# Patient Record
Sex: Female | Born: 1945 | Race: Black or African American | Hispanic: No | Smoking: Never smoker
Health system: Southern US, Community
[De-identification: ages and names within clinical notes are randomized; demographics above are authoritative.]

## PROBLEM LIST (undated history)

## (undated) DIAGNOSIS — Z7729 Contact with and (suspected ) exposure to other hazardous substances: Secondary | ICD-10-CM

## (undated) DIAGNOSIS — I10 Essential (primary) hypertension: Secondary | ICD-10-CM

## (undated) DIAGNOSIS — I4510 Unspecified right bundle-branch block: Secondary | ICD-10-CM

## (undated) DIAGNOSIS — F32A Depression, unspecified: Secondary | ICD-10-CM

## (undated) DIAGNOSIS — F329 Major depressive disorder, single episode, unspecified: Secondary | ICD-10-CM

## (undated) DIAGNOSIS — C539 Malignant neoplasm of cervix uteri, unspecified: Secondary | ICD-10-CM

## (undated) DIAGNOSIS — F419 Anxiety disorder, unspecified: Secondary | ICD-10-CM

## (undated) DIAGNOSIS — R42 Dizziness and giddiness: Secondary | ICD-10-CM

## (undated) HISTORY — DX: Essential (primary) hypertension: I10

## (undated) HISTORY — DX: Depression, unspecified: F32.A

## (undated) HISTORY — PX: WISDOM TOOTH EXTRACTION: SHX21

## (undated) HISTORY — DX: Contact with and (suspected) exposure to other hazardous substances: Z77.29

## (undated) HISTORY — DX: Major depressive disorder, single episode, unspecified: F32.9

## (undated) HISTORY — DX: Malignant neoplasm of cervix uteri, unspecified: C53.9

## (undated) HISTORY — DX: Unspecified right bundle-branch block: I45.10

## (undated) HISTORY — PX: ABDOMINAL HYSTERECTOMY: SHX81

## (undated) HISTORY — DX: Dizziness and giddiness: R42

---

## 2000-05-03 ENCOUNTER — Encounter: Payer: Self-pay | Admitting: Emergency Medicine

## 2000-05-03 ENCOUNTER — Emergency Department (HOSPITAL_COMMUNITY): Admission: EM | Admit: 2000-05-03 | Discharge: 2000-05-03 | Payer: Self-pay | Admitting: Emergency Medicine

## 2000-05-05 ENCOUNTER — Encounter: Admission: RE | Admit: 2000-05-05 | Discharge: 2000-05-05 | Payer: Self-pay | Admitting: Hematology and Oncology

## 2000-05-08 ENCOUNTER — Emergency Department (HOSPITAL_COMMUNITY): Admission: EM | Admit: 2000-05-08 | Discharge: 2000-05-08 | Payer: Self-pay | Admitting: *Deleted

## 2000-05-08 ENCOUNTER — Encounter: Payer: Self-pay | Admitting: Emergency Medicine

## 2000-06-01 ENCOUNTER — Encounter: Admission: RE | Admit: 2000-06-01 | Discharge: 2000-06-01 | Payer: Self-pay | Admitting: Internal Medicine

## 2000-06-03 ENCOUNTER — Ambulatory Visit (HOSPITAL_COMMUNITY): Admission: RE | Admit: 2000-06-03 | Discharge: 2000-06-03 | Payer: Self-pay | Admitting: *Deleted

## 2000-06-27 ENCOUNTER — Encounter: Admission: RE | Admit: 2000-06-27 | Discharge: 2000-06-27 | Payer: Self-pay | Admitting: Internal Medicine

## 2000-07-22 ENCOUNTER — Emergency Department (HOSPITAL_COMMUNITY): Admission: EM | Admit: 2000-07-22 | Discharge: 2000-07-22 | Payer: Self-pay | Admitting: Emergency Medicine

## 2000-08-26 ENCOUNTER — Encounter: Admission: RE | Admit: 2000-08-26 | Discharge: 2000-08-26 | Payer: Self-pay | Admitting: Internal Medicine

## 2000-09-14 ENCOUNTER — Encounter: Admission: RE | Admit: 2000-09-14 | Discharge: 2000-09-14 | Payer: Self-pay | Admitting: Internal Medicine

## 2000-09-14 ENCOUNTER — Ambulatory Visit (HOSPITAL_COMMUNITY): Admission: RE | Admit: 2000-09-14 | Discharge: 2000-09-14 | Payer: Self-pay | Admitting: Internal Medicine

## 2000-09-19 ENCOUNTER — Emergency Department (HOSPITAL_COMMUNITY): Admission: EM | Admit: 2000-09-19 | Discharge: 2000-09-19 | Payer: Self-pay | Admitting: Emergency Medicine

## 2000-09-19 ENCOUNTER — Encounter: Payer: Self-pay | Admitting: *Deleted

## 2000-10-18 ENCOUNTER — Encounter: Admission: RE | Admit: 2000-10-18 | Discharge: 2000-10-18 | Payer: Self-pay | Admitting: Obstetrics & Gynecology

## 2000-10-20 ENCOUNTER — Ambulatory Visit (HOSPITAL_COMMUNITY): Admission: RE | Admit: 2000-10-20 | Discharge: 2000-10-20 | Payer: Self-pay | Admitting: Internal Medicine

## 2000-11-23 ENCOUNTER — Encounter: Admission: RE | Admit: 2000-11-23 | Discharge: 2000-11-23 | Payer: Self-pay

## 2000-12-19 ENCOUNTER — Encounter: Admission: RE | Admit: 2000-12-19 | Discharge: 2000-12-19 | Payer: Self-pay | Admitting: Internal Medicine

## 2001-04-19 ENCOUNTER — Encounter: Admission: RE | Admit: 2001-04-19 | Discharge: 2001-04-19 | Payer: Self-pay | Admitting: Internal Medicine

## 2001-05-11 ENCOUNTER — Encounter: Admission: RE | Admit: 2001-05-11 | Discharge: 2001-05-11 | Payer: Self-pay | Admitting: Internal Medicine

## 2001-06-02 ENCOUNTER — Encounter: Admission: RE | Admit: 2001-06-02 | Discharge: 2001-06-02 | Payer: Self-pay | Admitting: Internal Medicine

## 2001-06-09 ENCOUNTER — Ambulatory Visit (HOSPITAL_COMMUNITY): Admission: RE | Admit: 2001-06-09 | Discharge: 2001-06-09 | Payer: Self-pay | Admitting: Internal Medicine

## 2001-09-28 ENCOUNTER — Encounter: Admission: RE | Admit: 2001-09-28 | Discharge: 2001-09-28 | Payer: Self-pay | Admitting: Internal Medicine

## 2001-10-19 ENCOUNTER — Encounter: Admission: RE | Admit: 2001-10-19 | Discharge: 2001-10-19 | Payer: Self-pay | Admitting: Internal Medicine

## 2002-01-17 ENCOUNTER — Encounter: Admission: RE | Admit: 2002-01-17 | Discharge: 2002-01-17 | Payer: Self-pay | Admitting: Internal Medicine

## 2002-05-24 ENCOUNTER — Encounter: Admission: RE | Admit: 2002-05-24 | Discharge: 2002-05-24 | Payer: Self-pay | Admitting: Internal Medicine

## 2002-05-24 ENCOUNTER — Encounter: Payer: Self-pay | Admitting: Cardiology

## 2002-05-24 ENCOUNTER — Observation Stay (HOSPITAL_COMMUNITY): Admission: AD | Admit: 2002-05-24 | Discharge: 2002-05-25 | Payer: Self-pay | Admitting: Internal Medicine

## 2002-05-25 ENCOUNTER — Encounter: Payer: Self-pay | Admitting: Cardiology

## 2002-10-19 ENCOUNTER — Encounter: Admission: RE | Admit: 2002-10-19 | Discharge: 2002-10-19 | Payer: Self-pay | Admitting: Internal Medicine

## 2002-10-25 ENCOUNTER — Emergency Department (HOSPITAL_COMMUNITY): Admission: EM | Admit: 2002-10-25 | Discharge: 2002-10-25 | Payer: Self-pay | Admitting: Emergency Medicine

## 2002-10-31 ENCOUNTER — Encounter: Payer: Self-pay | Admitting: Internal Medicine

## 2002-10-31 ENCOUNTER — Encounter: Admission: RE | Admit: 2002-10-31 | Discharge: 2002-10-31 | Payer: Self-pay | Admitting: Internal Medicine

## 2002-10-31 ENCOUNTER — Ambulatory Visit (HOSPITAL_COMMUNITY): Admission: RE | Admit: 2002-10-31 | Discharge: 2002-10-31 | Payer: Self-pay | Admitting: Internal Medicine

## 2003-01-30 ENCOUNTER — Encounter: Admission: RE | Admit: 2003-01-30 | Discharge: 2003-01-30 | Payer: Self-pay | Admitting: Internal Medicine

## 2003-04-11 ENCOUNTER — Emergency Department (HOSPITAL_COMMUNITY): Admission: EM | Admit: 2003-04-11 | Discharge: 2003-04-11 | Payer: Self-pay | Admitting: Emergency Medicine

## 2003-05-09 ENCOUNTER — Encounter: Admission: RE | Admit: 2003-05-09 | Discharge: 2003-05-09 | Payer: Self-pay | Admitting: Internal Medicine

## 2003-07-03 ENCOUNTER — Encounter: Admission: RE | Admit: 2003-07-03 | Discharge: 2003-07-03 | Payer: Self-pay | Admitting: Internal Medicine

## 2003-07-18 ENCOUNTER — Encounter: Admission: RE | Admit: 2003-07-18 | Discharge: 2003-07-18 | Payer: Self-pay | Admitting: Internal Medicine

## 2003-07-25 ENCOUNTER — Ambulatory Visit (HOSPITAL_COMMUNITY): Admission: RE | Admit: 2003-07-25 | Discharge: 2003-07-25 | Payer: Self-pay | Admitting: Internal Medicine

## 2003-08-20 ENCOUNTER — Encounter: Admission: RE | Admit: 2003-08-20 | Discharge: 2003-08-20 | Payer: Self-pay | Admitting: Internal Medicine

## 2003-09-09 ENCOUNTER — Encounter: Admission: RE | Admit: 2003-09-09 | Discharge: 2003-09-09 | Payer: Self-pay | Admitting: Internal Medicine

## 2003-12-30 ENCOUNTER — Encounter: Admission: RE | Admit: 2003-12-30 | Discharge: 2003-12-30 | Payer: Self-pay | Admitting: Internal Medicine

## 2004-06-08 ENCOUNTER — Encounter: Admission: RE | Admit: 2004-06-08 | Discharge: 2004-06-08 | Payer: Self-pay | Admitting: Internal Medicine

## 2004-06-10 ENCOUNTER — Encounter: Admission: RE | Admit: 2004-06-10 | Discharge: 2004-06-10 | Payer: Self-pay | Admitting: Internal Medicine

## 2004-06-24 ENCOUNTER — Ambulatory Visit: Payer: Self-pay | Admitting: Internal Medicine

## 2004-08-13 ENCOUNTER — Emergency Department (HOSPITAL_COMMUNITY): Admission: EM | Admit: 2004-08-13 | Discharge: 2004-08-13 | Payer: Self-pay | Admitting: Emergency Medicine

## 2004-08-19 ENCOUNTER — Ambulatory Visit: Payer: Self-pay | Admitting: Family Medicine

## 2004-08-20 ENCOUNTER — Ambulatory Visit: Payer: Self-pay | Admitting: Family Medicine

## 2004-09-10 ENCOUNTER — Ambulatory Visit: Payer: Self-pay | Admitting: Family Medicine

## 2004-09-22 ENCOUNTER — Ambulatory Visit (HOSPITAL_COMMUNITY): Admission: RE | Admit: 2004-09-22 | Discharge: 2004-09-22 | Payer: Self-pay | Admitting: Internal Medicine

## 2004-09-22 ENCOUNTER — Ambulatory Visit: Payer: Self-pay | Admitting: *Deleted

## 2004-10-01 ENCOUNTER — Ambulatory Visit: Payer: Self-pay | Admitting: Family Medicine

## 2004-10-22 ENCOUNTER — Ambulatory Visit: Payer: Self-pay | Admitting: Family Medicine

## 2004-11-03 ENCOUNTER — Ambulatory Visit: Payer: Self-pay | Admitting: Internal Medicine

## 2004-12-08 ENCOUNTER — Ambulatory Visit: Payer: Self-pay | Admitting: Internal Medicine

## 2005-04-20 ENCOUNTER — Ambulatory Visit: Payer: Self-pay | Admitting: Internal Medicine

## 2005-05-21 ENCOUNTER — Ambulatory Visit (HOSPITAL_COMMUNITY): Admission: RE | Admit: 2005-05-21 | Discharge: 2005-05-21 | Payer: Self-pay | Admitting: Family Medicine

## 2005-05-21 ENCOUNTER — Ambulatory Visit: Payer: Self-pay | Admitting: Family Medicine

## 2005-06-10 ENCOUNTER — Ambulatory Visit: Payer: Self-pay | Admitting: Family Medicine

## 2005-06-18 ENCOUNTER — Ambulatory Visit: Payer: Self-pay | Admitting: Internal Medicine

## 2005-07-30 ENCOUNTER — Ambulatory Visit: Payer: Self-pay | Admitting: Internal Medicine

## 2005-08-09 ENCOUNTER — Ambulatory Visit: Payer: Self-pay | Admitting: Internal Medicine

## 2005-09-28 ENCOUNTER — Ambulatory Visit (HOSPITAL_COMMUNITY): Admission: RE | Admit: 2005-09-28 | Discharge: 2005-09-28 | Payer: Self-pay | Admitting: Family Medicine

## 2005-10-27 ENCOUNTER — Ambulatory Visit: Payer: Self-pay | Admitting: Family Medicine

## 2005-11-08 ENCOUNTER — Ambulatory Visit: Payer: Self-pay | Admitting: Family Medicine

## 2005-11-08 ENCOUNTER — Emergency Department (HOSPITAL_COMMUNITY): Admission: EM | Admit: 2005-11-08 | Discharge: 2005-11-08 | Payer: Self-pay | Admitting: Emergency Medicine

## 2005-11-18 ENCOUNTER — Emergency Department (HOSPITAL_COMMUNITY): Admission: EM | Admit: 2005-11-18 | Discharge: 2005-11-18 | Payer: Self-pay | Admitting: Emergency Medicine

## 2005-11-22 ENCOUNTER — Emergency Department (HOSPITAL_COMMUNITY): Admission: EM | Admit: 2005-11-22 | Discharge: 2005-11-22 | Payer: Self-pay | Admitting: Family Medicine

## 2005-12-06 ENCOUNTER — Emergency Department (HOSPITAL_COMMUNITY): Admission: EM | Admit: 2005-12-06 | Discharge: 2005-12-06 | Payer: Self-pay | Admitting: Family Medicine

## 2005-12-07 ENCOUNTER — Ambulatory Visit: Payer: Self-pay | Admitting: Family Medicine

## 2006-10-13 ENCOUNTER — Ambulatory Visit (HOSPITAL_COMMUNITY): Admission: RE | Admit: 2006-10-13 | Discharge: 2006-10-13 | Payer: Self-pay | Admitting: Family Medicine

## 2007-06-26 DIAGNOSIS — E785 Hyperlipidemia, unspecified: Secondary | ICD-10-CM | POA: Insufficient documentation

## 2007-06-26 DIAGNOSIS — Z8679 Personal history of other diseases of the circulatory system: Secondary | ICD-10-CM | POA: Insufficient documentation

## 2007-06-26 DIAGNOSIS — Z9079 Acquired absence of other genital organ(s): Secondary | ICD-10-CM | POA: Insufficient documentation

## 2007-06-26 HISTORY — DX: Hyperlipidemia, unspecified: E78.5

## 2007-07-16 DIAGNOSIS — I10 Essential (primary) hypertension: Secondary | ICD-10-CM

## 2007-07-16 DIAGNOSIS — J309 Allergic rhinitis, unspecified: Secondary | ICD-10-CM | POA: Insufficient documentation

## 2007-07-16 DIAGNOSIS — F419 Anxiety disorder, unspecified: Secondary | ICD-10-CM

## 2007-07-16 DIAGNOSIS — F329 Major depressive disorder, single episode, unspecified: Secondary | ICD-10-CM

## 2007-07-16 DIAGNOSIS — F32A Depression, unspecified: Secondary | ICD-10-CM | POA: Insufficient documentation

## 2007-07-16 HISTORY — DX: Essential (primary) hypertension: I10

## 2007-07-16 HISTORY — DX: Allergic rhinitis, unspecified: J30.9

## 2007-07-16 HISTORY — DX: Major depressive disorder, single episode, unspecified: F32.9

## 2007-09-18 ENCOUNTER — Emergency Department (HOSPITAL_COMMUNITY): Admission: EM | Admit: 2007-09-18 | Discharge: 2007-09-18 | Payer: Self-pay | Admitting: Family Medicine

## 2007-12-29 ENCOUNTER — Ambulatory Visit (HOSPITAL_COMMUNITY): Admission: RE | Admit: 2007-12-29 | Discharge: 2007-12-29 | Payer: Self-pay | Admitting: Family Medicine

## 2009-02-11 ENCOUNTER — Emergency Department (HOSPITAL_COMMUNITY): Admission: EM | Admit: 2009-02-11 | Discharge: 2009-02-11 | Payer: Self-pay | Admitting: Family Medicine

## 2009-10-15 ENCOUNTER — Emergency Department (HOSPITAL_COMMUNITY): Admission: EM | Admit: 2009-10-15 | Discharge: 2009-10-15 | Payer: Self-pay | Admitting: Family Medicine

## 2009-12-03 ENCOUNTER — Emergency Department (HOSPITAL_BASED_OUTPATIENT_CLINIC_OR_DEPARTMENT_OTHER): Admission: EM | Admit: 2009-12-03 | Discharge: 2009-12-03 | Payer: Self-pay | Admitting: Emergency Medicine

## 2009-12-11 ENCOUNTER — Encounter (INDEPENDENT_AMBULATORY_CARE_PROVIDER_SITE_OTHER): Payer: Self-pay | Admitting: *Deleted

## 2009-12-15 ENCOUNTER — Encounter: Admission: RE | Admit: 2009-12-15 | Discharge: 2009-12-15 | Payer: Self-pay | Admitting: Internal Medicine

## 2010-01-07 ENCOUNTER — Emergency Department (HOSPITAL_COMMUNITY): Admission: EM | Admit: 2010-01-07 | Discharge: 2010-01-07 | Payer: Self-pay | Admitting: Family Medicine

## 2010-08-27 ENCOUNTER — Ambulatory Visit (HOSPITAL_COMMUNITY): Admission: RE | Admit: 2010-08-27 | Discharge: 2010-08-27 | Payer: Self-pay | Admitting: Internal Medicine

## 2010-11-10 NOTE — Letter (Signed)
Summary: New Patient letter  [facility]  [address] [place], [place] [address]   Phone: [phone]  Fax: [phone]       12/11/2009 MRN: [id number]  [facility] [address] [place], [place]  [address]  Dear Ms. Till,  Welcome to the Gastroenterology Division at [facility].    You are scheduled to see Dr. Jarold Motto on 01/09/2010 at 8:30AM on the 3rd floor at [facility], [address]  We ask that you try to arrive at our office 15 minutes prior to your appointment time to allow for check-in.  We would like you to complete the enclosed self-administered evaluation form prior to your visit and bring it with you on the day of your appointment.  We will review it with you.  Also, please bring a complete list of all your medications or, if you prefer, bring the medication bottles and we will list them.  Please bring your insurance card so that we may make a copy of it.  If your insurance requires a referral to see a specialist, please bring your referral form from your primary care physician.  Co-payments are due at the time of your visit and may be paid by cash, check or credit card.     Your office visit will consist of a consult with your physician (includes a physical exam), any laboratory testing he/she may order, scheduling of any necessary diagnostic testing (e.g. x-ray, ultrasound, CT-scan), and scheduling of a procedure (e.g. Endoscopy, Colonoscopy) if required.  Please allow enough time on your schedule to allow for any/all of these possibilities.    If you cannot keep your appointment, please call [phone] to cancel or reschedule prior to your appointment date.  This allows [name] the opportunity to schedule an appointment for another patient in need of care.  If you do not cancel or reschedule by 5 p.m. the business day prior to your appointment date, you will be charged a $50.00 late cancellation/no-show fee.    Thank you for choosing  [facility] for your medical needs.  We appreciate the opportunity to care for you.  Please visit [name] at our website  to learn more about our practice.                     Sincerely,                                                             The Gastroenterology Division

## 2010-12-30 ENCOUNTER — Other Ambulatory Visit: Payer: Self-pay | Admitting: Occupational Medicine

## 2010-12-30 ENCOUNTER — Ambulatory Visit: Payer: Self-pay

## 2010-12-30 DIAGNOSIS — R52 Pain, unspecified: Secondary | ICD-10-CM

## 2011-01-19 LAB — POCT I-STAT, CHEM 8
BUN: 13 mg/dL (ref 6–23)
Calcium, Ion: 1.23 mmol/L (ref 1.12–1.32)
Chloride: 105 mEq/L (ref 96–112)
Creatinine, Ser: 1.2 mg/dL (ref 0.4–1.2)
Glucose, Bld: 91 mg/dL (ref 70–99)
HCT: 39 % (ref 36.0–46.0)
Hemoglobin: 13.3 g/dL (ref 12.0–15.0)
Potassium: 3.8 mEq/L (ref 3.5–5.1)
Sodium: 140 mEq/L (ref 135–145)
TCO2: 26 mmol/L (ref 0–100)

## 2011-01-19 LAB — TSH: TSH: 1.081 u[IU]/mL (ref 0.350–4.500)

## 2011-02-26 NOTE — Discharge Summary (Signed)
NAMEARVELLA, Margaret Torres                         ACCOUNT NO.:  [id number]   MEDICAL RECORD NO.:  [id number]                   PATIENT TYPE:  INP   LOCATION:  2017                                 FACILITY:  [facility]   PHYSICIAN:  Madaline Guthrie, M.D.                 DATE OF BIRTH:  September 20, 1946   DATE OF ADMISSION:  05/24/2002  DATE OF DISCHARGE:  05/25/2002                                 DISCHARGE SUMMARY   HOME ADDRESS:  [address] [place], [place] [address].  Phone  [phone].   DISCHARGE DIAGNOSIS:  Gastroesophageal reflux disease.   ADMISSION DIAGNOSES:  1. Chest pain.  2. Hypertension.  3. Gastroesophageal reflux disease.   DISCHARGE MEDICATIONS:  1. Protonix one p.o. q.d.  2. Amitriptyline one p.o. q.h.s.  3. Xanax one p.o. q.12h. p.r.n.  4. Nitroglycerin tablets one p.o. q.50m. x3 p.r.n.   FOLLOWUP APPOINTMENTS:  The patient scheduled for followup for Cardiolite on  Monday, June 04, 2002 at 9:30 a.m. at [facility].  The patient  also scheduled for followup with Dr. Bettye Boeck July 06, 2002 at 2 p.m.   CONDITION ON DISCHARGE:  The patient's condition good at discharge.   PROCEDURE:  The patient had a cardiac echo on May 25, 2002.  This echo  showed normal left ventricular systolic function with left ejection fraction  estimated at 55-65%.  It also showed mitral regurgitation.   CHIEF COMPLAINT:  Chest pain.   HISTORY OF PRESENT ILLNESS:  The patient is a 65 year old African-American  female with a 17-year of hypertension that came into the [facility] today  complaining of fatigue for over 1 week and sharp chest pain after exertion  relieved by rest that started yesterday.  The pain radiated to her left arm  and was associated with diaphoresis.  No nausea, no vomiting.  She says also  that for the past week she felt abdominal pain that was worse with food  intake, associated with bloating, not relieved by proton pump inhibitors.  Denies any  diarrhea or constipation.   ALLERGIES:  No known drug allergies.   MEDICATIONS:  1. Hydrochlorothiazide 25 mg p.o. q.d.  2. Aciphex 20 mg p.o. p.r.n.  3. Paxil 20 mg p.r.n.  4. Ativan 5 mg p.r.n.  5. Liquid Kay Ciel.   PAST MEDICAL HISTORY:  1. Hypertension.  2. Anxiety.  3. Total hysterectomy/bilateral oophorectomy in 1987 due to cervical cancer     in situ.  4. Depression.  5. GERD.  6. History of benzodiazepine abuse.   SOCIAL HISTORY:  The patient denies tobacco, alcohol, cocaine, other drugs.  She is widowed and divorced.  She works part time in [employer] and she  lives alone currently.  Her dog ran away 2 weeks ago.   FAMILY HISTORY:  Mother died of cervical cancer at 74.  Father died from  crime.  Siblings six brothers, all have hypertension.  She  has four  children, all of whom are healthy.   REVIEW OF SYSTEMS:  Positive for recent fatigue, weight gain, chest pain,  palpitations, hematochezia, epigastric pain, bloating, dizziness, depression  and anxiety.  Denies fevers, sore throat, vision changes, dyspnea, edema,  syncope, sputum, wheezing, nausea, vomiting, constipation, diarrhea,  dysphagia, dysuria, hematuria, polyuria, polydipsia, joint pain, weakness,  numbness, headache, blurry vision, suicidal ideation.   PHYSICAL EXAMINATION:  VITAL SIGNS: Temperature 98.1, BP 132/76, pulse 56,  respirations 16, O2 saturation 100%.  GENERAL: Pleasant, lying in bed, very anxious and oriented.  EYES: Pupils equal, round, reactive to light.  Extraocular muscles intact.  ENT: No nasal discharge.  No oropharyngeal erythema.  NECK: No JVD.  No carotid bruits.  RESPIRATORY: Clear to auscultation bilaterally.  CARDIOVASCULAR: Regular rate and rhythm, S1, S2 normal; no murmurs.  GI: Obese abdomen, bowel sounds positive, nontender.  EXTREMITIES: No edema, pulses 2+.  GU: No vaginal discharge.  SKIN: No rash.  LYMPHATICS: No lymphadenopathy.  MUSCULOSKELETAL: Intact.   NEUROLOGIC: Cranial nerves II-XII okay.  Normal strength.  PSYCHIATRIC: No hallucinations, no delusions, but clearly anxious and  depressed.   LABORATORIES:  Cardiac enzymes negative.  EKG showed sinus bradycardia,  negative T waves in V1-V4, stable from earlier EKG in 2001.  Urine drug  screen was negative.  Urinalysis positive for blood, 7-10 white blood cells,  rare bacteria.  Chem-7: 139, 3.1, 105, 28, 14, 0.8, 131.  CBC showed 6.3  white cells, 301 platelets, 11.9 hemoglobin, 35.8 hematocrit, MCV is 89.4.  Anion gap was 6, calcium 9.4, protein 6.6, albumin 3.7, bilirubin 0.9, alk  phos 54, SGOT 20, SGPT 22.   ASSESSMENT AND PLAN:  1. Chest discomfort.  Possible angina versus gastritis, gastroesophageal     reflux disease versus anxiety versus parietal pain.  No acute EKG     changes.  Negative set of enzymes.  Probability of having significant     coronary artery disease is 15% given her age, sex, normal lipids and the     fact that she is not a smoker.  Symptoms improved with gastrointestinal     cocktail.  We will continue to monitor and watch for a recurrence.  She     will benefit from chronic SSIR use.  2. Hypertension.  Continue hydrochlorothiazide and add in metoprolol.  We     will monitor blood pressure levels.  3. Abdominal pain with bloating.  Might be gastritis, biliary causes.  She     is at high risk for having gallstones.  We will consider ultrasound     abdomen.  We will check stool for occult blood, if positive consider     outpatient EGD.  4. Weakness.  Possible causes decreased potassium, we will check TSH,     depression, chronic fatigue, PMR.  5. Decreased potassium secondary to hydrochlorothiazide.  We will replete.  6. We will recheck her lipid panel.  7. Mild normocytic anemia.  We will check stool for occult blood.  She will     need outpatient colonoscopy.  8. Obesity.  Encouraged to loose weight, diet counseling, group support.   HOSPITAL COURSE: 1.  Chest pain.  Pain mostly resolved without any radiation of the pain from     the substernal region by the next day.  The patient described the pain as     a burning sensation running the length of her esophagus most likely due     to GERD and will be treated outpatient  PPI.  2. Hypertension.  Blood pressure is stable as inpatient.  3. Abdominal pain and bloating secondary to problem #1.  We will treat with     PPI as outpatient.  4. Depression/anxiety.  The patient unable to take SSRI as outpatient due to     cost.  We have sent her home with amitriptyline.   DISCHARGE VITALS:  Blood pressure 132/76, temperature 98.1, respirations 18,  pulse of 64.   PENDING LABORATORIES:  No labs were pending at time of discharge.     Ilda Basset, MS-IV                          Madaline Guthrie, M.D.    JZ/MEDQ  D:  05/26/2002  T:  05/30/2002  Job:  [id number]   cc:   [facility]

## 2011-03-16 ENCOUNTER — Ambulatory Visit: Payer: Worker's Compensation | Attending: Occupational Medicine | Admitting: Occupational Therapy

## 2011-03-16 DIAGNOSIS — R279 Unspecified lack of coordination: Secondary | ICD-10-CM | POA: Insufficient documentation

## 2011-03-16 DIAGNOSIS — M6281 Muscle weakness (generalized): Secondary | ICD-10-CM | POA: Insufficient documentation

## 2011-03-16 DIAGNOSIS — IMO0001 Reserved for inherently not codable concepts without codable children: Secondary | ICD-10-CM | POA: Insufficient documentation

## 2011-03-18 ENCOUNTER — Ambulatory Visit: Payer: Worker's Compensation | Admitting: Occupational Therapy

## 2011-03-24 ENCOUNTER — Ambulatory Visit: Payer: Worker's Compensation | Admitting: Occupational Therapy

## 2011-03-29 ENCOUNTER — Ambulatory Visit: Payer: Worker's Compensation | Attending: Family Medicine | Admitting: Occupational Therapy

## 2011-03-29 DIAGNOSIS — IMO0001 Reserved for inherently not codable concepts without codable children: Secondary | ICD-10-CM | POA: Insufficient documentation

## 2011-03-29 DIAGNOSIS — M6281 Muscle weakness (generalized): Secondary | ICD-10-CM | POA: Insufficient documentation

## 2011-03-29 DIAGNOSIS — R279 Unspecified lack of coordination: Secondary | ICD-10-CM | POA: Insufficient documentation

## 2011-03-30 ENCOUNTER — Ambulatory Visit: Payer: Worker's Compensation | Admitting: Occupational Therapy

## 2011-04-06 ENCOUNTER — Ambulatory Visit: Payer: Worker's Compensation | Admitting: Occupational Therapy

## 2011-04-07 ENCOUNTER — Encounter: Payer: Self-pay | Admitting: Occupational Therapy

## 2011-04-13 ENCOUNTER — Ambulatory Visit: Payer: Worker's Compensation | Attending: Family Medicine | Admitting: Occupational Therapy

## 2011-04-13 DIAGNOSIS — M6281 Muscle weakness (generalized): Secondary | ICD-10-CM | POA: Insufficient documentation

## 2011-04-13 DIAGNOSIS — R279 Unspecified lack of coordination: Secondary | ICD-10-CM | POA: Insufficient documentation

## 2011-04-13 DIAGNOSIS — IMO0001 Reserved for inherently not codable concepts without codable children: Secondary | ICD-10-CM | POA: Insufficient documentation

## 2011-04-15 ENCOUNTER — Encounter: Payer: Self-pay | Admitting: Occupational Therapy

## 2012-03-27 LAB — HM COLONOSCOPY: HM Colonoscopy: NORMAL

## 2013-05-29 ENCOUNTER — Other Ambulatory Visit: Payer: Self-pay | Admitting: Cardiology

## 2013-05-29 DIAGNOSIS — K909 Intestinal malabsorption, unspecified: Secondary | ICD-10-CM

## 2013-05-29 DIAGNOSIS — K5900 Constipation, unspecified: Secondary | ICD-10-CM

## 2013-06-01 ENCOUNTER — Ambulatory Visit
Admission: RE | Admit: 2013-06-01 | Discharge: 2013-06-01 | Disposition: A | Discharge status: Home / Self Care | Payer: Medicare HMO | Source: Ambulatory Visit | Attending: Cardiology | Admitting: Cardiology

## 2013-06-01 DIAGNOSIS — K5900 Constipation, unspecified: Secondary | ICD-10-CM

## 2013-06-01 DIAGNOSIS — K909 Intestinal malabsorption, unspecified: Secondary | ICD-10-CM

## 2013-06-30 ENCOUNTER — Emergency Department (INDEPENDENT_AMBULATORY_CARE_PROVIDER_SITE_OTHER)
Admission: EM | Admit: 2013-06-30 | Discharge: 2013-06-30 | Disposition: A | Discharge status: Home / Self Care | Payer: Medicare HMO | Source: Home / Self Care | Attending: Family Medicine | Admitting: Family Medicine

## 2013-06-30 ENCOUNTER — Encounter (HOSPITAL_COMMUNITY): Payer: Self-pay | Admitting: *Deleted

## 2013-06-30 DIAGNOSIS — N390 Urinary tract infection, site not specified: Secondary | ICD-10-CM

## 2013-06-30 HISTORY — DX: Essential (primary) hypertension: I10

## 2013-06-30 LAB — POCT URINALYSIS DIP (DEVICE)
Bilirubin Urine: NEGATIVE
Glucose, UA: NEGATIVE mg/dL
Ketones, ur: NEGATIVE mg/dL
Nitrite: NEGATIVE
Protein, ur: NEGATIVE mg/dL
Specific Gravity, Urine: 1.015 (ref 1.005–1.030)
Urobilinogen, UA: 0.2 mg/dL (ref 0.0–1.0)
pH: 7 (ref 5.0–8.0)

## 2013-06-30 MED ORDER — CEPHALEXIN 500 MG PO CAPS: 500.0000 mg | ORAL_CAPSULE | Freq: Four times a day (QID) | ORAL | Status: DC

## 2013-06-30 NOTE — ED Provider Notes (Signed)
CSN: [id number]     Arrival date & time 06/30/13  0901 History   None    Chief Complaint  Patient presents with  . Urinary Frequency   (Consider location/radiation/quality/duration/timing/severity/associated sxs/prior Treatment) Patient is a 67 y.o. female presenting with frequency. The history is provided by the patient.  Urinary Frequency This is a recurrent problem. The current episode started more than 2 days ago (seen by dr spruill last mo for same and treated but sx recurred,). The problem has been gradually worsening. Associated symptoms include abdominal pain.    Past Medical History  Diagnosis Date  . Hypertension    History reviewed. No pertinent past surgical history. History reviewed. No pertinent family history. History  Substance Use Topics  . Smoking status: Never Smoker   . Smokeless tobacco: Not on file  . Alcohol Use: No   OB History   Grav Para Term Preterm Abortions TAB SAB Ect Mult Living                 Review of Systems  Constitutional: Negative.   Gastrointestinal: Positive for nausea and abdominal pain. Negative for vomiting.  Genitourinary: Positive for dysuria, urgency, frequency and difficulty urinating. Negative for flank pain, vaginal bleeding and vaginal discharge.    Allergies  Review of patient's allergies indicates not on file.  Home Medications   Current Outpatient Rx  Name  Route  Sig  Dispense  Refill  . amLODipine (NORVASC) 10 MG tablet   Oral   Take 10 mg by mouth daily.         . clonazePAM (KLONOPIN) 0.5 MG tablet   Oral   Take 0.5 mg by mouth 2 (two) times daily as needed for anxiety.         . cephALEXin (KEFLEX) 500 MG capsule   Oral   Take 1 capsule (500 mg total) by mouth 4 (four) times daily. Take all of medicine and drink lots of fluids   20 capsule   0    BP 155/90  Pulse 67  Temp(Src) 98.4 F (36.9 C) (Oral)  Resp 18  SpO2 99%  LMP 06/30/2013 Physical Exam  Nursing note and vitals  reviewed. Constitutional: She is oriented to person, place, and time. She appears well-developed and well-nourished.  Abdominal: Soft. Bowel sounds are normal. She exhibits no distension and no mass. There is no tenderness. There is no rebound and no guarding.  Neurological: She is alert and oriented to person, place, and time.  Skin: Skin is warm and dry.    ED Course  Procedures (including critical care time) Labs Review Labs Reviewed  POCT URINALYSIS DIP (DEVICE) - Abnormal; Notable for the following:    Hgb urine dipstick SMALL (*)    Leukocytes, UA TRACE (*)    All other components within normal limits   Imaging Review No results found.  MDM  U/a abnl.   Linna Hoff, MD 06/30/13 [phone]

## 2013-06-30 NOTE — ED Notes (Signed)
Pt  Reports  Symptoms  Of urinary  Frequency / discomfort             Off  And  On  X  3  Weeks       -  She  Reports  Was  Treated by  pcp         With  Anti  Biotics  Recently       - however continues  To have  Symptoms

## 2013-07-07 ENCOUNTER — Emergency Department (HOSPITAL_COMMUNITY)
Admission: EM | Admit: 2013-07-07 | Discharge: 2013-07-07 | Disposition: A | Discharge status: Home / Self Care | Payer: Medicare HMO | Attending: Emergency Medicine | Admitting: Emergency Medicine

## 2013-07-07 ENCOUNTER — Emergency Department (HOSPITAL_COMMUNITY)
Admission: EM | Admit: 2013-07-07 | Discharge: 2013-07-07 | Disposition: A | Discharge status: Home / Self Care | Payer: Medicare HMO | Source: Home / Self Care

## 2013-07-07 ENCOUNTER — Encounter (HOSPITAL_COMMUNITY): Payer: Self-pay | Admitting: Emergency Medicine

## 2013-07-07 DIAGNOSIS — Z79899 Other long term (current) drug therapy: Secondary | ICD-10-CM | POA: Insufficient documentation

## 2013-07-07 DIAGNOSIS — R350 Frequency of micturition: Secondary | ICD-10-CM | POA: Insufficient documentation

## 2013-07-07 DIAGNOSIS — M545 Low back pain, unspecified: Secondary | ICD-10-CM | POA: Insufficient documentation

## 2013-07-07 DIAGNOSIS — Z87440 Personal history of urinary (tract) infections: Secondary | ICD-10-CM | POA: Insufficient documentation

## 2013-07-07 DIAGNOSIS — R109 Unspecified abdominal pain: Secondary | ICD-10-CM | POA: Insufficient documentation

## 2013-07-07 DIAGNOSIS — I10 Essential (primary) hypertension: Secondary | ICD-10-CM | POA: Insufficient documentation

## 2013-07-07 LAB — CBC WITH DIFFERENTIAL/PLATELET
Basophils Absolute: 0 10*3/uL (ref 0.0–0.1)
Basophils Relative: 0 % (ref 0–1)
Eosinophils Absolute: 0.3 10*3/uL (ref 0.0–0.7)
Eosinophils Relative: 3 % (ref 0–5)
HCT: 31.5 % — ABNORMAL LOW (ref 36.0–46.0)
Hemoglobin: 11.2 g/dL — ABNORMAL LOW (ref 12.0–15.0)
Lymphocytes Relative: 38 % (ref 12–46)
Lymphs Abs: 3.2 10*3/uL (ref 0.7–4.0)
MCH: 30.7 pg (ref 26.0–34.0)
MCHC: 35.6 g/dL (ref 30.0–36.0)
MCV: 86.3 fL (ref 78.0–100.0)
Monocytes Absolute: 0.6 10*3/uL (ref 0.1–1.0)
Monocytes Relative: 7 % (ref 3–12)
Neutro Abs: 4.4 10*3/uL (ref 1.7–7.7)
Neutrophils Relative %: 51 % (ref 43–77)
Platelets: 293 10*3/uL (ref 150–400)
RBC: 3.65 MIL/uL — ABNORMAL LOW (ref 3.87–5.11)
RDW: 13.2 % (ref 11.5–15.5)
WBC: 8.5 10*3/uL (ref 4.0–10.5)

## 2013-07-07 LAB — URINALYSIS, ROUTINE W REFLEX MICROSCOPIC
Bilirubin Urine: NEGATIVE
Glucose, UA: NEGATIVE mg/dL
Ketones, ur: NEGATIVE mg/dL
Nitrite: NEGATIVE
Protein, ur: NEGATIVE mg/dL
Specific Gravity, Urine: 1.013 (ref 1.005–1.030)
Urobilinogen, UA: 0.2 mg/dL (ref 0.0–1.0)
pH: 6 (ref 5.0–8.0)

## 2013-07-07 LAB — URINE MICROSCOPIC-ADD ON

## 2013-07-07 LAB — BASIC METABOLIC PANEL
BUN: 20 mg/dL (ref 6–23)
CO2: 26 mEq/L (ref 19–32)
Calcium: 9 mg/dL (ref 8.4–10.5)
Chloride: 103 mEq/L (ref 96–112)
Creatinine, Ser: 0.98 mg/dL (ref 0.50–1.10)
GFR calc Af Amer: 67 mL/min — ABNORMAL LOW (ref >90)
GFR calc non Af Amer: 58 mL/min — ABNORMAL LOW (ref >90)
Glucose, Bld: 100 mg/dL — ABNORMAL HIGH (ref 70–99)
Potassium: 4 mEq/L (ref 3.5–5.1)
Sodium: 138 mEq/L (ref 135–145)

## 2013-07-07 MED ORDER — TRAMADOL HCL 50 MG PO TABS: 50.0000 mg | ORAL_TABLET | Freq: Four times a day (QID) | ORAL | Status: DC | PRN

## 2013-07-07 NOTE — ED Provider Notes (Signed)
CSN: [id number]     Arrival date & time 07/07/13  2022 History  This chart was scribed for non-physician practitioner Wynetta Emery, PA-C working with Gwyneth Sprout, MD by Danella Maiers, ED Scribe. This patient was seen in room TR08C/TR08C and the patient's care was started at 8:42 PM.   No chief complaint on file.  The history is provided by the patient. No language interpreter was used.   HPI Comments: Margaret Torres is a 67 y.o. female who presents to the Emergency Department complaining of frequent urination with associated lower back pain and lower abdominal pain described as "pressure" onset two weeks ago. She states she has to get up to use the bathroom at night every 30 minutes, much more than in the daytime. She was seen at [facility] one week ago, diagnosed with a UTI, and prescribed with an oral antibiotic (Keflex). She saw her PCP four days ago and he told her to finish the course of antibiotics. Pt states the antibiotics have not helped. She reports more frequent bowel movements due to the pressure pain- had 3 BMs last night, but the BMs are normal. She reports frequent thirst. She was tested for diabetes 6 months ago by her PCP and it was negative. She denies fever, emesis, diarrhea, change in appetite, vaginal bleeding, vaginal discharge.    PCP- Dr. Shana Chute  Past Medical History  Diagnosis Date  . Hypertension    History reviewed. No pertinent past surgical history. No family history on file. History  Substance Use Topics  . Smoking status: Never Smoker   . Smokeless tobacco: Not on file  . Alcohol Use: No   OB History   Grav Para Term Preterm Abortions TAB SAB Ect Mult Living                 Review of Systems A complete 10 system review of systems was obtained and all systems are negative except as noted in the HPI and PMH.   Allergies  Review of patient's allergies indicates no known allergies.  Home Medications   Current Outpatient Rx  Name  Route   Sig  Dispense  Refill  . amLODipine (NORVASC) 10 MG tablet   Oral   Take 10 mg by mouth daily.         . cephALEXin (KEFLEX) 500 MG capsule   Oral   Take 1 capsule (500 mg total) by mouth 4 (four) times daily. Take all of medicine and drink lots of fluids   20 capsule   0   . clonazePAM (KLONOPIN) 0.5 MG tablet   Oral   Take 0.5 mg by mouth 2 (two) times daily as needed for anxiety.          BP 141/66  Pulse 70  Temp(Src) 98.1 F (36.7 C) (Oral)  Resp 14  SpO2 97%  LMP 06/30/2013 Physical Exam  Nursing note and vitals reviewed. Constitutional: She is oriented to person, place, and time. She appears well-developed and well-nourished. No distress.  HENT:  Head: Normocephalic.  Mouth/Throat: Oropharynx is clear and moist.  Eyes: Conjunctivae and EOM are normal. Pupils are equal, round, and reactive to light.  Neck: Normal range of motion.  Cardiovascular: Normal rate, regular rhythm and intact distal pulses.   Pulmonary/Chest: Effort normal and breath sounds normal. No stridor. No respiratory distress. She has no wheezes. She has no rales. She exhibits no tenderness.  Abdominal: Soft. Bowel sounds are normal. She exhibits no distension and no mass. There is  no tenderness. There is no rebound and no guarding.  Genitourinary:  No CVA tenderness bilaterally  Musculoskeletal: Normal range of motion.  Neurological: She is alert and oriented to person, place, and time.  Psychiatric: She has a normal mood and affect.    ED Course  Procedures (including critical care time) Medications - No data to display  DIAGNOSTIC STUDIES: Oxygen Saturation is 97% on RA, normal by my interpretation.    COORDINATION OF CARE: 9:41 PM- Discussed treatment plan with pt which includes referral to gynecologist and pain meds. Pt agrees to plan.   Labs Review Labs Reviewed  URINALYSIS, ROUTINE W REFLEX MICROSCOPIC - Abnormal; Notable for the following:    Hgb urine dipstick SMALL (*)     Leukocytes, UA TRACE (*)    All other components within normal limits  CBC WITH DIFFERENTIAL - Abnormal; Notable for the following:    RBC 3.65 (*)    Hemoglobin 11.2 (*)    HCT 31.5 (*)    All other components within normal limits  BASIC METABOLIC PANEL - Abnormal; Notable for the following:    Glucose, Bld 100 (*)    GFR calc non Af Amer 58 (*)    GFR calc Af Amer 67 (*)    All other components within normal limits  URINE CULTURE  URINE MICROSCOPIC-ADD ON   Imaging Review No results found.  MDM  No diagnosis found. Filed Vitals:   07/07/13 2035 07/07/13 2215  BP: 141/66 149/83  Pulse: 70 77  Temp: 98.1 F (36.7 C) 98.6 F (37 C)  TempSrc: Oral Oral  Resp: 14   SpO2: 97% 97%     Margaret Torres is a 67 y.o. female with urinary frequency, recently finished a prescription for UTI. States it has not helped her. Abdominal exam is nonsurgical, she has no CVA tenderness to palpation. She states that she was tested by her primary care Dr. for diabetes 6 months ago and it was negative. Urinalysis is not consistent with infection, I will cultured. I think this is likely secondary to pelvic floor weakness, I asked her to follow with the urologist for urodynamic testing. I also advised her that she may want to visit with a OB/GYN. She has verbalized her understanding and we have discussed return precautions.  Pt is hemodynamically stable, appropriate for, and amenable to discharge at this time. Pt verbalized understanding and agrees with care plan. All questions answered. Outpatient follow-up and specific return precautions discussed.    I personally performed the services described in this documentation, which was scribed in my presence. The recorded information has been reviewed and is accurate.  Note: Portions of this report may have been transcribed using voice recognition software. Every effort was made to ensure accuracy; however, inadvertent computerized transcription errors may be  present     Wynetta Emery, PA-C 07/09/13 [phone]

## 2013-07-07 NOTE — ED Notes (Signed)
Pt. reports persistent urinary frequency with low back pain seen at [facility] last week diagnosed with UTI prescribed with oral antibiotic.

## 2013-07-10 LAB — URINE CULTURE: Colony Count: 50000

## 2013-07-11 ENCOUNTER — Other Ambulatory Visit (HOSPITAL_COMMUNITY): Payer: Self-pay | Admitting: Cardiology

## 2013-07-11 DIAGNOSIS — Z1231 Encounter for screening mammogram for malignant neoplasm of breast: Secondary | ICD-10-CM

## 2013-07-11 NOTE — ED Provider Notes (Signed)
Medical screening examination/treatment/procedure(s) were performed by non-physician practitioner and as supervising physician I was immediately available for consultation/collaboration.   Gwyneth Sprout, MD 07/11/13 2042

## 2013-07-12 ENCOUNTER — Ambulatory Visit (HOSPITAL_COMMUNITY)
Admission: RE | Admit: 2013-07-12 | Discharge: 2013-07-12 | Disposition: A | Discharge status: Home / Self Care | Payer: Medicare HMO | Source: Ambulatory Visit | Attending: Cardiology | Admitting: Cardiology

## 2013-07-12 DIAGNOSIS — Z1231 Encounter for screening mammogram for malignant neoplasm of breast: Secondary | ICD-10-CM | POA: Insufficient documentation

## 2013-09-18 ENCOUNTER — Emergency Department (INDEPENDENT_AMBULATORY_CARE_PROVIDER_SITE_OTHER): Payer: Medicare HMO

## 2013-09-18 ENCOUNTER — Emergency Department (HOSPITAL_COMMUNITY)
Admission: EM | Admit: 2013-09-18 | Discharge: 2013-09-18 | Disposition: A | Discharge status: Home / Self Care | Payer: Medicare HMO | Source: Home / Self Care

## 2013-09-18 ENCOUNTER — Encounter (HOSPITAL_COMMUNITY): Payer: Self-pay | Admitting: Emergency Medicine

## 2013-09-18 DIAGNOSIS — F13939 Sedative, hypnotic or anxiolytic use, unspecified with withdrawal, unspecified: Secondary | ICD-10-CM

## 2013-09-18 DIAGNOSIS — R141 Gas pain: Secondary | ICD-10-CM

## 2013-09-18 DIAGNOSIS — F13239 Sedative, hypnotic or anxiolytic dependence with withdrawal, unspecified: Secondary | ICD-10-CM

## 2013-09-18 DIAGNOSIS — F19239 Other psychoactive substance dependence with withdrawal, unspecified: Secondary | ICD-10-CM

## 2013-09-18 DIAGNOSIS — F1520 Other stimulant dependence, uncomplicated: Secondary | ICD-10-CM

## 2013-09-18 DIAGNOSIS — R140 Abdominal distension (gaseous): Secondary | ICD-10-CM

## 2013-09-18 LAB — POCT URINALYSIS DIP (DEVICE)
Bilirubin Urine: NEGATIVE
Glucose, UA: NEGATIVE mg/dL
Ketones, ur: NEGATIVE mg/dL
Nitrite: NEGATIVE
Protein, ur: NEGATIVE mg/dL
Specific Gravity, Urine: 1.015 (ref 1.005–1.030)
Urobilinogen, UA: 0.2 mg/dL (ref 0.0–1.0)
pH: 6 (ref 5.0–8.0)

## 2013-09-18 MED ORDER — CLONAZEPAM 0.5 MG PO TABS: 0.5000 mg | ORAL_TABLET | Freq: Two times a day (BID) | ORAL | Status: DC | PRN

## 2013-09-18 NOTE — ED Provider Notes (Signed)
CSN: [id number]     Arrival date & time 09/18/13  1622 History   None    Chief Complaint  Patient presents with  . Abdominal Pain   (Consider location/radiation/quality/duration/timing/severity/associated sxs/prior Treatment) HPI  67 year old F presenting with abdominal fullness and urinary frequency. These symptoms have been present for several weeks. They're accompanied by pain in her back, bad breath, and nausea. She denies any vomiting. She has 4-5 bowel movements a day denies frank blood in her stool and also denies melena. She denies any weight loss. She has a history of total abdominal hysterectomy in 1987 for cervical cancer in situ with no other intra-abdominal procedures. The patient also notes jitteriness and claminess that she attributes to being out of her clonazepam. She has been taking it since 1987 and tried to wean herself off. Her last refill was on 07/31/13. She took 0.25 mg this AM and has an appt to see her PCP, Dr. Shana Chute, on 09/23/13.   Past Medical History  Diagnosis Date  . Hypertension    History reviewed. No pertinent past surgical history. History reviewed. No pertinent family history. History  Substance Use Topics  . Smoking status: Never Smoker   . Smokeless tobacco: Not on file  . Alcohol Use: No   OB History   Grav Para Term Preterm Abortions TAB SAB Ect Mult Living                 Review of Systems See HPI Allergies  Review of patient's allergies indicates no known allergies.  Home Medications   Current Outpatient Rx  Name  Route  Sig  Dispense  Refill  . amLODipine (NORVASC) 10 MG tablet   Oral   Take 10 mg by mouth daily.         . cephALEXin (KEFLEX) 500 MG capsule   Oral   Take 1 capsule (500 mg total) by mouth 4 (four) times daily. Take all of medicine and drink lots of fluids   20 capsule   0   . ciprofloxacin (CIPRO) 500 MG tablet               . clonazePAM (KLONOPIN) 0.5 MG tablet   Oral   Take 1 tablet (0.5 mg total)  by mouth 2 (two) times daily as needed for anxiety.   5 tablet   0   . HYDROcodone-acetaminophen (NORCO/VICODIN) 5-325 MG per tablet               . traMADol (ULTRAM) 50 MG tablet   Oral   Take 1 tablet (50 mg total) by mouth every 6 (six) hours as needed for pain.   15 tablet   0    BP 136/86  Pulse 97  Temp(Src) 98.8 F (37.1 C) (Oral)  Resp 18  SpO2 96%  LMP 06/30/2013 Physical Exam  Constitutional: She appears well-developed and well-nourished. No distress.  Neck: Normal range of motion. Neck supple.  Pulmonary/Chest: Effort normal and breath sounds normal.  Abdominal: Soft. Bowel sounds are normal. She exhibits no distension and no mass. There is no tenderness. There is no guarding.  obese  Skin: She is not diaphoretic.  Psychiatric: She is hyperactive.    ED Course  Procedures (including critical care time) Labs Review Labs Reviewed  POCT URINALYSIS DIP (DEVICE) - Abnormal; Notable for the following:    Hgb urine dipstick SMALL (*)    Leukocytes, UA SMALL (*)    All other components within normal limits  URINE  CULTURE   Imaging Review Dg Abd 1 View  09/18/2013   CLINICAL DATA:  67 year old female with bloating and abdominal pain. Low back pain. Frequent urination. Initial encounter.  EXAM: ABDOMEN - 1 VIEW  COMPARISON:  CT Abdomen and Pelvis 12/15/2009.  FINDINGS: Supine view at 1855 hrs. Non obstructed bowel gas pattern. Negative visible abdominal and pelvic visceral contours. Grossly stable pelvic phleboliths. No definite urologic calculus identified. No acute osseous abnormality identified.  IMPRESSION: Negative KUB appearance of the abdomen.   Electronically Signed   By: Augusto Gamble M.D.   On: 09/18/2013 19:12      MDM   1. Abdominal bloating   2. Benzodiazepine withdrawal    1. Abdominal Bloating - Large stool burden, needs clean out; Also will send urine for culture; no evidence of acute abdomen 2. Benzo withdawal - On benzo for > 30 years, needs  long taper, will given 5 tablets today    Garnetta Buddy, MD 09/18/13 [phone]

## 2013-09-18 NOTE — ED Notes (Signed)
C/o abd pain States she is having swelling in her stomach for a week now States her breathe has an odor due to stomach problems She does have pain when urinating

## 2013-09-19 LAB — URINE CULTURE
Colony Count: NO GROWTH
Culture: NO GROWTH
Special Requests: NORMAL

## 2013-09-21 NOTE — ED Provider Notes (Signed)
Medical screening examination/treatment/procedure(s) were performed by resident physician or non-physician practitioner and as supervising physician I was immediately available for consultation/collaboration.   Velora Horstman DOUGLAS MD.   Tylar Merendino D Bisma Klett, MD 09/21/13 2034 

## 2013-10-10 ENCOUNTER — Encounter: Payer: Self-pay | Admitting: Obstetrics

## 2013-10-11 LAB — HM MAMMOGRAPHY: HM Mammogram: NORMAL

## 2013-10-14 ENCOUNTER — Emergency Department (HOSPITAL_COMMUNITY)
Admission: EM | Admit: 2013-10-14 | Discharge: 2013-10-14 | Disposition: A | Discharge status: Home / Self Care | Payer: Medicare HMO | Attending: Emergency Medicine | Admitting: Emergency Medicine

## 2013-10-14 ENCOUNTER — Encounter (HOSPITAL_COMMUNITY): Payer: Self-pay | Admitting: Emergency Medicine

## 2013-10-14 DIAGNOSIS — G4700 Insomnia, unspecified: Secondary | ICD-10-CM | POA: Insufficient documentation

## 2013-10-14 DIAGNOSIS — N390 Urinary tract infection, site not specified: Secondary | ICD-10-CM | POA: Insufficient documentation

## 2013-10-14 DIAGNOSIS — F411 Generalized anxiety disorder: Secondary | ICD-10-CM | POA: Insufficient documentation

## 2013-10-14 DIAGNOSIS — R630 Anorexia: Secondary | ICD-10-CM | POA: Insufficient documentation

## 2013-10-14 DIAGNOSIS — I10 Essential (primary) hypertension: Secondary | ICD-10-CM | POA: Insufficient documentation

## 2013-10-14 DIAGNOSIS — Z79899 Other long term (current) drug therapy: Secondary | ICD-10-CM | POA: Insufficient documentation

## 2013-10-14 DIAGNOSIS — R110 Nausea: Secondary | ICD-10-CM | POA: Insufficient documentation

## 2013-10-14 DIAGNOSIS — R209 Unspecified disturbances of skin sensation: Secondary | ICD-10-CM | POA: Insufficient documentation

## 2013-10-14 DIAGNOSIS — F419 Anxiety disorder, unspecified: Secondary | ICD-10-CM

## 2013-10-14 HISTORY — DX: Anxiety disorder, unspecified: F41.9

## 2013-10-14 LAB — POCT I-STAT, CHEM 8
BUN: 17 mg/dL (ref 6–23)
Calcium, Ion: 1.22 mmol/L (ref 1.13–1.30)
Chloride: 105 mEq/L (ref 96–112)
Creatinine, Ser: 1.1 mg/dL (ref 0.50–1.10)
Glucose, Bld: 104 mg/dL — ABNORMAL HIGH (ref 70–99)
HCT: 38 % (ref 36.0–46.0)
Hemoglobin: 12.9 g/dL (ref 12.0–15.0)
Potassium: 3.6 mEq/L — ABNORMAL LOW (ref 3.7–5.3)
Sodium: 142 mEq/L (ref 137–147)
TCO2: 24 mmol/L (ref 0–100)

## 2013-10-14 LAB — URINE MICROSCOPIC-ADD ON

## 2013-10-14 LAB — URINALYSIS, ROUTINE W REFLEX MICROSCOPIC
Bilirubin Urine: NEGATIVE
Glucose, UA: NEGATIVE mg/dL
Ketones, ur: NEGATIVE mg/dL
Nitrite: NEGATIVE
Protein, ur: NEGATIVE mg/dL
Specific Gravity, Urine: 1.014 (ref 1.005–1.030)
Urobilinogen, UA: 0.2 mg/dL (ref 0.0–1.0)
pH: 5.5 (ref 5.0–8.0)

## 2013-10-14 LAB — GLUCOSE, CAPILLARY: Glucose-Capillary: 112 mg/dL — ABNORMAL HIGH (ref 70–99)

## 2013-10-14 MED ORDER — SULFAMETHOXAZOLE-TRIMETHOPRIM 800-160 MG PO TABS: 1.0000 | ORAL_TABLET | Freq: Two times a day (BID) | ORAL | Status: AC

## 2013-10-14 MED ORDER — CLONAZEPAM 0.5 MG PO TABS: 0.5000 mg | ORAL_TABLET | Freq: Two times a day (BID) | ORAL | Status: DC | PRN

## 2013-10-14 NOTE — ED Notes (Signed)
Pt has not been taking anxiety meds as prescribed because she ran out of them.

## 2013-10-14 NOTE — ED Notes (Signed)
Pt alert and oriented, with steady gait at time of discharge. Pt given discharge papers and papers explained. All questions answered and pt walked to discharge.  

## 2013-10-14 NOTE — Discharge Instructions (Signed)
Anxiety and Panic Attacks Your caregiver has informed you that you are having an anxiety or panic attack. There may be many forms of this. Most of the time these attacks come suddenly and without warning. They come at any time of day, including periods of sleep, and at any time of life. They may be strong and unexplained. Although panic attacks are very scary, they are physically harmless. Sometimes the cause of your anxiety is not known. Anxiety is a protective mechanism of the body in its fight or flight mechanism. Most of these perceived danger situations are actually nonphysical situations (such as anxiety over losing a job). CAUSES  The causes of an anxiety or panic attack are many. Panic attacks may occur in otherwise healthy people given a certain set of circumstances. There may be a genetic cause for panic attacks. Some medications may also have anxiety as a side effect. SYMPTOMS  Some of the most common feelings are:  Intense terror.  Dizziness, feeling faint.  Hot and cold flashes.  Fear of going crazy.  Feelings that nothing is real.  Sweating.  Shaking.  Chest pain or a fast heartbeat (palpitations).  Smothering, choking sensations.  Feelings of impending doom and that death is near.  Tingling of extremities, this may be from over-breathing.  Altered reality (derealization).  Being detached from yourself (depersonalization). Several symptoms can be present to make up anxiety or panic attacks. DIAGNOSIS  The evaluation by your caregiver will depend on the type of symptoms you are experiencing. The diagnosis of anxiety or panic attack is made when no physical illness can be determined to be a cause of the symptoms. TREATMENT  Treatment to prevent anxiety and panic attacks may include:  Avoidance of circumstances that cause anxiety.  Reassurance and relaxation.  Regular exercise.  Relaxation therapies, such as yoga.  Psychotherapy with a psychiatrist or  therapist.  Avoidance of caffeine, alcohol and illegal drugs.  Prescribed medication. SEEK IMMEDIATE MEDICAL CARE IF:   You experience panic attack symptoms that are different than your usual symptoms.  You have any worsening or concerning symptoms. Document Released: 09/27/2005 Document Revised: 12/20/2011 Document Reviewed: 05/11/2013 [facility] Patient Information 2014 [place].  Urinary Tract Infection Urinary tract infections (UTIs) can develop anywhere along your urinary tract. Your urinary tract is your body's drainage system for removing wastes and extra water. Your urinary tract includes two kidneys, two ureters, a bladder, and a urethra. Your kidneys are a pair of bean-shaped organs. Each kidney is about the size of your fist. They are located below your ribs, one on each side of your spine. CAUSES Infections are caused by microbes, which are microscopic organisms, including fungi, viruses, and bacteria. These organisms are so small that they can only be seen through a microscope. Bacteria are the microbes that most commonly cause UTIs. SYMPTOMS  Symptoms of UTIs may vary by age and gender of the patient and by the location of the infection. Symptoms in young women typically include a frequent and intense urge to urinate and a painful, burning feeling in the bladder or urethra during urination. Older women and men are more likely to be tired, shaky, and weak and have muscle aches and abdominal pain. A fever may mean the infection is in your kidneys. Other symptoms of a kidney infection include pain in your back or sides below the ribs, nausea, and vomiting. DIAGNOSIS To diagnose a UTI, your caregiver will ask you about your symptoms. Your caregiver also will ask to provide  a urine sample. The urine sample will be tested for bacteria and white blood cells. White blood cells are made by your body to help fight infection. TREATMENT  Typically, UTIs can be treated with medication.  Because most UTIs are caused by a bacterial infection, they usually can be treated with the use of antibiotics. The choice of antibiotic and length of treatment depend on your symptoms and the type of bacteria causing your infection. HOME CARE INSTRUCTIONS  If you were prescribed antibiotics, take them exactly as your caregiver instructs you. Finish the medication even if you feel better after you have only taken some of the medication.  Drink enough water and fluids to keep your urine clear or pale yellow.  Avoid caffeine, tea, and carbonated beverages. They tend to irritate your bladder.  Empty your bladder often. Avoid holding urine for long periods of time.  Empty your bladder before and after sexual intercourse.  After a bowel movement, women should cleanse from front to back. Use each tissue only once. SEEK MEDICAL CARE IF:   You have back pain.  You develop a fever.  Your symptoms do not begin to resolve within 3 days. SEEK IMMEDIATE MEDICAL CARE IF:   You have severe back pain or lower abdominal pain.  You develop chills.  You have nausea or vomiting.  You have continued burning or discomfort with urination. MAKE SURE YOU:   Understand these instructions.  Will watch your condition.  Will get help right away if you are not doing well or get worse. Document Released: 07/07/2005 Document Revised: 03/28/2012 Document Reviewed: 11/05/2011 [facility] Patient Information 2014 [place].

## 2013-10-14 NOTE — ED Provider Notes (Signed)
TIME SEEN: 6:23 PM  CHIEF COMPLAINT: Tingling in her face and fingertips, nausea, insomnia, anorexia, anxiety  HPI: Patient is a 68 year old female with a history of hypertension and anxiety who presents the emergency department with worsening anxiety over the past several days. She states she has been on Klonopin 0.5 mg twice daily as needed for anxiety for several months. She states that she has recently run out of this medication and try to wean herself completely gone because she is worried it is addictive. She states her the past several days she has had tingling in her face bilaterally and in her bilateral fingertips. She is also had nausea, anorexia, insomnia. She reports at work today she felt so anxious that she did not feel she could continue to work. She denies that she is having headache or neck pain, focal weakness, chest pain or shortness of breath, vomiting or diarrhea, dysuria or hematuria. She denies any suicidal or homicidal ideation. She has followup with her doctor on January 13 but states "I do not feel I can make it told them to get this medication refilled."  ROS: See HPI Constitutional: no fever  Eyes: no drainage  ENT: no runny nose   Cardiovascular:  no chest pain  Resp: no SOB  GI: no vomiting GU: no dysuria Integumentary: no rash  Allergy: no hives  Musculoskeletal: no leg swelling  Neurological: no slurred speech ROS otherwise negative  PAST MEDICAL HISTORY/PAST SURGICAL HISTORY:  Past Medical History  Diagnosis Date  . Hypertension   . Anxiety     MEDICATIONS:  Prior to Admission medications   Medication Sig Start Date End Date Taking? Authorizing Provider  amLODipine (NORVASC) 10 MG tablet Take 10 mg by mouth daily.    Historical Provider, MD  cephALEXin (KEFLEX) 500 MG capsule Take 1 capsule (500 mg total) by mouth 4 (four) times daily. Take all of medicine and drink lots of fluids 06/30/13   Billy Fischer, MD  ciprofloxacin (CIPRO) 500 MG tablet   05/01/13   Historical Provider, MD  clonazePAM (KLONOPIN) 0.5 MG tablet Take 1 tablet (0.5 mg total) by mouth 2 (two) times daily as needed for anxiety. 09/18/13   Angelica Ran, MD  HYDROcodone-acetaminophen (NORCO/VICODIN) 5-325 MG per tablet  04/27/13   Historical Provider, MD  traMADol (ULTRAM) 50 MG tablet Take 1 tablet (50 mg total) by mouth every 6 (six) hours as needed for pain. 07/07/13   Elmyra Ricks Pisciotta, PA-C    ALLERGIES:  No Known Allergies  SOCIAL HISTORY:  History  Substance Use Topics  . Smoking status: Never Smoker   . Smokeless tobacco: Not on file  . Alcohol Use: No    FAMILY HISTORY: No family history on file.  EXAM: BP 158/79  Pulse 77  Temp(Src) 98.9 F (37.2 C) (Oral)  Resp 22  Ht 5\' 5"  (1.651 m)  Wt 160 lb (72.576 kg)  BMI 26.63 kg/m2  SpO2 98%  LMP 06/30/2013 CONSTITUTIONAL: Alert and oriented and responds appropriately to questions. Well-appearing; well-nourished, appears anxious HEAD: Normocephalic EYES: Conjunctivae clear, PERRL ENT: normal nose; no rhinorrhea; moist mucous membranes; pharynx without lesions noted NECK: Supple, no meningismus, no LAD  CARD: RRR; S1 and S2 appreciated; no murmurs, no clicks, no rubs, no gallops RESP: Normal chest excursion without splinting or tachypnea; breath sounds clear and equal bilaterally; no wheezes, no rhonchi, no rales,  ABD/GI: Normal bowel sounds; non-distended; soft, non-tender, no rebound, no guarding BACK:  The back appears normal and is non-tender  to palpation, there is no CVA tenderness EXT: Normal ROM in all joints; non-tender to palpation; no edema; normal capillary refill; no cyanosis    SKIN: Normal color for age and race; warm NEURO: Moves all extremities equally, sensation to light touch intact diffusely, cranial nerves II through XII intact PSYCH: The patient's mood and manner are appropriate. Grooming and personal hygiene are appropriate.  MEDICAL DECISION MAKING: Patient here with  anxiety and mild withdrawal symptoms from stopping Klonopin. She is hemodynamically stable. Her glucose is normal cheek. Will check basic labs to rule out anemia, electrolyte abnormality. We'll also check urinalysis. No concern for any life-threatening illness at this time. Patient reports she drove herself to the emergency department and would like to be able to drive home. We'll discharge with prescription for Klonopin. She has followup with her doctor in 9 days. She has no current safety concerns.  ED PROGRESS: Patient's electrolytes are within normal limits. Her hemoglobin is also normal. She does have a mild urinary tract infection. Urine culture is pending. We'll discharge home on antibiotics and refill her prescription for Klonopin. Given return precautions. Patient verbalizes understanding and is comfortable with plan.     [place], DO 10/14/13 1941

## 2013-10-14 NOTE — ED Notes (Addendum)
Pt reports ran out of her klonopin yesterday. Pt reports that she took .25mg  last night. Pt has been trying to stretch her klonopin for about 1 week. Pt reports tingling in fingers that radiates up her arm onset last night. Pt denies SI/HI at present. Pt reports nausea and decrease appetite.

## 2013-10-14 NOTE — ED Notes (Signed)
CBG is 112. Notified Nurse Burna Mortimer.

## 2013-10-15 LAB — URINE CULTURE: Colony Count: 25000

## 2013-10-23 ENCOUNTER — Other Ambulatory Visit: Payer: Self-pay | Admitting: Obstetrics

## 2013-10-23 ENCOUNTER — Ambulatory Visit (INDEPENDENT_AMBULATORY_CARE_PROVIDER_SITE_OTHER): Payer: Medicare HMO | Admitting: Obstetrics

## 2013-10-23 ENCOUNTER — Encounter: Payer: Self-pay | Admitting: Obstetrics

## 2013-10-23 VITALS — BP 126/73 | HR 73 | Temp 98.3°F | Ht 65.0 in | Wt 167.0 lb

## 2013-10-23 DIAGNOSIS — Z0000 Encounter for general adult medical examination without abnormal findings: Secondary | ICD-10-CM

## 2013-10-23 DIAGNOSIS — F419 Anxiety disorder, unspecified: Secondary | ICD-10-CM

## 2013-10-23 DIAGNOSIS — Z124 Encounter for screening for malignant neoplasm of cervix: Secondary | ICD-10-CM | POA: Insufficient documentation

## 2013-10-23 DIAGNOSIS — D069 Carcinoma in situ of cervix, unspecified: Secondary | ICD-10-CM | POA: Insufficient documentation

## 2013-10-23 DIAGNOSIS — Z01419 Encounter for gynecological examination (general) (routine) without abnormal findings: Secondary | ICD-10-CM

## 2013-10-23 DIAGNOSIS — R350 Frequency of micturition: Secondary | ICD-10-CM

## 2013-10-23 DIAGNOSIS — F411 Generalized anxiety disorder: Secondary | ICD-10-CM

## 2013-10-23 LAB — POCT URINALYSIS DIPSTICK
Bilirubin, UA: NEGATIVE
Glucose, UA: NEGATIVE
Ketones, UA: NEGATIVE
Nitrite, UA: NEGATIVE
Protein, UA: NEGATIVE
Spec Grav, UA: 1.015
Urobilinogen, UA: NEGATIVE
pH, UA: 5

## 2013-10-23 NOTE — Progress Notes (Signed)
Subjective:     Margaret Torres is a 68 y.o. female here for a problem exam.  Current complaints: urinary frequency, burning and pain with urination, denies back pain or fever.  Personal health questionnaire reviewed: yes.   Gynecologic History Patient's last menstrual period was 06/30/2013. Contraception: post menopausal status Last Pap: total hysterectomy / BSO Last mammogram: 07/13/2013  Results were normal  Obstetric History OB History  No data available     The following portions of the patient's history were reviewed and updated as appropriate: allergies, current medications, past family history, past medical history, past social history, past surgical history and problem list.  Review of Systems Pertinent items are noted in HPI.    Objective:    General appearance: alert and no distress Breasts:  Negative Abdomen: normal findings: soft, non-tender   Pelvic:  NEFG.  Vagina atrophic.  Cervix absent.              Uterus and adnexa not felt on bimanual exam.  No tenderness.  Assessment:    Healthy female exam.   H/O CIS of Cervix.  S/P TAH / BSO.  Anxiety   Plan:    Education reviewed: self breast exams. Follow up in: 1 year. Referred to Urology Referred to IM Klonopin Rx

## 2013-10-24 LAB — PAP IG (IMAGE GUIDED)

## 2013-10-24 LAB — URINE CULTURE
Colony Count: NO GROWTH
Organism ID, Bacteria: NO GROWTH

## 2013-10-30 ENCOUNTER — Encounter: Payer: Self-pay | Admitting: Obstetrics

## 2013-11-13 ENCOUNTER — Encounter: Payer: Self-pay | Admitting: Obstetrics

## 2014-03-27 ENCOUNTER — Encounter: Payer: Self-pay | Admitting: Family Medicine

## 2014-03-27 ENCOUNTER — Other Ambulatory Visit: Payer: Self-pay | Admitting: General Practice

## 2014-03-27 ENCOUNTER — Encounter: Payer: Self-pay | Admitting: General Practice

## 2014-03-27 ENCOUNTER — Ambulatory Visit (INDEPENDENT_AMBULATORY_CARE_PROVIDER_SITE_OTHER): Payer: Medicare HMO | Admitting: Family Medicine

## 2014-03-27 VITALS — BP 120/80 | HR 76 | Temp 98.2°F | Resp 16 | Ht 63.5 in | Wt 164.4 lb

## 2014-03-27 DIAGNOSIS — R42 Dizziness and giddiness: Secondary | ICD-10-CM | POA: Insufficient documentation

## 2014-03-27 DIAGNOSIS — R350 Frequency of micturition: Secondary | ICD-10-CM

## 2014-03-27 DIAGNOSIS — K644 Residual hemorrhoidal skin tags: Secondary | ICD-10-CM

## 2014-03-27 DIAGNOSIS — N644 Mastodynia: Secondary | ICD-10-CM

## 2014-03-27 LAB — CBC WITH DIFFERENTIAL/PLATELET
Basophils Absolute: 0 10*3/uL (ref 0.0–0.1)
Basophils Relative: 0.4 % (ref 0.0–3.0)
Eosinophils Absolute: 0.1 10*3/uL (ref 0.0–0.7)
Eosinophils Relative: 1 % (ref 0.0–5.0)
HCT: 36.7 % (ref 36.0–46.0)
Hemoglobin: 11.9 g/dL — ABNORMAL LOW (ref 12.0–15.0)
Lymphocytes Relative: 25.3 % (ref 12.0–46.0)
Lymphs Abs: 1.7 10*3/uL (ref 0.7–4.0)
MCHC: 32.4 g/dL (ref 30.0–36.0)
MCV: 91.8 fl (ref 78.0–100.0)
Monocytes Absolute: 0.4 10*3/uL (ref 0.1–1.0)
Monocytes Relative: 6.2 % (ref 3.0–12.0)
Neutro Abs: 4.4 10*3/uL (ref 1.4–7.7)
Neutrophils Relative %: 67.1 % (ref 43.0–77.0)
Platelets: 310 10*3/uL (ref 150.0–400.0)
RBC: 4 Mil/uL (ref 3.87–5.11)
RDW: 13.6 % (ref 11.5–15.5)
WBC: 6.6 10*3/uL (ref 4.0–10.5)

## 2014-03-27 LAB — POCT URINALYSIS DIPSTICK
Bilirubin, UA: NEGATIVE
Blood, UA: NEGATIVE
Glucose, UA: NEGATIVE
Ketones, UA: NEGATIVE
Leukocytes, UA: NEGATIVE
Nitrite, UA: NEGATIVE
Protein, UA: NEGATIVE
Spec Grav, UA: <=1.005
Urobilinogen, UA: 0.2
pH, UA: 7

## 2014-03-27 LAB — BASIC METABOLIC PANEL
BUN: 13 mg/dL (ref 6–23)
CO2: 28 mEq/L (ref 19–32)
Calcium: 9.2 mg/dL (ref 8.4–10.5)
Chloride: 105 mEq/L (ref 96–112)
Creatinine, Ser: 0.9 mg/dL (ref 0.4–1.2)
GFR: 79.75 mL/min (ref >60.00)
Glucose, Bld: 96 mg/dL (ref 70–99)
Potassium: 3.7 mEq/L (ref 3.5–5.1)
Sodium: 140 mEq/L (ref 135–145)

## 2014-03-27 LAB — HEPATIC FUNCTION PANEL
ALT: 17 U/L (ref 0–35)
AST: 16 U/L (ref 0–37)
Albumin: 4.4 g/dL (ref 3.5–5.2)
Alkaline Phosphatase: 66 U/L (ref 39–117)
Bilirubin, Direct: 0 mg/dL (ref 0.0–0.3)
Total Bilirubin: 0.4 mg/dL (ref 0.2–1.2)
Total Protein: 7.3 g/dL (ref 6.0–8.3)

## 2014-03-27 LAB — TSH: TSH: 0.34 u[IU]/mL — ABNORMAL LOW (ref 0.35–4.50)

## 2014-03-27 MED ORDER — HYDROCORTISONE ACE-PRAMOXINE 2.5-1 % RE CREA: 1.0000 "application " | TOPICAL_CREAM | Freq: Three times a day (TID) | RECTAL | Status: DC

## 2014-03-27 MED ORDER — DIBUCAINE 1 % EX OINT: TOPICAL_OINTMENT | Freq: Three times a day (TID) | CUTANEOUS | Status: DC | PRN

## 2014-03-27 NOTE — Assessment & Plan Note (Signed)
New.  sxs are fleeting and pt is asymptomatic today.  Check labs to r/o metabolic cause.  Encouraged increased fluid intake, changing positions slowly.  Pt to keep a symptom log.  Will follow.

## 2014-03-27 NOTE — Progress Notes (Signed)
Pre visit review using our clinic review tool, if applicable. No additional management support is needed unless otherwise documented below in the visit note. 

## 2014-03-27 NOTE — Patient Instructions (Addendum)
Schedule your complete physical in 6 months We'll notify you of your lab results and make any changes if needed Use the Nupercainal as needed for pain, and the Analpram to shrink the hemorrhoids (if no improvement in this, will need to see GI) Drink plenty of fluids, change positions slowly to improve dizziness Try and wear a good, supportive bra to prevent breast pain and let me know if this doesn't improve Call with any questions or concerns Welcome!  We're glad to have you!!!

## 2014-03-27 NOTE — Assessment & Plan Note (Signed)
New.  Unclear if pt's sxs are a current UTI or OAB.  Check UA and cx prn.  Will determine next steps based on results.

## 2014-03-27 NOTE — Assessment & Plan Note (Signed)
New.  Pt declined exam today.  Offered GI referral due to chronicity of sxs- pt declined due to cost.  Start Analpram and Nupercainal.  If no improvement, will refer to GI.  Pt in agreement.

## 2014-03-27 NOTE — Progress Notes (Signed)
   Subjective:    [name], female    DOB: 07/27/1946, 68 y.o.   MRN: [id number]  HPI New to establish.  Previous MD- Tobias  Hemorrhoids- chronic problem for pt, for last 2 weeks has been having increased burning, itching, pain.  + bleeding- BRB.  Some fecal leakage.  Has seen Dr Benson Norway previously.  Urinary frequency- reports she is having difficulty controlling urine.  Up 'all night', 'every 45 minutes'.  + dysuria for 'the past couple weeks'.  + suprapubic pressure.  Intermittent dizziness- described as an unsteady feeling.  Has hx of elevated sugar (was told on 5/8 that sugar was 125).  sxs are instantaneous.  Able to walk w/o difficulty, no falls.  L breast pain- upper and lateral breast.  Occuring intermittently.  No mass.  Normal mammo in Oct.  Admits to wearing very stretched out bra and 'now that you mention it, that's when it hurts'.   Review of Systems For ROS see HPI     Objective:   Physical Exam  Vitals reviewed. Constitutional: She is oriented to person, place, and time. She appears well-developed and well-nourished. No distress.  HENT:  Head: Normocephalic and atraumatic.  Eyes: Conjunctivae and EOM are normal. Pupils are equal, round, and reactive to light.  Neck: Normal range of motion. Neck supple. No thyromegaly present.  Cardiovascular: Normal rate, regular rhythm, normal heart sounds and intact distal pulses.   No murmur heard. Pulmonary/Chest: Effort normal and breath sounds normal. No respiratory distress. She exhibits tenderness (mild TTP over upper, lateral L breast.  no mass on PE).  Abdominal: Soft. She exhibits no distension. There is no tenderness. There is no rebound.  Genitourinary:  Pt declined rectal exam  Musculoskeletal: She exhibits no edema.  Lymphadenopathy:    She has no cervical adenopathy.  Neurological: She is alert and oriented to person, place, and time. She has normal reflexes. No cranial nerve  deficit. Coordination normal.  Skin: Skin is warm and dry.  Psychiatric: She has a normal mood and affect. Her behavior is normal.          Assessment & Plan:

## 2014-03-27 NOTE — Assessment & Plan Note (Signed)
New.  Pt reports that pain is intermittent and she thinks it occurs when she wears one particular bra that is very stretched out and not supportive.  No mass on exam today.  Normal mammo <1 yr ago.  Encouraged better support and if sxs continue, she is to let me know so we can repeat her imaging.  Pt expressed understanding and is in agreement w/ plan.

## 2014-03-29 ENCOUNTER — Telehealth: Payer: Self-pay | Admitting: Family Medicine

## 2014-03-29 MED ORDER — HYDROCORTISONE 2.5 % RE CREA: TOPICAL_CREAM | Freq: Two times a day (BID) | RECTAL | Status: DC

## 2014-03-29 NOTE — Telephone Encounter (Signed)
Please advise      KP 

## 2014-03-29 NOTE — Telephone Encounter (Signed)
hydroccortisone rectal 2.5% apply bid for max 7 days

## 2014-03-29 NOTE — Telephone Encounter (Signed)
Rx faxed.    KP 

## 2014-03-29 NOTE — Telephone Encounter (Signed)
Caller name: Dominga  Call back [phone]:  Pharmacy: [facility]  Reason for call:  Pt got some medication for hemorrhoids but it cost too much.  Pt wants to know if there is anything cheaper she can get.

## 2014-04-22 ENCOUNTER — Telehealth: Payer: Self-pay | Admitting: General Practice

## 2014-04-22 ENCOUNTER — Telehealth: Payer: Self-pay | Admitting: Family Medicine

## 2014-04-22 NOTE — Telephone Encounter (Signed)
What prescription?  I don't see it listed.Marland KitchenMarland Kitchen

## 2014-04-22 NOTE — Telephone Encounter (Signed)
No documentation of this medication, ok to fill as daily rx for pt?

## 2014-04-22 NOTE — Telephone Encounter (Signed)
[facility] for refill- please remind to schedule for CPE in Dec

## 2014-04-22 NOTE — Telephone Encounter (Signed)
See other phone note

## 2014-04-22 NOTE — Telephone Encounter (Signed)
Caller name: Mariesha Relation to OE:[id number] Call back [place], [place] [address]   Reason for call: to request a refill for amLODipine (NORVASC) 10 MG tablet

## 2014-04-23 MED ORDER — AMLODIPINE BESYLATE 10 MG PO TABS: 10.0000 mg | ORAL_TABLET | Freq: Every day | ORAL | Status: DC

## 2014-04-23 NOTE — Telephone Encounter (Signed)
Med filled and letter mailed to pt to schedule physical.

## 2014-06-12 ENCOUNTER — Telehealth: Payer: Self-pay | Admitting: Family Medicine

## 2014-06-12 MED ORDER — CLONAZEPAM 0.5 MG PO TABS: 0.5000 mg | ORAL_TABLET | Freq: Two times a day (BID) | ORAL | Status: DC | PRN

## 2014-06-12 NOTE — Telephone Encounter (Signed)
Last O V6-17-15 Med filled 10-14-13 #20 with 0

## 2014-06-12 NOTE — Telephone Encounter (Signed)
Med filled.  

## 2014-06-12 NOTE — Telephone Encounter (Signed)
Caller name: Kasheena  Relation to pt: self  Call back number: [phone] [facility]acy: [facility] [phone]   Reason for call:   pt requesting refill clonazePAM (KLONOPIN) 0.5 MG tablet. Pharmacy states you must call in

## 2014-06-12 NOTE — Telephone Encounter (Signed)
[facility] for #20, no refills

## 2014-06-20 ENCOUNTER — Encounter: Payer: Self-pay | Admitting: Family Medicine

## 2014-06-20 ENCOUNTER — Ambulatory Visit (INDEPENDENT_AMBULATORY_CARE_PROVIDER_SITE_OTHER): Payer: Medicare HMO | Admitting: Family Medicine

## 2014-06-20 VITALS — BP 126/78 | HR 72 | Temp 98.2°F | Resp 16 | Wt 159.5 lb

## 2014-06-20 DIAGNOSIS — F419 Anxiety disorder, unspecified: Secondary | ICD-10-CM

## 2014-06-20 DIAGNOSIS — K625 Hemorrhage of anus and rectum: Secondary | ICD-10-CM

## 2014-06-20 DIAGNOSIS — R195 Other fecal abnormalities: Secondary | ICD-10-CM | POA: Insufficient documentation

## 2014-06-20 DIAGNOSIS — F411 Generalized anxiety disorder: Secondary | ICD-10-CM

## 2014-06-20 DIAGNOSIS — G4700 Insomnia, unspecified: Secondary | ICD-10-CM

## 2014-06-20 DIAGNOSIS — J309 Allergic rhinitis, unspecified: Secondary | ICD-10-CM

## 2014-06-20 HISTORY — DX: Insomnia, unspecified: G47.00

## 2014-06-20 MED ORDER — TRAZODONE HCL 50 MG PO TABS: 25.0000 mg | ORAL_TABLET | Freq: Every evening | ORAL | Status: DC | PRN

## 2014-06-20 MED ORDER — CETIRIZINE HCL 10 MG PO TABS: 10.0000 mg | ORAL_TABLET | Freq: Every day | ORAL | Status: DC

## 2014-06-20 MED ORDER — CLONAZEPAM 0.5 MG PO TABS: 0.5000 mg | ORAL_TABLET | Freq: Two times a day (BID) | ORAL | Status: DC | PRN

## 2014-06-20 NOTE — Assessment & Plan Note (Signed)
Recurrent problem for pt.  Hx of external hemorrhoids.  Pt has been asymptomatic x1 week.  No abnormalities noted on DRE or visual inspection.  Due to bleeding and report of fecal soiling, will refer to GI.  Pt expressed understanding and is in agreement w/ plan.

## 2014-06-20 NOTE — Progress Notes (Signed)
   Subjective:    [name], female    DOB: 11-Feb-1946, 68 y.o.   MRN: [id number]  HPI Hemorrhoids- pt has hx of external hemorrhoids.  Pt reports she wears a pad b/c she will have blood or feces in her panties.  Pt had colonoscopy in 2013 w/ Dr Benson Norway.  Pt denies current sxs but last week had rectal pain and bleeding.  Pt reports 'strange bowels- just ooky'.  Insomnia- pt reports difficulty staying asleep.  Pt states melatonin caused increased BP.  Has tried OTC meds w/o relief.  Allergic rhinitis- chronic problem.  No relief w/ nasal steroid.  + nasal congestion, PND.  Anxiety- pt reports that she usually gets 30 Clonazepam monthly and is upset w/ the script for 20.   Review of Systems For ROS see HPI     Objective:   Physical Exam  Vitals reviewed. Constitutional: She appears well-developed and well-nourished. No distress.  HENT:  Head: Normocephalic and atraumatic.  Right Ear: Tympanic membrane normal.  Left Ear: Tympanic membrane normal.  Nose: Mucosal edema and rhinorrhea present. Right sinus exhibits no maxillary sinus tenderness and no frontal sinus tenderness. Left sinus exhibits no maxillary sinus tenderness and no frontal sinus tenderness.  Mouth/Throat: Mucous membranes are normal. Posterior oropharyngeal erythema (w/ PND) present.  Eyes: Conjunctivae and EOM are normal. Pupils are equal, round, and reactive to light.  Neck: Normal range of motion. Neck supple.  Cardiovascular: Normal rate, regular rhythm and normal heart sounds.   Pulmonary/Chest: Effort normal and breath sounds normal. No respiratory distress. She has no wheezes. She has no rales.  Genitourinary: Guaiac negative stool.  No external hemorrhoids noted, skin tag at 6 o'clock Normal sphincter tone No palpable abnormality on DRE  Lymphadenopathy:    She has no cervical adenopathy.          Assessment & Plan:

## 2014-06-20 NOTE — Assessment & Plan Note (Signed)
New for provider, ongoing for pt.  Try low dose trazodone and monitor for improvement.

## 2014-06-20 NOTE — Patient Instructions (Signed)
Follow up as scheduled We'll notify you of your GI appt for the bleeding Start the Trazodone- 1/2 tab nightly as needed for sleep Start the Zyrtec daily for nasal/seasonal allergies Drink plenty of fluids Call with any questions or concerns Hang in there!

## 2014-06-20 NOTE — Progress Notes (Signed)
Pre visit review using our clinic review tool, if applicable. No additional management support is needed unless otherwise documented below in the visit note. 

## 2014-06-20 NOTE — Assessment & Plan Note (Signed)
Deteriorated.  Continue nasal steroid.  Add Zyrtec.  Reviewed supportive care and red flags that should prompt return.  Pt expressed understanding and is in agreement w/ plan.

## 2014-06-20 NOTE — Assessment & Plan Note (Signed)
Ongoing issue for pt.  Increased # of klonopin to 30 w/ 1 refill.  Will follow.

## 2014-07-09 ENCOUNTER — Other Ambulatory Visit: Payer: Self-pay | Admitting: Family Medicine

## 2014-07-09 DIAGNOSIS — Z1231 Encounter for screening mammogram for malignant neoplasm of breast: Secondary | ICD-10-CM

## 2014-07-16 ENCOUNTER — Ambulatory Visit (HOSPITAL_COMMUNITY)
Admission: RE | Admit: 2014-07-16 | Discharge: 2014-07-16 | Disposition: A | Discharge status: Home / Self Care | Payer: Medicare HMO | Source: Ambulatory Visit | Attending: Family Medicine | Admitting: Family Medicine

## 2014-07-16 DIAGNOSIS — Z1231 Encounter for screening mammogram for malignant neoplasm of breast: Secondary | ICD-10-CM | POA: Diagnosis not present

## 2014-07-18 ENCOUNTER — Other Ambulatory Visit: Payer: Self-pay | Admitting: Family Medicine

## 2014-07-18 DIAGNOSIS — R928 Other abnormal and inconclusive findings on diagnostic imaging of breast: Secondary | ICD-10-CM

## 2014-07-22 ENCOUNTER — Other Ambulatory Visit: Payer: Self-pay | Admitting: Family Medicine

## 2014-07-22 ENCOUNTER — Other Ambulatory Visit: Payer: Self-pay

## 2014-07-22 DIAGNOSIS — R928 Other abnormal and inconclusive findings on diagnostic imaging of breast: Secondary | ICD-10-CM

## 2014-07-26 ENCOUNTER — Ambulatory Visit
Admission: RE | Admit: 2014-07-26 | Discharge: 2014-07-26 | Disposition: A | Discharge status: Home / Self Care | Payer: Medicare HMO | Source: Ambulatory Visit | Attending: Family Medicine | Admitting: Family Medicine

## 2014-07-26 DIAGNOSIS — R928 Other abnormal and inconclusive findings on diagnostic imaging of breast: Secondary | ICD-10-CM

## 2014-09-30 ENCOUNTER — Ambulatory Visit (INDEPENDENT_AMBULATORY_CARE_PROVIDER_SITE_OTHER): Payer: Medicare HMO | Admitting: Family Medicine

## 2014-09-30 ENCOUNTER — Encounter: Payer: Self-pay | Admitting: Family Medicine

## 2014-09-30 VITALS — BP 130/80 | HR 83 | Temp 98.8°F | Resp 16 | Ht 64.5 in | Wt 156.4 lb

## 2014-09-30 DIAGNOSIS — M25562 Pain in left knee: Secondary | ICD-10-CM

## 2014-09-30 DIAGNOSIS — J209 Acute bronchitis, unspecified: Secondary | ICD-10-CM

## 2014-09-30 HISTORY — DX: Pain in left knee: M25.562

## 2014-09-30 MED ORDER — FLUTICASONE PROPIONATE 50 MCG/ACT NA SUSP: 2.0000 | Freq: Every day | NASAL | Status: DC

## 2014-09-30 MED ORDER — AMOXICILLIN 875 MG PO TABS: 875.0000 mg | ORAL_TABLET | Freq: Two times a day (BID) | ORAL | Status: DC

## 2014-09-30 MED ORDER — GUAIFENESIN-CODEINE 100-10 MG/5ML PO SYRP: 10.0000 mL | ORAL_SOLUTION | Freq: Three times a day (TID) | ORAL | Status: DC | PRN

## 2014-09-30 MED ORDER — BENZONATATE 200 MG PO CAPS: 200.0000 mg | ORAL_CAPSULE | Freq: Three times a day (TID) | ORAL | Status: DC | PRN

## 2014-09-30 MED ORDER — DICLOFENAC SODIUM 1 % TD GEL: 4.0000 g | Freq: Four times a day (QID) | TRANSDERMAL | Status: DC

## 2014-09-30 NOTE — Patient Instructions (Signed)
Schedule your complete physical at your convenience We'll call you with your Ortho appt Use the Voltaren gel on your knee up to 4x/day for knee pain ICE! Start the Amoxicillin twice daily- take w/ food Drink plenty of fluids Use the cough syrup as needed- may cause drowsiness Use the Tessalon cough pills as needed for daytime cough (can combine the syrup and the pills) Please call with any questions or concerns- particularly if not improving or worsening Hang in there!!! Happy Holidays!!!

## 2014-09-30 NOTE — Progress Notes (Signed)
   Subjective:    [name], female    DOB: 1946/03/03, 68 y.o.   MRN: [id number]  HPI URI- pt started feeling badly 12/17.  sxs started w/ body aches.  Took Theraflu w/ relief of body aches but continues to have HAs, productive cough.  Denies facial pain/pressure.  Taking Ibuprofen w/o relief.  + sore throat w/ laryngitis.  Having some chest discomfort bilaterally due to cough.  L knee pain- started 3 weeks ago, will radiate up into hip.  'i can feel crunching' w/ flexion/extension of knee.     Review of Systems For ROS see HPI     Objective:   Physical Exam  Constitutional: She appears well-developed and well-nourished. No distress.  HENT:  Head: Normocephalic and atraumatic.  TMs normal bilaterally Mild nasal congestion Throat w/out erythema, edema, or exudate  Eyes: Conjunctivae and EOM are normal. Pupils are equal, round, and reactive to light.  Neck: Normal range of motion. Neck supple.  Cardiovascular: Normal rate, regular rhythm, normal heart sounds and intact distal pulses.   No murmur heard. Pulmonary/Chest: Effort normal and breath sounds normal. No respiratory distress. She has no wheezes.  + hacking cough  Musculoskeletal: She exhibits tenderness (over L anterior knee w/ audible and palpable crepitus on flexion/extension). She exhibits no edema.  Lymphadenopathy:    She has no cervical adenopathy.  Vitals reviewed.         Assessment & Plan:

## 2014-09-30 NOTE — Progress Notes (Signed)
Pre visit review using our clinic review tool, if applicable. No additional management support is needed unless otherwise documented below in the visit note. 

## 2014-10-01 NOTE — Assessment & Plan Note (Signed)
New.  Start abx for URI.  Cough meds prn.  Reviewed supportive care and red flags that should prompt return.  Pt expressed understanding and is in agreement w/ plan.

## 2014-10-01 NOTE — Assessment & Plan Note (Signed)
New.  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  Start Voltaren gel.  Refer to ortho for complete evaluation and tx.  Reviewed supportive care and red flags that should prompt return.  Pt expressed understanding and is in agreement w/ plan.

## 2014-10-17 ENCOUNTER — Other Ambulatory Visit: Payer: Self-pay | Admitting: Family Medicine

## 2014-10-17 NOTE — Telephone Encounter (Signed)
Last OV 09/30/14 Clonazepam last filled 06/20/14 #30 with 1

## 2014-10-17 NOTE — Telephone Encounter (Signed)
Med filled and faxed.  

## 2014-10-19 ENCOUNTER — Other Ambulatory Visit: Payer: Self-pay | Admitting: Family Medicine

## 2014-10-21 NOTE — Telephone Encounter (Signed)
Pt was given 30 w/ 1 refill (60 pills in total) 5 days ago.  She should not need refill

## 2014-10-21 NOTE — Telephone Encounter (Signed)
Last OV 09-30-14 Clonazepam last filled 10-17-14 #30 with 1 (this was same as September Rx)  Sig says BID PRN, please advise.

## 2014-12-09 ENCOUNTER — Other Ambulatory Visit: Payer: Self-pay | Admitting: General Practice

## 2014-12-09 MED ORDER — CLONAZEPAM 0.5 MG PO TABS: 0.5000 mg | ORAL_TABLET | Freq: Two times a day (BID) | ORAL | Status: DC | PRN

## 2014-12-09 NOTE — Telephone Encounter (Signed)
Last OV 09/30/14 (bronchitis) Clonazepam last filled 10/17/14 #30 with 0

## 2015-01-02 ENCOUNTER — Other Ambulatory Visit: Payer: Self-pay | Admitting: Family Medicine

## 2015-01-02 DIAGNOSIS — N6001 Solitary cyst of right breast: Secondary | ICD-10-CM

## 2015-01-07 ENCOUNTER — Telehealth: Payer: Self-pay | Admitting: Family Medicine

## 2015-01-07 NOTE — Telephone Encounter (Signed)
Pre visit letter sent  °

## 2015-01-21 ENCOUNTER — Telehealth: Payer: Self-pay | Admitting: Family Medicine

## 2015-01-21 ENCOUNTER — Other Ambulatory Visit: Payer: Self-pay | Admitting: Family Medicine

## 2015-01-21 DIAGNOSIS — N6001 Solitary cyst of right breast: Secondary | ICD-10-CM

## 2015-01-21 DIAGNOSIS — Z803 Family history of malignant neoplasm of breast: Secondary | ICD-10-CM

## 2015-01-21 MED ORDER — AMLODIPINE BESYLATE 10 MG PO TABS: 10.0000 mg | ORAL_TABLET | Freq: Every day | ORAL | Status: DC

## 2015-01-21 NOTE — Telephone Encounter (Signed)
Medication filled, message routed to RN to have pt triaged for her stress/anxiety.

## 2015-01-21 NOTE — Telephone Encounter (Signed)
Called patient at [phone] [facility]) *Preferred* and left message to return call.

## 2015-01-21 NOTE — Telephone Encounter (Signed)
Relation to pt: self  Call back number:[phone] Pharmacy: [facility] [address] [place] [address] [phone] (Phone) [phone] (Fax)         Reason for call:  Pt is requesting a refill amLODipine (NORVASC) 10 MG tablet. Pt states she is completely out. Advised pt in the near future call a few days in advance to ensure she does not run out. Pt also mentioned she lost a love one and she is extremely stressed.

## 2015-01-24 ENCOUNTER — Other Ambulatory Visit: Payer: Medicare HMO

## 2015-01-24 ENCOUNTER — Telehealth: Payer: Self-pay | Admitting: *Deleted

## 2015-01-24 NOTE — Telephone Encounter (Signed)
Unable to reach patient at time of Pre-Visit Call.  Left message for patient to return call when available.    

## 2015-01-27 ENCOUNTER — Encounter: Payer: Self-pay | Admitting: Family Medicine

## 2015-01-27 ENCOUNTER — Ambulatory Visit (INDEPENDENT_AMBULATORY_CARE_PROVIDER_SITE_OTHER): Payer: Medicare HMO | Admitting: Family Medicine

## 2015-01-27 VITALS — BP 110/80 | HR 67 | Temp 97.9°F | Resp 16 | Ht 64.0 in | Wt 156.4 lb

## 2015-01-27 DIAGNOSIS — E785 Hyperlipidemia, unspecified: Secondary | ICD-10-CM

## 2015-01-27 DIAGNOSIS — Z0000 Encounter for general adult medical examination without abnormal findings: Secondary | ICD-10-CM | POA: Insufficient documentation

## 2015-01-27 DIAGNOSIS — N281 Cyst of kidney, acquired: Secondary | ICD-10-CM

## 2015-01-27 DIAGNOSIS — Q6102 Congenital multiple renal cysts: Secondary | ICD-10-CM | POA: Diagnosis not present

## 2015-01-27 DIAGNOSIS — I10 Essential (primary) hypertension: Secondary | ICD-10-CM

## 2015-01-27 HISTORY — DX: Cyst of kidney, acquired: N28.1

## 2015-01-27 LAB — LIPID PANEL
Cholesterol: 162 mg/dL (ref 0–200)
HDL: 48.9 mg/dL (ref >39.00)
LDL Cholesterol: 101 mg/dL — ABNORMAL HIGH (ref 0–99)
NonHDL: 113.1
Total CHOL/HDL Ratio: 3
Triglycerides: 61 mg/dL (ref 0.0–149.0)
VLDL: 12.2 mg/dL (ref 0.0–40.0)

## 2015-01-27 LAB — BASIC METABOLIC PANEL
BUN: 14 mg/dL (ref 6–23)
CO2: 29 mEq/L (ref 19–32)
Calcium: 9.4 mg/dL (ref 8.4–10.5)
Chloride: 103 mEq/L (ref 96–112)
Creatinine, Ser: 0.87 mg/dL (ref 0.40–1.20)
GFR: 82.73 mL/min (ref >60.00)
Glucose, Bld: 85 mg/dL (ref 70–99)
Potassium: 3.2 mEq/L — ABNORMAL LOW (ref 3.5–5.1)
Sodium: 137 mEq/L (ref 135–145)

## 2015-01-27 LAB — HEPATIC FUNCTION PANEL
ALT: 22 U/L (ref 0–35)
AST: 19 U/L (ref 0–37)
Albumin: 4.2 g/dL (ref 3.5–5.2)
Alkaline Phosphatase: 76 U/L (ref 39–117)
Bilirubin, Direct: 0.1 mg/dL (ref 0.0–0.3)
Total Bilirubin: 0.5 mg/dL (ref 0.2–1.2)
Total Protein: 7.2 g/dL (ref 6.0–8.3)

## 2015-01-27 LAB — TSH: TSH: 0.56 u[IU]/mL (ref 0.35–4.50)

## 2015-01-27 MED ORDER — FLUTICASONE PROPIONATE 50 MCG/ACT NA SUSP: 2.0000 | Freq: Every day | NASAL | Status: DC

## 2015-01-27 MED ORDER — CLONAZEPAM 0.5 MG PO TABS: 0.5000 mg | ORAL_TABLET | Freq: Two times a day (BID) | ORAL | Status: DC | PRN

## 2015-01-27 MED ORDER — AMLODIPINE BESYLATE 10 MG PO TABS: 10.0000 mg | ORAL_TABLET | Freq: Every day | ORAL | Status: DC

## 2015-01-27 NOTE — Assessment & Plan Note (Signed)
Chronic problem.  Well controlled.  Asymptomatic.  Check labs.  No med changes.

## 2015-01-27 NOTE — Progress Notes (Signed)
Pre visit review using our clinic review tool, if applicable. No additional management support is needed unless otherwise documented below in the visit note. 

## 2015-01-27 NOTE — Progress Notes (Signed)
   Subjective:    [name], female    DOB: Jun 05, 1946, 69 y.o.   MRN: [id number]  HPI Here today for CPE.  Risk Factors: HTN- chronic problem, on Amlodipine.  Well controlled today.  No CP, SOB, HAs, visual changes, edema. Hyperlipidemia- chronic problem, not currently on meds.  Due for labs.  Denies abd pain, N/V, myalgias. Physical Activity: pt goes fishing regularly Depression: denies current sxs but sister just died last week (breast cancer).  Other sister was just dx'd w/ breast cancer Hearing: normal to conversational tones and whispered voice at 6 ft ADL's: independent Cognitive: normal linear thought process, memory and attention intact Home Safety: safe at home Height, Weight, BMI, Visual Acuity: see vitals, vision corrected to 20/20 w/ reading glasses Counseling: UTD on mammo, colonoscopy.  Pt due for DEXA- wants to hold.  Due for Prevnar- declines Labs Ordered: See A&P Care Plan: See A&P    Review of Systems Patient reports no vision/ hearing changes, adenopathy,fever, weight change,  persistant/recurrent hoarseness , swallowing issues, chest pain, palpitations, edema, persistant/recurrent cough, hemoptysis, dyspnea (rest/exertional/paroxysmal nocturnal), gastrointestinal bleeding (melena, rectal bleeding), abdominal pain, significant heartburn, bowel changes, GU symptoms (dysuria, hematuria, incontinence), Gyn symptoms (abnormal  bleeding, pain),  syncope, focal weakness, memory loss, numbness & tingling, skin/hair/nail changes, abnormal bruising or bleeding, anxiety, or depression.     Objective:   Physical Exam General Appearance:    Alert, cooperative, no distress, appears stated age  Head:    Normocephalic, without obvious abnormality, atraumatic  Eyes:    PERRL, conjunctiva/corneas clear, EOM's intact, fundi    benign, both eyes  Ears:    Normal TM's and external ear canals, both ears  Nose:   Nares normal, septum midline, mucosa normal, no drainage      or sinus tenderness  Throat:   Lips, mucosa, and tongue normal; teeth and gums normal  Neck:   Supple, symmetrical, trachea midline, no adenopathy;    Thyroid: no enlargement/tenderness/nodules  Back:     Symmetric, no curvature, ROM normal, no CVA tenderness  Lungs:     Clear to auscultation bilaterally, respirations unlabored  Chest Wall:    No tenderness or deformity   Heart:    Regular rate and rhythm, S1 and S2 normal, no murmur, rub   or gallop  Breast Exam:    Deferred to mammo  Abdomen:     Soft, non-tender, bowel sounds active all four quadrants,    no masses, no organomegaly  Genitalia:    Deferred at pt's request  Rectal:    Extremities:   Extremities normal, atraumatic, no cyanosis or edema  Pulses:   2+ and symmetric all extremities  Skin:   Skin color, texture, turgor normal, no rashes or lesions  Lymph nodes:   Cervical, supraclavicular, and axillary nodes normal  Neurologic:   CNII-XII intact, normal strength, sensation and reflexes    throughout          Assessment & Plan:

## 2015-01-27 NOTE — Assessment & Plan Note (Signed)
New to provider.  Noted on pt's previous imaging and had recommended f/u.  Will order f/u today.

## 2015-01-27 NOTE — Assessment & Plan Note (Signed)
Pt's PE WNL.  UTD on mammo and colonoscopy.  Pt declines DEXA and Prevnar.  Written screening schedule updated and given to pt.  Check labs.  Discussed possibility of BRCA testing due to extensive family hx.  Will follow. 

## 2015-01-27 NOTE — Assessment & Plan Note (Signed)
Chronic problem.  Pt not currently on meds.  Check labs.  Start meds prn.

## 2015-01-27 NOTE — Patient Instructions (Signed)
Follow up in 6 months to recheck BP and cholesterol- sooner if needed We'll notify you of your lab results and make any changes if needed We'll call you with your kidney ultrasound appt Please call Hospice for grief counseling Consider having your family tested for the BRCA genes for breast cancer Call with any questions or concerns Hang in there!  You are in my thoughts and prayers!

## 2015-01-29 ENCOUNTER — Other Ambulatory Visit: Payer: Self-pay | Admitting: Family Medicine

## 2015-01-29 DIAGNOSIS — E876 Hypokalemia: Secondary | ICD-10-CM

## 2015-02-04 ENCOUNTER — Other Ambulatory Visit: Payer: Medicare HMO

## 2015-02-04 ENCOUNTER — Telehealth: Payer: Self-pay | Admitting: *Deleted

## 2015-02-04 NOTE — Telephone Encounter (Signed)
Spoke with pt regarding lab redraw. Pt states her son passed away . Pt requesting to be called back later currently at funeral.

## 2015-02-13 ENCOUNTER — Other Ambulatory Visit (INDEPENDENT_AMBULATORY_CARE_PROVIDER_SITE_OTHER): Payer: Medicare HMO

## 2015-02-13 DIAGNOSIS — E876 Hypokalemia: Secondary | ICD-10-CM

## 2015-02-13 LAB — BASIC METABOLIC PANEL
BUN: 15 mg/dL (ref 6–23)
CO2: 30 mEq/L (ref 19–32)
Calcium: 9.7 mg/dL (ref 8.4–10.5)
Chloride: 106 mEq/L (ref 96–112)
Creatinine, Ser: 0.85 mg/dL (ref 0.40–1.20)
GFR: 84.97 mL/min (ref >60.00)
Glucose, Bld: 83 mg/dL (ref 70–99)
Potassium: 4.1 mEq/L (ref 3.5–5.1)
Sodium: 140 mEq/L (ref 135–145)

## 2015-02-14 ENCOUNTER — Encounter: Payer: Self-pay | Admitting: General Practice

## 2015-02-19 ENCOUNTER — Ambulatory Visit
Admission: RE | Admit: 2015-02-19 | Discharge: 2015-02-19 | Disposition: A | Discharge status: Home / Self Care | Payer: Medicare HMO | Source: Ambulatory Visit | Attending: Family Medicine | Admitting: Family Medicine

## 2015-02-19 DIAGNOSIS — N6001 Solitary cyst of right breast: Secondary | ICD-10-CM

## 2015-02-19 DIAGNOSIS — Z803 Family history of malignant neoplasm of breast: Secondary | ICD-10-CM

## 2015-02-25 ENCOUNTER — Ambulatory Visit (HOSPITAL_BASED_OUTPATIENT_CLINIC_OR_DEPARTMENT_OTHER)
Admission: RE | Admit: 2015-02-25 | Discharge: 2015-02-25 | Disposition: A | Discharge status: Home / Self Care | Payer: Medicare HMO | Source: Ambulatory Visit | Attending: Family Medicine | Admitting: Family Medicine

## 2015-02-25 DIAGNOSIS — Q6102 Congenital multiple renal cysts: Secondary | ICD-10-CM | POA: Insufficient documentation

## 2015-02-25 DIAGNOSIS — N281 Cyst of kidney, acquired: Secondary | ICD-10-CM

## 2015-02-26 ENCOUNTER — Telehealth: Payer: Self-pay | Admitting: Family Medicine

## 2015-02-26 NOTE — Telephone Encounter (Signed)
Called pt and left a detailed voicemail to inform of the [name] results.

## 2015-02-26 NOTE — Telephone Encounter (Signed)
Relation to pt: self  Call back number: [phone]   Reason for call:  Pt inquiring about ultrasound results

## 2015-02-27 ENCOUNTER — Ambulatory Visit (HOSPITAL_BASED_OUTPATIENT_CLINIC_OR_DEPARTMENT_OTHER): Payer: Medicare HMO

## 2015-02-28 ENCOUNTER — Telehealth: Payer: Self-pay | Admitting: Family Medicine

## 2015-02-28 NOTE — Telephone Encounter (Signed)
Recd call from pt wanting to sched appt regarding sadness due to loss of her son and her sister recently. Scheduled appt for 03/03/15 at 10:30am. Then she stated she has crying spells and has been experiencing chest pain/pressure. Transferred call to [facility] with [employer].

## 2015-02-28 NOTE — Telephone Encounter (Signed)
[facility] - Client [place]    --------------------------------------------------------------------------------   Patient Name: Margaret Torres  DOB: [birth year]    Initial Comment Caller states she is depressed. She's also having chest pains.        Nurse Assessment  Nurse: Venetia Maxon, RN, Manuela Schwartz Date/Time (Eastern Time): 02/28/2015 4:24:32 PM  Confirm and document reason for call. If symptomatic, describe symptoms. ---Caller states she is depressed. She's also having chest pains. Her son died Feb 20, 2015 heart attack while she was on phone with him . He was very overweight . CHF. Her sister died as well 10 days prior top that . She has chest pain every day at 4pm heavy feeling . This was the same time she was talking with her son when he died. She was given Clonazepam by her PCP she takes this and sleeps well at night . She takes nighttime robitussin. She wakes up feeling terrible. and wants to cry all the time. she does not have a history of depression.    Has the patient traveled out of the country within the last 30 days? ---No    Does the patient require triage? ---Yes    Related visit to physician within the last 2 weeks? ---Yes    Does the PT have any chronic conditions? (i.e. diabetes, asthma, etc.) ---Yes    List chronic conditions. ---had U/S recently for bladder and kidneys HTN she has a church family but has not called her pastor           Guidelines      Guideline Title Affirmed Question Affirmed Notes  Chest Pain SEVERE chest pain 6/10    Final Disposition User    Go to ED Now Venetia Maxon, RN, Manuela Schwartz

## 2015-03-01 ENCOUNTER — Other Ambulatory Visit (HOSPITAL_COMMUNITY): Payer: Self-pay

## 2015-03-01 ENCOUNTER — Encounter (HOSPITAL_COMMUNITY): Payer: Self-pay | Admitting: Emergency Medicine

## 2015-03-01 ENCOUNTER — Emergency Department (HOSPITAL_COMMUNITY)
Admission: EM | Admit: 2015-03-01 | Discharge: 2015-03-01 | Disposition: A | Discharge status: Home / Self Care | Payer: Medicare HMO | Attending: Emergency Medicine | Admitting: Emergency Medicine

## 2015-03-01 ENCOUNTER — Emergency Department (HOSPITAL_COMMUNITY): Payer: Medicare HMO

## 2015-03-01 DIAGNOSIS — I10 Essential (primary) hypertension: Secondary | ICD-10-CM | POA: Diagnosis not present

## 2015-03-01 DIAGNOSIS — R079 Chest pain, unspecified: Secondary | ICD-10-CM | POA: Insufficient documentation

## 2015-03-01 DIAGNOSIS — Z7951 Long term (current) use of inhaled steroids: Secondary | ICD-10-CM | POA: Diagnosis not present

## 2015-03-01 DIAGNOSIS — Z8541 Personal history of malignant neoplasm of cervix uteri: Secondary | ICD-10-CM | POA: Insufficient documentation

## 2015-03-01 DIAGNOSIS — Z7982 Long term (current) use of aspirin: Secondary | ICD-10-CM | POA: Diagnosis not present

## 2015-03-01 DIAGNOSIS — F419 Anxiety disorder, unspecified: Secondary | ICD-10-CM | POA: Diagnosis not present

## 2015-03-01 DIAGNOSIS — Z79899 Other long term (current) drug therapy: Secondary | ICD-10-CM | POA: Diagnosis not present

## 2015-03-01 LAB — BASIC METABOLIC PANEL
Anion gap: 9 (ref 5–15)
BUN: 14 mg/dL (ref 6–20)
CO2: 24 mmol/L (ref 22–32)
Calcium: 9.1 mg/dL (ref 8.9–10.3)
Chloride: 108 mmol/L (ref 101–111)
Creatinine, Ser: 0.84 mg/dL (ref 0.44–1.00)
GFR calc Af Amer: >60 mL/min (ref >60)
GFR calc non Af Amer: >60 mL/min (ref >60)
Glucose, Bld: 104 mg/dL — ABNORMAL HIGH (ref 65–99)
Potassium: 3.5 mmol/L (ref 3.5–5.1)
Sodium: 141 mmol/L (ref 135–145)

## 2015-03-01 LAB — CBC
HCT: 33.8 % — ABNORMAL LOW (ref 36.0–46.0)
Hemoglobin: 11.4 g/dL — ABNORMAL LOW (ref 12.0–15.0)
MCH: 29.3 pg (ref 26.0–34.0)
MCHC: 33.7 g/dL (ref 30.0–36.0)
MCV: 86.9 fL (ref 78.0–100.0)
Platelets: 309 10*3/uL (ref 150–400)
RBC: 3.89 MIL/uL (ref 3.87–5.11)
RDW: 13.5 % (ref 11.5–15.5)
WBC: 5.7 10*3/uL (ref 4.0–10.5)

## 2015-03-01 LAB — I-STAT TROPONIN, ED: Troponin i, poc: 0 ng/mL (ref 0.00–0.08)

## 2015-03-01 MED ORDER — CLONAZEPAM 0.5 MG PO TABS: 0.5000 mg | ORAL_TABLET | Freq: Two times a day (BID) | ORAL | Status: DC | PRN

## 2015-03-01 MED ORDER — NITROGLYCERIN 0.4 MG SL SUBL: 0.4000 mg | SUBLINGUAL_TABLET | SUBLINGUAL | Status: DC | PRN

## 2015-03-01 NOTE — Discharge Instructions (Signed)

## 2015-03-01 NOTE — ED Notes (Addendum)
Patient coming from home with c/o of central chest pain with radiation to the left arm.  Patient states "the pain has been off and on x 1 week.  The pain starts around the same time every afternoon around 4pm and last until I take my Klonapin and Norvasc then the pain eases up."  Patient states she has been feeling dizzy and lightheaded with the chest pain.    Patient states "my son died 3 weeks ago around 4pm in the afternoon and is attributing the stress and chest pain to the death of her son".  "The pain feels like it is getting worse everyday".

## 2015-03-01 NOTE — ED Provider Notes (Signed)
CSN: [id number]     Arrival date & time 03/01/15  0610 History   First MD Initiated Contact with Patient 03/01/15 [phone]     Chief Complaint  Patient presents with  . Chest Pain      HPI Patient coming from home with c/o of central chest pain with radiation to the left arm. Patient states "the pain has been off and on x 1 week. The pain starts around the same time every afternoon around 4pm and last until I take my Klonapin and Norvasc then the pain eases up." Patient states she has been feeling dizzy and lightheaded with the chest pain.  Past Medical History  Diagnosis Date  . Hypertension   . Anxiety   . Cervical cancer    Past Surgical History  Procedure Laterality Date  . Abdominal hysterectomy    . Wisdom tooth extraction Bilateral    Family History  Problem Relation Age of Onset  . Cervical cancer Mother   . Hypertension Mother   . Hypertension Father   . Diabetes Sister   . Stroke Sister   . Cancer Sister     bone cancer  . Heart disease Brother   . Diabetes Brother   . Stroke Maternal Grandmother   . Cancer Other    History  Substance Use Topics  . Smoking status: Never Smoker   . Smokeless tobacco: Never Used  . Alcohol Use: No   OB History    No data available     Review of Systems  All other systems reviewed and are negative  Allergies  Review of patient's allergies indicates no known allergies.  Home Medications   Prior to Admission medications   Medication Sig Start Date End Date Taking? Authorizing Provider  amLODipine (NORVASC) 10 MG tablet Take 1 tablet (10 mg total) by mouth daily. 01/27/15  Yes Midge Minium, MD  aspirin EC 81 MG tablet Take 81 mg by mouth 3 (three) times a week.   Yes Historical Provider, MD  fluticasone (FLONASE) 50 MCG/ACT nasal spray Place 2 sprays into both nostrils daily. 01/27/15  Yes Midge Minium, MD  guaiFENesin (ROBITUSSIN) 100 MG/5ML SOLN Take 15 mLs by mouth at bedtime.   Yes Historical Provider, MD   clonazePAM (KLONOPIN) 0.5 MG tablet Take 1 tablet (0.5 mg total) by mouth 2 (two) times daily as needed. 03/01/15   Leonard Schwartz, MD  nitroGLYCERIN (NITROSTAT) 0.4 MG SL tablet Place 1 tablet (0.4 mg total) under the tongue every 5 (five) minutes as needed for chest pain. 03/01/15   Leonard Schwartz, MD   BP 133/80 mmHg  Pulse 62  Temp(Src) 98.6 F (37 C) (Oral)  Resp 16  Ht 5\' 4"  (1.626 m)  Wt 158 lb (71.668 kg)  BMI 27.11 kg/m2  SpO2 99%  LMP 06/30/2013 Physical Exam Physical Exam  Nursing note and vitals reviewed. Constitutional: She is oriented to person, place, and time. She appears well-developed and well-nourished. No distress.  HENT:  Head: Normocephalic and atraumatic.  Eyes: Pupils are equal, round, and reactive to light.  Neck: Normal range of motion.  Cardiovascular: Normal rate and intact distal pulses.   Pulmonary/Chest: No respiratory distress.  Abdominal: Normal appearance. She exhibits no distension.  Musculoskeletal: Normal range of motion.  Neurological: She is alert and oriented to person, place, and time. No cranial nerve deficit.  Skin: Skin is warm and dry. No rash noted.  Psychiatric: She has a normal mood and affect. Her behavior is normal.  ED Course  Procedures (including critical care time) Labs Review Labs Reviewed  CBC - Abnormal; Notable for the following:    Hemoglobin 11.4 (*)    HCT 33.8 (*)    All other components within normal limits  BASIC METABOLIC PANEL - Abnormal; Notable for the following:    Glucose, Bld 104 (*)    All other components within normal limits  Randolm Idol, ED    Imaging Review Dg Chest Port 1 View  03/01/2015   CLINICAL DATA:  Centralized chest pain for 1 week.  EXAM: PORTABLE CHEST - 1 VIEW  COMPARISON:  None.  FINDINGS: Multiple overlying monitoring devices. The cardiomediastinal contours are normal. The lungs are clear. Pulmonary vasculature is normal. No consolidation, pleural effusion, or pneumothorax. No  acute osseous abnormalities are seen.  IMPRESSION: No acute pulmonary process.   Electronically Signed   By: Jeb Levering M.D.   On: 03/01/2015 06:55   Rate=60 Sinus rhythm RBBB and LAFB No significant change since last tracing Confirmed by OTTER MD, OLGA ([id number]) on 03/01/2015 6:26:37 AM I recommended that the patient stay for further workup but she declined.  She has an appointment with her primary care doctor on Monday.  Instructed her take 1 aspirin daily and will prescribe some nitroglycerin for her to have at home. MDM   Final diagnoses:  Chest pain, unspecified chest pain type        Leonard Schwartz, MD 03/02/15 [phone]

## 2015-03-03 ENCOUNTER — Encounter: Payer: Self-pay | Admitting: Family Medicine

## 2015-03-03 ENCOUNTER — Ambulatory Visit (INDEPENDENT_AMBULATORY_CARE_PROVIDER_SITE_OTHER): Payer: Medicare HMO | Admitting: Family Medicine

## 2015-03-03 VITALS — BP 110/78 | HR 71 | Temp 98.1°F | Resp 16 | Wt 159.0 lb

## 2015-03-03 DIAGNOSIS — F418 Other specified anxiety disorders: Secondary | ICD-10-CM

## 2015-03-03 DIAGNOSIS — F419 Anxiety disorder, unspecified: Secondary | ICD-10-CM | POA: Diagnosis not present

## 2015-03-03 DIAGNOSIS — F32A Depression, unspecified: Secondary | ICD-10-CM

## 2015-03-03 DIAGNOSIS — F329 Major depressive disorder, single episode, unspecified: Secondary | ICD-10-CM

## 2015-03-03 MED ORDER — SERTRALINE HCL 25 MG PO TABS: 25.0000 mg | ORAL_TABLET | Freq: Every day | ORAL | Status: DC

## 2015-03-03 NOTE — Telephone Encounter (Signed)
Patient went to [facility] ED.

## 2015-03-03 NOTE — Progress Notes (Signed)
   Subjective:    [name], female    DOB: Mar 07, 1946, 69 y.o.   MRN: [id number]  HPI Anxiety/depression- pt lost her sister 4/7, lost oldest son at age 31 on 4/22 to massive heart attack.  Had previously lost 17 yr old niece this year.  Pt was on the phone w/ son when he passed away.  Son left 3 daughters.  Pt went to ER on 5/21 for chest pain- normal labs, EKG unchanged from previous.  Pt reports sxs improve w/ klonopin.  Pt reports son was her POA, emergency contact, Estate manager/land agent.  Pt reports daily CP between 4-5pm (which is when she was on the phone w/ her son and he passed away).  She reports pain is improving.   Review of Systems For ROS see HPI     Objective:   Physical Exam  Constitutional: She is oriented to person, place, and time. She appears well-developed and well-nourished. No distress.  HENT:  Head: Normocephalic and atraumatic.  Neck: Normal range of motion. Neck supple.  Cardiovascular: Normal rate, regular rhythm, normal heart sounds and intact distal pulses.   Pulmonary/Chest: Effort normal and breath sounds normal. No respiratory distress. She has no wheezes. She has no rales.  Musculoskeletal: She exhibits no edema.  Lymphadenopathy:    She has no cervical adenopathy.  Neurological: She is alert and oriented to person, place, and time. No cranial nerve deficit. Coordination normal.  Skin: Skin is warm and dry.  Psychiatric: Her behavior is normal. Judgment and thought content normal.  Tearful, appropriately upset when speaking about her son's death  Vitals reviewed.         Assessment & Plan:

## 2015-03-03 NOTE — Progress Notes (Signed)
Pre visit review using our clinic review tool, if applicable. No additional management support is needed unless otherwise documented below in the visit note. 

## 2015-03-03 NOTE — Assessment & Plan Note (Signed)
Deteriorated.  Pt's sxs are consistent w/ grief at the sudden and traumatic loss of her son.  She finds some relief w/ the Klonopin but would like to try a daily SSRI for improved symptom relief.  Pt plans to contact Hospice for grief counseling and support groups- # provided.  Pt denies SI/HI.  No current CP, SOB.  She is aware that her pain is psychosomatic after normal ER work up.  Will start low dose Zoloft.  Continue Klonopin prn.  Offered my support and will follow closely.

## 2015-03-03 NOTE — Patient Instructions (Signed)
Follow up in 3-4 weeks to recheck mood Start the Zoloft daily for anxiety/depression Continue the clonazepam as needed for those high stress moments Please call Hospice for grief counseling- [phone] Call with any questions or concerns We are here for you!!!  Hang in there!!

## 2015-04-07 ENCOUNTER — Ambulatory Visit: Payer: Medicare HMO | Admitting: Family Medicine

## 2015-04-07 DIAGNOSIS — Z0289 Encounter for other administrative examinations: Secondary | ICD-10-CM

## 2015-04-08 ENCOUNTER — Telehealth: Payer: Self-pay | Admitting: Family Medicine

## 2015-04-08 NOTE — Telephone Encounter (Signed)
Pt was no show 04/07/15 1:15pm, follow up 15 appt, rescheduled for 04/28/15, charge?

## 2015-04-09 NOTE — Telephone Encounter (Signed)
Yes- please charge 

## 2015-04-28 ENCOUNTER — Encounter: Payer: Self-pay | Admitting: Family Medicine

## 2015-04-28 ENCOUNTER — Ambulatory Visit (INDEPENDENT_AMBULATORY_CARE_PROVIDER_SITE_OTHER): Payer: Medicare HMO | Admitting: Family Medicine

## 2015-04-28 VITALS — BP 110/80 | HR 72 | Temp 98.0°F | Resp 16 | Ht 64.0 in | Wt 157.5 lb

## 2015-04-28 DIAGNOSIS — F32A Depression, unspecified: Secondary | ICD-10-CM

## 2015-04-28 DIAGNOSIS — K625 Hemorrhage of anus and rectum: Secondary | ICD-10-CM | POA: Diagnosis not present

## 2015-04-28 DIAGNOSIS — F419 Anxiety disorder, unspecified: Principal | ICD-10-CM

## 2015-04-28 DIAGNOSIS — F329 Major depressive disorder, single episode, unspecified: Secondary | ICD-10-CM

## 2015-04-28 DIAGNOSIS — F418 Other specified anxiety disorders: Secondary | ICD-10-CM | POA: Diagnosis not present

## 2015-04-28 MED ORDER — PRAZOSIN HCL 2 MG PO CAPS: 2.0000 mg | ORAL_CAPSULE | Freq: Every day | ORAL | Status: DC

## 2015-04-28 NOTE — Progress Notes (Signed)
   Subjective:    [name], female    DOB: December 05, 1945, 69 y.o.   MRN: [id number]  HPI Anxiety- pt was seen 5/23 after the sudden loss of her son.  Pt reports she was unable to take Zoloft due to palpitations.  'i'm trying to take things one day at a time'.  'some days I have a good day and some days I have a day that's so bad.  All I do is cry.'  'i spend 85% of my time alone.  Pt is looking for a support group for grief counseling.  Pt is now struggling w/ house and bills b/c her son would regularly help her w/ this.  Reports she is feeling similarly to when her husband passed, 'and I was on the verge of a nervous breakdown'.  Pt's daughter-in-law gave her 2 Prazosin to take prior to bed- pt reports she slept well.  Pt has large bleeding hemorrhoid but reports she is unable to see GI b/c of the high copay.  Reports loose, foul smelling stools w/ blood.  Feels bowels have been loose due to stress.    Review of Systems For ROS see HPI     Objective:   Physical Exam  Constitutional: She is oriented to person, place, and time. She appears well-developed and well-nourished. No distress.  HENT:  Head: Normocephalic and atraumatic.  Cardiovascular: Normal rate, regular rhythm and normal heart sounds.   Pulmonary/Chest: Effort normal and breath sounds normal. No respiratory distress. She has no wheezes. She has no rales.  Abdominal: Soft. Bowel sounds are normal. She exhibits no distension. There is no tenderness. There is no rebound and no guarding.  Neurological: She is alert and oriented to person, place, and time.  Skin: Skin is warm and dry.  Psychiatric:  Flat, withdrawn  Vitals reviewed.         Assessment & Plan:

## 2015-04-28 NOTE — Patient Instructions (Signed)
Follow up in 2-3 weeks to recheck mood/sleep Start the Prazosin at night before bed Move the Amlodipine to morning Call and speak w/ Hospice about grief counseling We'll call you with your GI appt Call with any questions or concerns Hang in there!!!

## 2015-04-28 NOTE — Progress Notes (Signed)
Pre visit review using our clinic review tool, if applicable. No additional management support is needed unless otherwise documented below in the visit note. 

## 2015-04-29 NOTE — Assessment & Plan Note (Signed)
Ongoing.  Pt is grieving and moderately to severely depressed.  Again stressed need for grief counseling- pt states there is a support group forming in Sept.  Was unable to tolerate Zoloft.  Denies SI/HI.  Reports she felt much better after taking daughter-in-law's Prazosin prior to bed b/c she slept well and did not wake feeling 'so shaky and upset'.  As this does have a use for PTSD related sleep disturbances, we'll start this medication and monitor closely for improvement.  Reviewed supportive care and red flags that should prompt return.  Pt expressed understanding and is in agreement w/ plan.

## 2015-04-29 NOTE — Assessment & Plan Note (Signed)
Pt has hx of large, external bleeding hemorrhoid.  But due to recent bowel changes and increased blood, pt needs to see GI doctor for complete evaluation and tx.  Referral placed.  Will follow.

## 2015-05-20 ENCOUNTER — Encounter: Payer: Self-pay | Admitting: Family Medicine

## 2015-05-20 ENCOUNTER — Ambulatory Visit (INDEPENDENT_AMBULATORY_CARE_PROVIDER_SITE_OTHER): Payer: Medicare HMO | Admitting: Family Medicine

## 2015-05-20 VITALS — BP 100/68 | HR 72 | Temp 97.9°F | Resp 16 | Ht 64.0 in | Wt 157.5 lb

## 2015-05-20 DIAGNOSIS — F32A Depression, unspecified: Secondary | ICD-10-CM

## 2015-05-20 DIAGNOSIS — R42 Dizziness and giddiness: Secondary | ICD-10-CM

## 2015-05-20 DIAGNOSIS — I10 Essential (primary) hypertension: Secondary | ICD-10-CM | POA: Diagnosis not present

## 2015-05-20 DIAGNOSIS — F418 Other specified anxiety disorders: Secondary | ICD-10-CM

## 2015-05-20 DIAGNOSIS — F329 Major depressive disorder, single episode, unspecified: Secondary | ICD-10-CM

## 2015-05-20 DIAGNOSIS — F419 Anxiety disorder, unspecified: Secondary | ICD-10-CM

## 2015-05-20 MED ORDER — MECLIZINE HCL 25 MG PO TABS: 25.0000 mg | ORAL_TABLET | Freq: Three times a day (TID) | ORAL | Status: DC | PRN

## 2015-05-20 MED ORDER — CLONAZEPAM 0.5 MG PO TABS: 0.5000 mg | ORAL_TABLET | Freq: Two times a day (BID) | ORAL | Status: DC | PRN

## 2015-05-20 MED ORDER — ESCITALOPRAM OXALATE 5 MG PO TABS: 5.0000 mg | ORAL_TABLET | Freq: Every day | ORAL | Status: DC

## 2015-05-20 NOTE — Assessment & Plan Note (Signed)
Chronic problem.  BP is now running low w/ addition of Prazosin for night terrors.  Decrease Amlodipine to 1/2 tab daily and continue to monitor.  Pt expressed understanding and is in agreement w/ plan.

## 2015-05-20 NOTE — Progress Notes (Signed)
   Subjective:    [name], female    DOB: 1946-10-03, 69 y.o.   MRN: [id number]  HPI Dizziness- pt reports since Saturday she is waking up w/ dizziness.  Pt reports sxs are worse w/ turning her head, bending over, changing positions.  Dizziness is not related to starting new medication.  Has hx of vertigo.  BP is also low since starting Prazosin in combo w/ amlodipine.  Denies weakness/numbness.  Anxiety/depression- pt reports that since starting the Prazosin for night terrors she is having worsening memory issues.  Says the memory issues predated the medication but it worsened w/ the medication.  Pt is also taking the clonazepam daily in the AM (taking daily since 1987).  Pt was still waking at 3-4 AM but 'i felt less depressed on the Prazosin.  Pt starts a support group on 8/22.  Pt did not tolerate Zoloft due to chest palpitations.  Is willing to try something else as this is all she's been on.   Review of Systems For ROS see HPI     Objective:   Physical Exam  Constitutional: She is oriented to person, place, and time. She appears well-developed and well-nourished. No distress.  HENT:  Head: Normocephalic and atraumatic.  Mouth/Throat: Uvula is midline and mucous membranes are normal.  TMs WNL No TTP over sinuses Minimal nasal congestion  Eyes: Conjunctivae and EOM are normal. Pupils are equal, round, and reactive to light.  2-3 beats of horizontal nystagmus  Neck: Normal range of motion. Neck supple.  Cardiovascular: Normal rate, regular rhythm, normal heart sounds and intact distal pulses.   Pulmonary/Chest: Effort normal and breath sounds normal. No respiratory distress. She has no wheezes. She has no rales.  Musculoskeletal: She exhibits no edema.  Lymphadenopathy:    She has no cervical adenopathy.  Neurological: She is alert and oriented to person, place, and time. She has normal reflexes. No cranial nerve deficit.  Skin: Skin is warm and dry.  Psychiatric:  She has a normal mood and affect. Her behavior is normal. Judgment and thought content normal.  Vitals reviewed.         Assessment & Plan:

## 2015-05-20 NOTE — Patient Instructions (Signed)
Follow up in 3-4 weeks to recheck mood and BP DECREASE the Amlodipine to 1/2 tab daily Start the Lexapro 5mg  in the morning Continue the Prazosin at night for the night terrors Use the clonazepam as needed for anxiety Use the meclizine as needed for the dizziness Increase your water intake! Change positions slowly to allow yourself time to adjust Call with any questions or concerns Hang in there!!!

## 2015-05-20 NOTE — Assessment & Plan Note (Signed)
Recurrent problem for pt.  Hx of vertigo.  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  Restart meclizine prn.  Encouraged increased fluid intake.  In case she also has a component of orthostatic hypotension, will decrease amlodipine to 1/2 tab.  Pt expressed understanding and is in agreement w/ plan.

## 2015-05-20 NOTE — Progress Notes (Signed)
Pre visit review using our clinic review tool, if applicable. No additional management support is needed unless otherwise documented below in the visit note. 

## 2015-05-20 NOTE — Assessment & Plan Note (Signed)
Improving.  Pt is excited about joining her support group.  Sleep is also better since starting the Prazosin- no longer having night terrors.  Did not tolerate Zoloft previously due to palpitations but has not tried any other SSRIs.  Will start low dose Lexapro and monitor closely.

## 2015-06-20 ENCOUNTER — Ambulatory Visit (INDEPENDENT_AMBULATORY_CARE_PROVIDER_SITE_OTHER): Payer: Medicare HMO | Admitting: Family Medicine

## 2015-06-20 ENCOUNTER — Encounter: Payer: Self-pay | Admitting: Family Medicine

## 2015-06-20 VITALS — BP 110/70 | HR 67 | Temp 98.1°F | Resp 16 | Ht 64.0 in | Wt 160.5 lb

## 2015-06-20 DIAGNOSIS — I10 Essential (primary) hypertension: Secondary | ICD-10-CM

## 2015-06-20 DIAGNOSIS — F418 Other specified anxiety disorders: Secondary | ICD-10-CM

## 2015-06-20 DIAGNOSIS — J302 Other seasonal allergic rhinitis: Secondary | ICD-10-CM

## 2015-06-20 DIAGNOSIS — B354 Tinea corporis: Secondary | ICD-10-CM | POA: Insufficient documentation

## 2015-06-20 DIAGNOSIS — F32A Depression, unspecified: Secondary | ICD-10-CM

## 2015-06-20 DIAGNOSIS — F329 Major depressive disorder, single episode, unspecified: Secondary | ICD-10-CM

## 2015-06-20 DIAGNOSIS — F419 Anxiety disorder, unspecified: Secondary | ICD-10-CM

## 2015-06-20 MED ORDER — CLOTRIMAZOLE-BETAMETHASONE 1-0.05 % EX CREA: 1.0000 "application " | TOPICAL_CREAM | Freq: Two times a day (BID) | CUTANEOUS | Status: DC

## 2015-06-20 MED ORDER — PREDNISONE 10 MG PO TABS: ORAL_TABLET | ORAL | Status: DC

## 2015-06-20 NOTE — Progress Notes (Signed)
Pre visit review using our clinic review tool, if applicable. No additional management support is needed unless otherwise documented below in the visit note. 

## 2015-06-20 NOTE — Patient Instructions (Signed)
Schedule an appt in October to recheck cholesterol Take the Prednisone as directed- 3 pills for 3 days, 2 pills for 3 days, 1 pill for 3 days.  Take w/ food Continue the Flonase daily ADD OTC Zyrtec to improve nasal congestion/post-nasal drip Drink plenty of fluids REST! Apply the Lotrisone cream twice daily to spots on arms Call with any questions or concerns Have a great weekend!

## 2015-06-20 NOTE — Progress Notes (Signed)
   Subjective:    [name], female    DOB: [birth year], 69 y.o.   MRN: [id number]  HPI Depression- ongoing problem.  Pt started support group and 'that's helping a lot'.  Also joined a group at church and this improved pt's mood.  Not currently on Lexapro.  No longer on the Prazosin at night but she reports she is sleeping 'very well'.  HTN- pt was instructed to decrease to 1/2 tab of amlodipine but she continues to take 1 tab daily.  Denies symptomatic hypotension.  'my sinuses'- complains of sinus HA.  last week had ear pain, dizziness, 'i thought my head was going to explode'.  Pt reports recurrent sinus infxns.  Using Flonase- 'every morning'.  Not taking Zyrtec at this time.  Skin lesions- 1st noticed 5 weeks ago after a mosquito bite.  Pt reports lesions will dry, flare, 'like a ring worm'.  Very itchy.     Review of Systems For ROS see HPI     Objective:   Physical Exam  Constitutional: She appears well-developed and well-nourished. No distress.  HENT:  Head: Normocephalic and atraumatic.  Right Ear: Tympanic membrane normal.  Left Ear: Tympanic membrane normal.  Nose: Mucosal edema and rhinorrhea present. Right sinus exhibits no maxillary sinus tenderness and no frontal sinus tenderness. Left sinus exhibits no maxillary sinus tenderness and no frontal sinus tenderness.  Mouth/Throat: Mucous membranes are normal. Posterior oropharyngeal erythema (w/ PND) present.  Eyes: Conjunctivae and EOM are normal. Pupils are equal, round, and reactive to light.  Neck: Normal range of motion. Neck supple.  Cardiovascular: Normal rate, regular rhythm and normal heart sounds.   Pulmonary/Chest: Effort normal and breath sounds normal. No respiratory distress. She has no wheezes. She has no rales.  Lymphadenopathy:    She has no cervical adenopathy.  Neurological: She is alert.  Skin: Skin is warm and dry.  Dry, hypopigmented circular lesions on L forearm  Psychiatric: She  has a normal mood and affect. Her behavior is normal.  Vitals reviewed.         Assessment & Plan:

## 2015-06-21 NOTE — Assessment & Plan Note (Signed)
Improved since joining support group and social group at church.  Is not taking SSRI nor using the nightly prazosin.  Will continue to follow.

## 2015-06-21 NOTE — Assessment & Plan Note (Signed)
New.  Pt's arm lesion appears consistent w/ ringworm.  Due to the itching, will start combo antifungal/steroid cream.  Reviewed supportive care and red flags that should prompt return.  Pt expressed understanding and is in agreement w/ plan.

## 2015-06-21 NOTE — Assessment & Plan Note (Signed)
Well controlled.  Pt is taking full tab of Amlodipine despite discussing at last visit that she should decrease to 1/2 tab.  Asymptomatic.  No changes at this time.

## 2015-06-21 NOTE — Assessment & Plan Note (Signed)
Deteriorated.  No evidence of current infxn.  Stressed need for regular use of nasal steroid and daily antihistamine.  Will do prednisone taper to combat inflammation.  Reviewed supportive care and red flags that should prompt return.  Pt expressed understanding and is in agreement w/ plan.

## 2015-06-30 ENCOUNTER — Other Ambulatory Visit: Payer: Self-pay

## 2015-06-30 ENCOUNTER — Other Ambulatory Visit: Payer: Self-pay | Admitting: Family Medicine

## 2015-06-30 DIAGNOSIS — N63 Unspecified lump in unspecified breast: Secondary | ICD-10-CM

## 2015-07-21 ENCOUNTER — Ambulatory Visit (INDEPENDENT_AMBULATORY_CARE_PROVIDER_SITE_OTHER): Payer: Medicare HMO | Admitting: Family Medicine

## 2015-07-21 ENCOUNTER — Encounter: Payer: Self-pay | Admitting: Family Medicine

## 2015-07-21 VITALS — BP 132/74 | HR 68 | Temp 98.4°F | Resp 16 | Ht 64.0 in | Wt 160.1 lb

## 2015-07-21 DIAGNOSIS — E785 Hyperlipidemia, unspecified: Secondary | ICD-10-CM | POA: Diagnosis not present

## 2015-07-21 MED ORDER — CLONAZEPAM 0.5 MG PO TABS: 0.5000 mg | ORAL_TABLET | Freq: Two times a day (BID) | ORAL | Status: DC | PRN

## 2015-07-21 NOTE — Patient Instructions (Signed)
Schedule your complete physical in 6 months We'll notify you of your lab results and make any changes if needed Keep up the good work!  You look great! Call with any questions or concerns If you want to join us at the new [place] office, any scheduled appointments will automatically transfer and we will see you at [address], [place],  [address] Happy Fall!!! 

## 2015-07-21 NOTE — Progress Notes (Signed)
   Subjective:    [name], female    DOB: [birth year], 69 y.o.   MRN: [id number]  HPI Hyperlipidemia- pt has hx of this but has been attempting to control w/ healthy diet and regular activity.  Pt has gained some weight since August visit.  No CP, SOB, HAs, visual changes, abd pain, N/V.  Pt is doing yard work regularly but no formal exercise.   Review of Systems For ROS see HPI     Objective:   Physical Exam  Constitutional: She is oriented to person, place, and time. She appears well-developed and well-nourished. No distress.  HENT:  Head: Normocephalic and atraumatic.  Eyes: Conjunctivae and EOM are normal. Pupils are equal, round, and reactive to light.  Neck: Normal range of motion. Neck supple. No thyromegaly present.  Cardiovascular: Normal rate, regular rhythm, normal heart sounds and intact distal pulses.   No murmur heard. Pulmonary/Chest: Effort normal and breath sounds normal. No respiratory distress.  Abdominal: Soft. She exhibits no distension. There is no tenderness.  Musculoskeletal: She exhibits no edema.  Lymphadenopathy:    She has no cervical adenopathy.  Neurological: She is alert and oriented to person, place, and time.  Skin: Skin is warm and dry.  Psychiatric: She has a normal mood and affect. Her behavior is normal.  Vitals reviewed.         Assessment & Plan:

## 2015-07-21 NOTE — Progress Notes (Signed)
Pre visit review using our clinic review tool, if applicable. No additional management support is needed unless otherwise documented below in the visit note/SLS  

## 2015-07-21 NOTE — Assessment & Plan Note (Signed)
Chronic problem.  Pt has been attempting to control w/ healthy diet, regular exercise.  Not currently on meds.  Check labs.  Start meds prn.  Reviewed importance of continued healthy diet/exercise.  Will follow.

## 2015-07-22 LAB — HEPATIC FUNCTION PANEL
ALT: 21 U/L (ref 0–35)
AST: 19 U/L (ref 0–37)
Albumin: 4.1 g/dL (ref 3.5–5.2)
Alkaline Phosphatase: 66 U/L (ref 39–117)
Bilirubin, Direct: 0.1 mg/dL (ref 0.0–0.3)
Total Bilirubin: 0.3 mg/dL (ref 0.2–1.2)
Total Protein: 7.1 g/dL (ref 6.0–8.3)

## 2015-07-22 LAB — LIPID PANEL
Cholesterol: 155 mg/dL (ref 0–200)
HDL: 49.6 mg/dL (ref >39.00)
LDL Cholesterol: 92 mg/dL (ref 0–99)
NonHDL: 105.31
Total CHOL/HDL Ratio: 3
Triglycerides: 68 mg/dL (ref 0.0–149.0)
VLDL: 13.6 mg/dL (ref 0.0–40.0)

## 2015-07-22 LAB — BASIC METABOLIC PANEL
BUN: 14 mg/dL (ref 6–23)
CO2: 27 mEq/L (ref 19–32)
Calcium: 9.4 mg/dL (ref 8.4–10.5)
Chloride: 105 mEq/L (ref 96–112)
Creatinine, Ser: 0.85 mg/dL (ref 0.40–1.20)
GFR: 84.87 mL/min (ref >60.00)
Glucose, Bld: 73 mg/dL (ref 70–99)
Potassium: 4.2 mEq/L (ref 3.5–5.1)
Sodium: 140 mEq/L (ref 135–145)

## 2015-07-23 ENCOUNTER — Encounter: Payer: Self-pay | Admitting: General Practice

## 2015-08-13 ENCOUNTER — Ambulatory Visit
Admission: RE | Admit: 2015-08-13 | Discharge: 2015-08-13 | Disposition: A | Discharge status: Home / Self Care | Payer: Medicare HMO | Source: Ambulatory Visit | Attending: Family Medicine | Admitting: Family Medicine

## 2015-08-13 DIAGNOSIS — N63 Unspecified lump in unspecified breast: Secondary | ICD-10-CM

## 2015-09-01 ENCOUNTER — Encounter: Payer: Self-pay | Admitting: Family

## 2015-09-01 ENCOUNTER — Ambulatory Visit (INDEPENDENT_AMBULATORY_CARE_PROVIDER_SITE_OTHER): Payer: Medicare HMO | Admitting: Family

## 2015-09-01 VITALS — BP 153/78 | HR 88 | Temp 100.1°F | Resp 16 | Ht 64.0 in | Wt 160.0 lb

## 2015-09-01 DIAGNOSIS — R05 Cough: Secondary | ICD-10-CM

## 2015-09-01 DIAGNOSIS — J209 Acute bronchitis, unspecified: Secondary | ICD-10-CM

## 2015-09-01 DIAGNOSIS — J019 Acute sinusitis, unspecified: Secondary | ICD-10-CM

## 2015-09-01 DIAGNOSIS — R059 Cough, unspecified: Secondary | ICD-10-CM

## 2015-09-01 LAB — POCT INFLUENZA A/B: Influenza A+B Virus Ag-Direct(Rapid): NEGATIVE

## 2015-09-01 MED ORDER — ALBUTEROL SULFATE HFA 108 (90 BASE) MCG/ACT IN AERS: 2.0000 | INHALATION_SPRAY | Freq: Four times a day (QID) | RESPIRATORY_TRACT | Status: DC | PRN

## 2015-09-01 MED ORDER — BENZONATATE 100 MG PO CAPS: 100.0000 mg | ORAL_CAPSULE | Freq: Three times a day (TID) | ORAL | Status: DC | PRN

## 2015-09-01 MED ORDER — CEFDINIR 300 MG PO CAPS: 300.0000 mg | ORAL_CAPSULE | Freq: Two times a day (BID) | ORAL | Status: DC

## 2015-09-01 NOTE — Progress Notes (Signed)
Pre visit review using our clinic review tool, if applicable. No additional management support is needed unless otherwise documented below in the visit note. 

## 2015-09-01 NOTE — Patient Instructions (Signed)
Please start omnicef (cefdinir) antibiotic for sinusitis and bronchitis. Start albuterol inhaler 2 puffs every 6 hours as needed for wheezing. Add tessalon 3 x daily as needed for cough. Call if your symptoms worsen, or if symptoms are not improved in 3 days.

## 2015-09-01 NOTE — Progress Notes (Signed)
Subjective:    [name], female    DOB: 30-May-1946, 69 y.o.   MRN: [id number]  HPI  Margaret Torres is a 69 yr old female who presents toay with chief complaint of cough. Reports that cough started 3 weeks ago.  Cough is productive of dark yellow phlegm (x3 days)- previously was white.  Has had some streaks of blood in the nasal drainage.  Nasal drainage is yellow with streaks of blood.  She has not had a flu shot. Reports that in 2000 had a reaction to the flu shot (thinks she broke out in rash and tongue swelling). Sister died day after she had the flu shot.    Review of Systems    see HPI  Past Medical History  Diagnosis Date  . Hypertension   . Anxiety   . Cervical cancer ([place])   . Depression     Social History   Social History  . Marital Status: Divorced    Spouse Name: N/A  . Number of Children: N/A  . Years of Education: N/A   Occupational History  . Not on file.   Social History Main Topics  . Smoking status: Never Smoker   . Smokeless tobacco: Never Used  . Alcohol Use: No  . Drug Use: No  . Sexual Activity: Not Currently    Birth Control/ Protection: Post-menopausal   Other Topics Concern  . Not on file   Social History Narrative    Past Surgical History  Procedure Laterality Date  . Abdominal hysterectomy    . Wisdom tooth extraction Bilateral     Family History  Problem Relation Age of Onset  . Cervical cancer Mother   . Hypertension Mother   . Hypertension Father   . Diabetes Sister   . Stroke Sister   . Cancer Sister     bone cancer  . Heart disease Brother   . Diabetes Brother   . Stroke Maternal Grandmother   . Cancer Other     No Known Allergies  Current Outpatient Prescriptions on File Prior to Visit  Medication Sig Dispense Refill  . amLODipine (NORVASC) 10 MG tablet Take 1 tablet (10 mg total) by mouth daily. 30 tablet 6  . aspirin EC 81 MG tablet Take 81 mg by mouth 3 (three) times a week.    . clonazePAM  (KLONOPIN) 0.5 MG tablet Take 1 tablet (0.5 mg total) by mouth 2 (two) times daily as needed. 30 tablet 2   No current facility-administered medications on file prior to visit.    BP 153/78 mmHg  Pulse 88  Temp(Src) 100.1 F (37.8 C) (Oral)  Resp 16  Ht 5\' 4"  (1.626 m)  Wt 160 lb (72.576 kg)  BMI 27.45 kg/m2  SpO2 100%  LMP 06/30/2013    Objective:   Physical Exam  Constitutional: She is oriented to person, place, and time. She appears well-developed and well-nourished.  HENT:  Head: Normocephalic and atraumatic.  Right Ear: Tympanic membrane and ear canal normal.  Left Ear: Tympanic membrane and ear canal normal.  Mouth/Throat: No oropharyngeal exudate or posterior oropharyngeal edema.  Eyes: No scleral icterus.  Cardiovascular: Normal rate, regular rhythm and normal heart sounds.   No murmur heard. Pulmonary/Chest: Effort normal. No respiratory distress.  Coarse rhonchi and wheeze noted with cough.   Musculoskeletal: She exhibits no edema.  Lymphadenopathy:    She has no cervical adenopathy.  Neurological: She is alert and oriented to person, place, and time.  Skin:  Skin is warm and dry.  Psychiatric: She has a normal mood and affect. Her behavior is normal. Judgment and thought content normal.          Assessment & Plan:  Sinusitis/bronchitis- with low grade fever. Advised pt as follows:

## 2015-09-02 IMAGING — CR DG ABDOMEN 1V
1 series · 1 of 1 positions shown · non-contrast
Comparison: CT Abdomen and Pelvis 12/15/2009.

CLINICAL DATA: 67-year-old female with bloating and abdominal pain.
Low back pain. Frequent urination. Initial encounter.

EXAM:
ABDOMEN - 1 VIEW

[view not recorded]
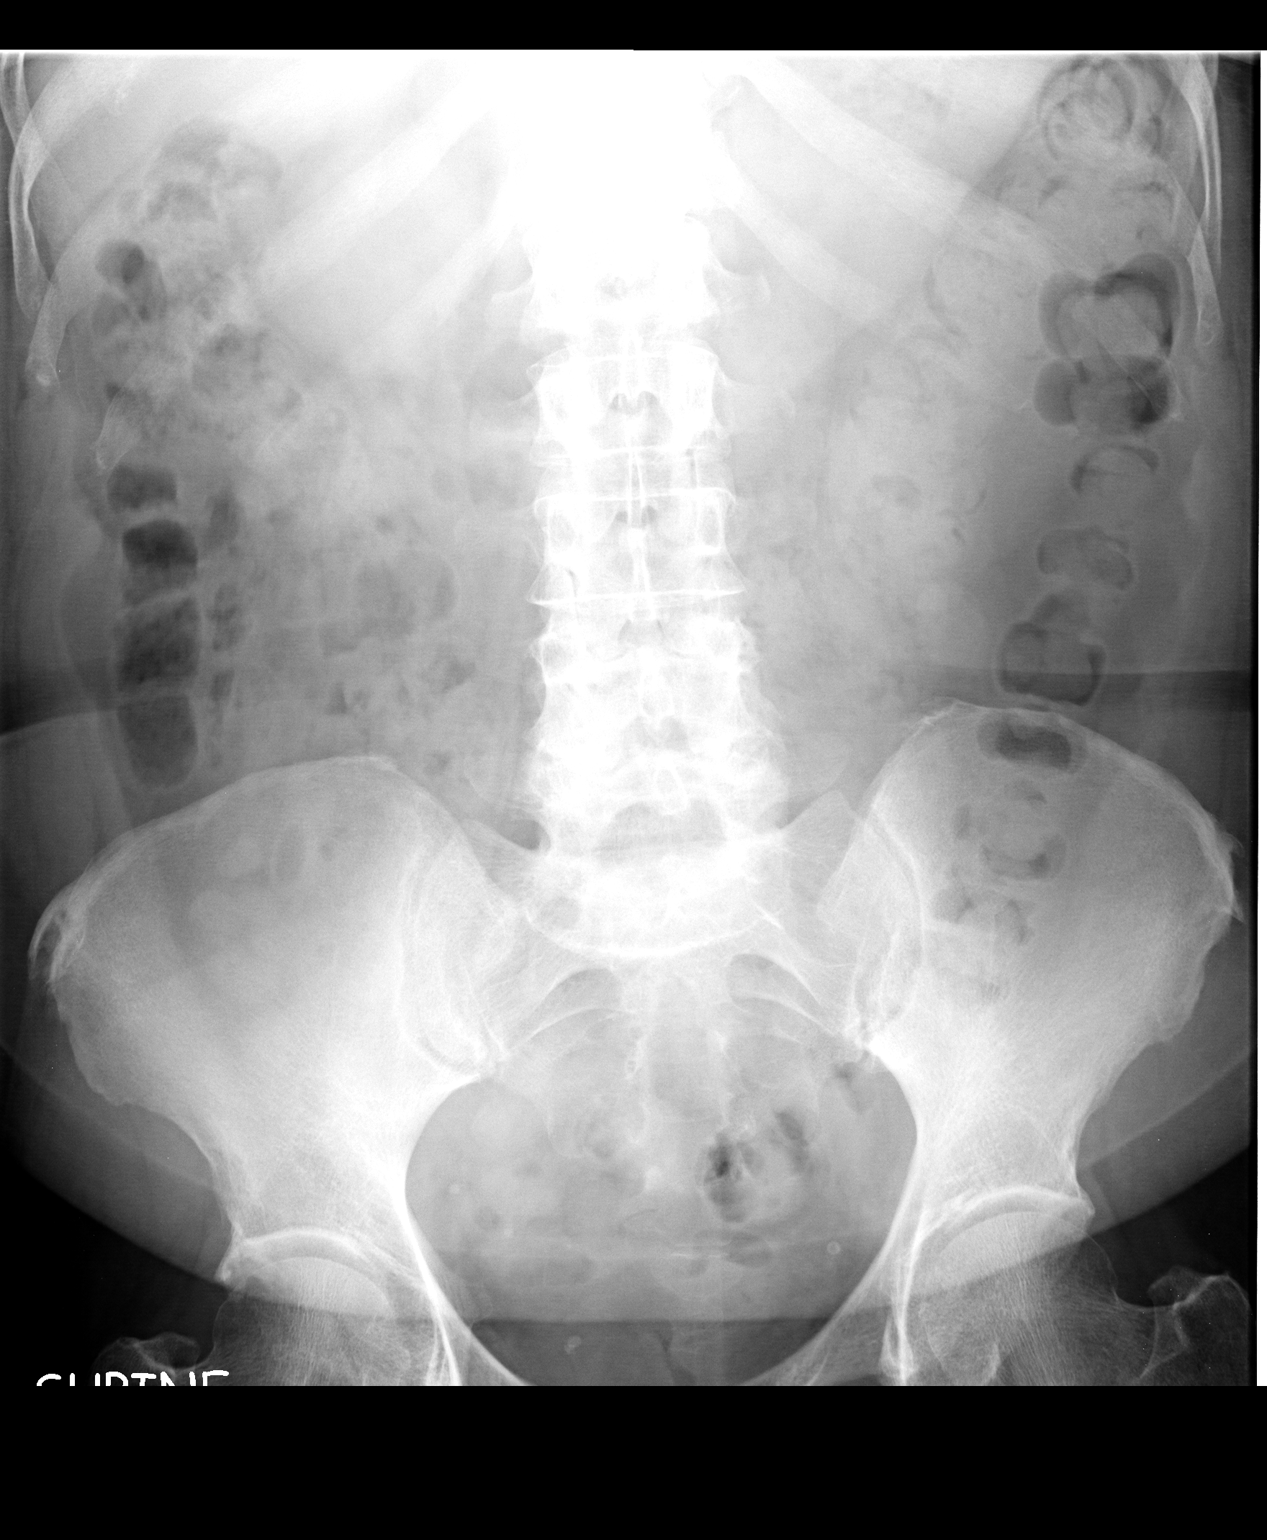

[1 of 1 positions shown; findings below may reference images not displayed]

FINDINGS: Supine view at 7255 hrs. Non obstructed bowel gas pattern. Negative
visible abdominal and pelvic visceral contours. Grossly stable
pelvic phleboliths. No definite urologic calculus identified. No
acute osseous abnormality identified.
IMPRESSION: Negative KUB appearance of the abdomen.

## 2015-09-08 ENCOUNTER — Ambulatory Visit: Payer: Medicare HMO | Admitting: Family Medicine

## 2015-09-26 ENCOUNTER — Other Ambulatory Visit: Payer: Self-pay | Admitting: Family Medicine

## 2015-09-26 NOTE — Telephone Encounter (Signed)
Medication filled to pharmacy as requested.   

## 2015-09-29 NOTE — Telephone Encounter (Signed)
Medication filled to pharmacy as requested.   

## 2015-11-01 ENCOUNTER — Telehealth: Payer: Self-pay | Admitting: Family Medicine

## 2015-11-03 ENCOUNTER — Other Ambulatory Visit: Payer: Self-pay | Admitting: Family Medicine

## 2015-11-03 NOTE — Telephone Encounter (Addendum)
°  Relation to pt: self  Call back number:[phone] Pharmacy: [facility] [address] [place] [address] [phone] (Phone) [phone] (Fax)         Reason for call:  As per patient pharmacy never received Rx patient requesting office contact pharmacy directly. Patient states she is completely out.

## 2015-11-03 NOTE — Telephone Encounter (Signed)
Medication was faxed today. Will refax.

## 2015-11-03 NOTE — Telephone Encounter (Signed)
Last OV: 09/01/2015 w/ Melissa Last filled: 07/21/2015, #30, 2 RF Sig: TAKE ONE TABLET BY MOUTH TWICE DAILY AS NEEDED UDS:  None

## 2015-11-04 ENCOUNTER — Other Ambulatory Visit: Payer: Self-pay

## 2015-11-04 ENCOUNTER — Telehealth: Payer: Self-pay | Admitting: Family Medicine

## 2015-11-04 MED ORDER — AMLODIPINE BESYLATE 10 MG PO TABS: 10.0000 mg | ORAL_TABLET | Freq: Every day | ORAL | Status: DC

## 2015-11-04 MED FILL — clonazePAM 0.5 MG TABS: 0.5 | 15 days supply | Qty: 30 | Fill #0

## 2015-11-04 NOTE — Telephone Encounter (Signed)
Med denied, this was faxed to pharmacy on 11/03/15.

## 2015-11-04 NOTE — Telephone Encounter (Signed)
Amlodipine refilled.

## 2015-11-04 NOTE — Telephone Encounter (Signed)
Caller name:Demaria Relation to pt: self Call back number: [phone] Pharmacy: [facility] [address]  Reason for call: Pt came in office stating threw away her BP medication by mistake and is needing a new rx for Amlodipine Besylate 10 mg. Please advise.

## 2015-12-02 ENCOUNTER — Other Ambulatory Visit: Payer: Self-pay | Admitting: Family Medicine

## 2015-12-02 MED ORDER — AMLODIPINE BESYLATE 10 MG PO TABS: 10.0000 mg | ORAL_TABLET | Freq: Every day | ORAL | Status: DC

## 2015-12-02 MED ORDER — FLUTICASONE PROPIONATE 50 MCG/ACT NA SUSP: NASAL | Status: DC

## 2015-12-02 NOTE — Telephone Encounter (Signed)
Pt says that she is out of her Clonazepam. She says that she keep having issues with getting her medication. Pt is requesting a call back to discuss further.    CB: [phone]

## 2015-12-02 NOTE — Telephone Encounter (Signed)
Last OV 07/21/15 Clonazepam last filled 11/03/15 #30 with 0

## 2015-12-02 NOTE — Telephone Encounter (Signed)
Caller name:Magdelene Relationship to patient self Can be [phone] Pharmacy:[facility] [address]  Reason for call:[facility]ine and klonizipam and lfonase

## 2015-12-03 ENCOUNTER — Other Ambulatory Visit: Payer: Self-pay

## 2015-12-03 MED ORDER — CLONAZEPAM 0.5 MG PO TABS: 0.5000 mg | ORAL_TABLET | Freq: Two times a day (BID) | ORAL | Status: DC | PRN

## 2015-12-03 NOTE — Telephone Encounter (Signed)
Please advise this was filled on 12/03/15 #30 with 0, SIG states take 2 tablets by mouth daily as needed. Should this be filled #60? If you still have Rx I will shred and reprint.

## 2015-12-03 NOTE — Telephone Encounter (Signed)
Please clarify w/ pt- she is supposed to be taking this as needed, NOT every day, twice a day.  If she is using the medication that frequently, we may need to revisit her anxiety treatment.  [place] for #60, 1 refill

## 2015-12-03 NOTE — Telephone Encounter (Signed)
Medication filled to pharmacy as requested.   

## 2015-12-04 NOTE — Telephone Encounter (Signed)
Disregard, pt took the other hard script to the pharmacy. Will leave as written previously.

## 2016-01-06 ENCOUNTER — Ambulatory Visit (INDEPENDENT_AMBULATORY_CARE_PROVIDER_SITE_OTHER): Payer: Medicare HMO | Admitting: Family

## 2016-01-06 ENCOUNTER — Ambulatory Visit: Payer: Medicare HMO | Admitting: Family Medicine

## 2016-01-06 ENCOUNTER — Encounter: Payer: Self-pay | Admitting: Family

## 2016-01-06 VITALS — BP 110/80 | HR 65 | Temp 98.2°F | Resp 16 | Ht 64.0 in | Wt 163.0 lb

## 2016-01-06 DIAGNOSIS — M7022 Olecranon bursitis, left elbow: Secondary | ICD-10-CM | POA: Diagnosis not present

## 2016-01-06 LAB — CBC WITH DIFFERENTIAL/PLATELET
Basophils Absolute: 0 10*3/uL (ref 0.0–0.1)
Basophils Relative: 0.4 % (ref 0.0–3.0)
Eosinophils Absolute: 0.1 10*3/uL (ref 0.0–0.7)
Eosinophils Relative: 1.8 % (ref 0.0–5.0)
HCT: 33.5 % — ABNORMAL LOW (ref 36.0–46.0)
Hemoglobin: 11.1 g/dL — ABNORMAL LOW (ref 12.0–15.0)
Lymphocytes Relative: 25.6 % (ref 12.0–46.0)
Lymphs Abs: 1.9 10*3/uL (ref 0.7–4.0)
MCHC: 33.2 g/dL (ref 30.0–36.0)
MCV: 89 fl (ref 78.0–100.0)
Monocytes Absolute: 0.5 10*3/uL (ref 0.1–1.0)
Monocytes Relative: 6.4 % (ref 3.0–12.0)
Neutro Abs: 4.8 10*3/uL (ref 1.4–7.7)
Neutrophils Relative %: 65.8 % (ref 43.0–77.0)
Platelets: 343 10*3/uL (ref 150.0–400.0)
RBC: 3.76 Mil/uL — ABNORMAL LOW (ref 3.87–5.11)
RDW: 14 % (ref 11.5–15.5)
WBC: 7.4 10*3/uL (ref 4.0–10.5)

## 2016-01-06 LAB — URIC ACID: Uric Acid, Serum: 3.9 mg/dL (ref 2.4–7.0)

## 2016-01-06 MED ORDER — MELOXICAM 7.5 MG PO TABS: 7.5000 mg | ORAL_TABLET | Freq: Every day | ORAL | Status: DC

## 2016-01-06 NOTE — Patient Instructions (Signed)
Please complete lab work prior to leaving. Start meloxicam once daily as needed for pain. Try to keep left elbow wrapped to help with the swelling for the next week or two. Call if you develop increased pain/redness/swelling or if you develop fever. Follow up in 2 weeks.

## 2016-01-06 NOTE — Progress Notes (Signed)
   Subjective:    [name], female    DOB: [birth year], 70 y.o.   MRN: [id number]  HPI  Margaret Torres is a 70 yr old female who presents today with chief complaint of left elbow swelling. Swelling has been present x 3 weeks. Overall pain is improving but she continues to have throbbing.  She denies fever, she denies hx of gout.    Review of Systems See HPI  Past Medical History  Diagnosis Date  . Hypertension   . Anxiety   . Cervical cancer ([place])   . Depression     Social History   Social History  . Marital Status: Divorced    Spouse Name: N/A  . Number of Children: N/A  . Years of Education: N/A   Occupational History  . Not on file.   Social History Main Topics  . Smoking status: Never Smoker   . Smokeless tobacco: Never Used  . Alcohol Use: No  . Drug Use: No  . Sexual Activity: Not Currently    Birth Control/ Protection: Post-menopausal   Other Topics Concern  . Not on file   Social History Narrative    Past Surgical History  Procedure Laterality Date  . Abdominal hysterectomy    . Wisdom tooth extraction Bilateral     Family History  Problem Relation Age of Onset  . Cervical cancer Mother   . Hypertension Mother   . Hypertension Father   . Diabetes Sister   . Stroke Sister   . Cancer Sister     bone cancer  . Heart disease Brother   . Diabetes Brother   . Stroke Maternal Grandmother   . Cancer Other     Allergies  Allergen Reactions  . Influenza Vaccines     Rash, tongue swelling    Current Outpatient Prescriptions on File Prior to Visit  Medication Sig Dispense Refill  . amLODipine (NORVASC) 10 MG tablet Take 1 tablet (10 mg total) by mouth daily. 30 tablet 6  . aspirin EC 81 MG tablet Take 81 mg by mouth 3 (three) times a week.    . clonazePAM (KLONOPIN) 0.5 MG tablet Take 1 tablet (0.5 mg total) by mouth 2 (two) times daily as needed. 30 tablet 3  . fluticasone (FLONASE) 50 MCG/ACT nasal spray USE TWO SPRAY(S) IN EACH  NOSTRIL ONCE DAILY 16 g 1   No current facility-administered medications on file prior to visit.    BP 110/80 mmHg  Pulse 65  Temp(Src) 98.2 F (36.8 C) (Oral)  Resp 16  Ht 5\' 4"  (1.626 m)  Wt 163 lb (73.936 kg)  BMI 27.97 kg/m2  SpO2 97%  LMP 06/30/2013       Objective:   Physical Exam  Constitutional: She is oriented to person, place, and time. She appears well-developed and well-nourished. No distress.  Musculoskeletal: She exhibits no edema.  Swollen left olecranon bursa, no significant erythema.  Neurological: She is alert and oriented to person, place, and time.  Psychiatric: She has a normal mood and affect. Her behavior is normal. Judgment and thought content normal.          Assessment & Plan:  Olecranon Bursitis- obtain CBC and uric acid. rx with meloxicam. We did discuss referral to sports medicine but pt declines due to cost.  Follow up in 2 weeks. I offered to wrap elbow in ACE wrap however she declined and wishes to do herself when she gets home.

## 2016-01-06 NOTE — Progress Notes (Signed)
Pre visit review using our clinic review tool, if applicable. No additional management support is needed unless otherwise documented below in the visit note. 

## 2016-01-30 ENCOUNTER — Ambulatory Visit (INDEPENDENT_AMBULATORY_CARE_PROVIDER_SITE_OTHER): Payer: Medicare HMO | Admitting: Family Medicine

## 2016-01-30 ENCOUNTER — Encounter: Payer: Medicare HMO | Admitting: Family Medicine

## 2016-01-30 ENCOUNTER — Encounter: Payer: Self-pay | Admitting: Family Medicine

## 2016-01-30 VITALS — BP 122/82 | HR 72 | Temp 98.0°F | Resp 16 | Ht 64.0 in | Wt 161.4 lb

## 2016-01-30 DIAGNOSIS — F32A Depression, unspecified: Secondary | ICD-10-CM

## 2016-01-30 DIAGNOSIS — M7022 Olecranon bursitis, left elbow: Secondary | ICD-10-CM | POA: Insufficient documentation

## 2016-01-30 DIAGNOSIS — F329 Major depressive disorder, single episode, unspecified: Secondary | ICD-10-CM

## 2016-01-30 DIAGNOSIS — F418 Other specified anxiety disorders: Secondary | ICD-10-CM | POA: Diagnosis not present

## 2016-01-30 DIAGNOSIS — F419 Anxiety disorder, unspecified: Secondary | ICD-10-CM

## 2016-01-30 HISTORY — DX: Olecranon bursitis, left elbow: M70.22

## 2016-01-30 NOTE — Progress Notes (Signed)
Pre visit review using our clinic review tool, if applicable. No additional management support is needed unless otherwise documented below in the visit note. 

## 2016-01-30 NOTE — Patient Instructions (Signed)
Follow up as needed We'll call you with your orthopedic appt for the elbow swelling Alternate ice and heat for the elbow swelling Continue the Clonazepam for then anxiety and depression Call with any questions or concerns Hang in there!!!

## 2016-01-30 NOTE — Assessment & Plan Note (Signed)
New to provider, ongoing for pt.  She reports the swelling has improved since seeing Debbrah Alar and taking 2 weeks of mobic but she is still unable to lean on L elbow.  Encouraged her to alternate ice and heat to improve swelling.  Will also refer to ortho for evaluation and tx as the area is quite firm and there may be calcification present.  Pt expressed understanding and is in agreement w/ plan.

## 2016-01-30 NOTE — Progress Notes (Signed)
   Subjective:    [name], female    DOB: 1946/05/22, 71 y.o.   MRN: [id number]  HPI L elbow bursitis- pt is unable to lean on elbow due to swelling and pain w/ direct pressure.  Pt 1st noticed 4-5 weeks ago.  Pt reports swelling dramatically improved w/ Mobic.  Pt is using heat.  Anxiety- pt has lost son, her sister and multiple other family members.  Pt reports she is taking Clonazepam daily and 'i know I'm dependent on it'.  Pt reports increased forgetfulness on medication.  Pt reports 'my nerves... I just can't explain.  They're so bad'.  Has been on Clonazepam since 1987.   Review of Systems For ROS see HPI     Objective:   Physical Exam  Constitutional: She is oriented to person, place, and time. She appears well-developed and well-nourished. No distress.  HENT:  Head: Normocephalic and atraumatic.  Cardiovascular: Intact distal pulses.   Musculoskeletal: She exhibits edema (swelling and TTP over L olecranon bursa) and tenderness.  Neurological: She is alert and oriented to person, place, and time.  Skin: Skin is warm and dry.  Psychiatric: She has a normal mood and affect. Her behavior is normal. Thought content normal.  Vitals reviewed.         Assessment & Plan:

## 2016-01-30 NOTE — Assessment & Plan Note (Signed)
Ongoing issue.  Pt continues to struggle w/ the loss of her son- 1 year anniversary tomorrow.  She has been on benzos since at least 1987 and she is not able to wean off of them.  Reviewed safe use of medication and that she should only use a 1/2 tab as she is able.  Pt expressed understanding and is in agreement w/ plan.

## 2016-02-24 ENCOUNTER — Telehealth: Payer: Self-pay | Admitting: Family Medicine

## 2016-02-24 MED ORDER — AMLODIPINE BESYLATE 10 MG PO TABS: 10.0000 mg | ORAL_TABLET | Freq: Every day | ORAL | Status: DC

## 2016-02-24 NOTE — Telephone Encounter (Signed)
Medication filled to pharmacy as requested.   

## 2016-02-24 NOTE — Telephone Encounter (Signed)
Pt states that she needs refill on her bp meds and wants it to go to [facility] [address].

## 2016-03-02 ENCOUNTER — Ambulatory Visit (INDEPENDENT_AMBULATORY_CARE_PROVIDER_SITE_OTHER): Payer: Medicare HMO | Admitting: Family Medicine

## 2016-03-02 ENCOUNTER — Encounter: Payer: Self-pay | Admitting: Family Medicine

## 2016-03-02 VITALS — BP 112/70 | HR 71 | Temp 99.0°F | Resp 16 | Ht 64.0 in | Wt 158.1 lb

## 2016-03-02 DIAGNOSIS — M79605 Pain in left leg: Secondary | ICD-10-CM

## 2016-03-02 DIAGNOSIS — M79604 Pain in right leg: Secondary | ICD-10-CM | POA: Insufficient documentation

## 2016-03-02 DIAGNOSIS — R002 Palpitations: Secondary | ICD-10-CM | POA: Diagnosis not present

## 2016-03-02 DIAGNOSIS — R1310 Dysphagia, unspecified: Secondary | ICD-10-CM | POA: Insufficient documentation

## 2016-03-02 DIAGNOSIS — R5383 Other fatigue: Secondary | ICD-10-CM

## 2016-03-02 HISTORY — DX: Pain in right leg: M79.605

## 2016-03-02 HISTORY — DX: Dysphagia, unspecified: R13.10

## 2016-03-02 HISTORY — DX: Pain in right leg: M79.604

## 2016-03-02 MED ORDER — MELOXICAM 7.5 MG PO TABS: 7.5000 mg | ORAL_TABLET | Freq: Every day | ORAL | Status: DC

## 2016-03-02 NOTE — Progress Notes (Signed)
   Subjective:    [name], female    DOB: 1946-07-08, 70 y.o.   MRN: [id number]  HPI Foot and leg pain- sxs started ~2 weeks ago.  No known injury or change in activity level.  + burning in feet and sharp pains in R ankle.  Having bilateral knee pain and 'they will buckle' while walking.  'everybody in my family end up in a wheelchair'.  'severe pain in both my legs'- from knees down.  Dysphagia- pt reports sxs x2 weeks.  Some indigestion- took Maalox yesterday w/o relief.  No difficulty w/ liquid, only food.  Pt reports she had a lump in her chest yesterday.  Fatigue- sxs started ~2 weeks ago.  Pt reports sleeping well at night w/ her Clonazepam.  No CP, SOB.  + depression- just went to Aunt's funeral and lost nephew 2 months ago.  Intermittent palpitations   Review of Systems For ROS see HPI     Objective:   Physical Exam  Constitutional: She is oriented to person, place, and time. She appears well-developed and well-nourished. No distress.  HENT:  Head: Normocephalic and atraumatic.  Eyes: Conjunctivae and EOM are normal. Pupils are equal, round, and reactive to light.  Neck: Normal range of motion. Neck supple. No thyromegaly present.  Cardiovascular: Normal rate, normal heart sounds and intact distal pulses.   No murmur heard. Occasional PVC- EKG confirms  Pulmonary/Chest: Effort normal and breath sounds normal. No respiratory distress.  Abdominal: Soft. She exhibits no distension. There is no tenderness.  Musculoskeletal: Normal range of motion. She exhibits no edema or tenderness (no TTP over legs, knees, ankles, or feet bilaterally but pt is rubbing them constantly throughout visit).  Lymphadenopathy:    She has no cervical adenopathy.  Neurological: She is alert and oriented to person, place, and time. She has normal reflexes. No cranial nerve deficit.  Skin: Skin is warm and dry.  Psychiatric: Her behavior is normal.  anxious  Vitals reviewed.         Assessment & Plan:

## 2016-03-02 NOTE — Progress Notes (Signed)
Pre visit review using our clinic review tool, if applicable. No additional management support is needed unless otherwise documented below in the visit note. 

## 2016-03-02 NOTE — Patient Instructions (Signed)
Schedule your complete physical at your convenience We'll notify you of your lab results and make any changes if needed Your EKG shows a few extra beats but otherwise looks ok- this is great news! Start the Ranitidine nightly prior to bed to decrease acid production and improve indigestion- if the swallowing doesn't improve, please let me know Restart the Mobic x2 weeks (take w/ food) for the leg pain Make sure you are eating regularly and drinking plenty of fluids to provide energy Call with any questions or concerns Hang in there!!!

## 2016-03-03 ENCOUNTER — Other Ambulatory Visit: Payer: Self-pay | Admitting: Family Medicine

## 2016-03-03 ENCOUNTER — Other Ambulatory Visit (INDEPENDENT_AMBULATORY_CARE_PROVIDER_SITE_OTHER): Payer: Medicare HMO

## 2016-03-03 ENCOUNTER — Telehealth: Payer: Self-pay

## 2016-03-03 DIAGNOSIS — E875 Hyperkalemia: Secondary | ICD-10-CM

## 2016-03-03 DIAGNOSIS — E0590 Thyrotoxicosis, unspecified without thyrotoxic crisis or storm: Secondary | ICD-10-CM

## 2016-03-03 LAB — BASIC METABOLIC PANEL
BUN: 15 mg/dL (ref 6–23)
BUN: 17 mg/dL (ref 6–23)
CO2: 27 mEq/L (ref 19–32)
CO2: 27 mEq/L (ref 19–32)
Calcium: 9.8 mg/dL (ref 8.4–10.5)
Calcium: 9.2 mg/dL (ref 8.4–10.5)
Chloride: 104 mEq/L (ref 96–112)
Chloride: 106 mEq/L (ref 96–112)
Creatinine, Ser: 0.74 mg/dL (ref 0.40–1.20)
Creatinine, Ser: 0.82 mg/dL (ref 0.40–1.20)
GFR: 99.41 mL/min (ref >60.00)
GFR: 88.3 mL/min (ref >60.00)
Glucose, Bld: 65 mg/dL — ABNORMAL LOW (ref 70–99)
Glucose, Bld: 130 mg/dL — ABNORMAL HIGH (ref 70–99)
Potassium: 7.7 mEq/L (ref 3.5–5.1)
Potassium: 3.8 mEq/L (ref 3.5–5.1)
Sodium: 136 mEq/L (ref 135–145)
Sodium: 139 mEq/L (ref 135–145)

## 2016-03-03 LAB — CBC WITH DIFFERENTIAL/PLATELET
Basophils Absolute: 0 10*3/uL (ref 0.0–0.1)
Basophils Relative: 0.5 % (ref 0.0–3.0)
Eosinophils Absolute: 0.1 10*3/uL (ref 0.0–0.7)
Eosinophils Relative: 1.4 % (ref 0.0–5.0)
HCT: 34.1 % — ABNORMAL LOW (ref 36.0–46.0)
Hemoglobin: 11.4 g/dL — ABNORMAL LOW (ref 12.0–15.0)
Lymphocytes Relative: 20.7 % (ref 12.0–46.0)
Lymphs Abs: 1.9 10*3/uL (ref 0.7–4.0)
MCHC: 33.2 g/dL (ref 30.0–36.0)
MCV: 89.4 fl (ref 78.0–100.0)
Monocytes Absolute: 0.6 10*3/uL (ref 0.1–1.0)
Monocytes Relative: 6.7 % (ref 3.0–12.0)
Neutro Abs: 6.6 10*3/uL (ref 1.4–7.7)
Neutrophils Relative %: 70.7 % (ref 43.0–77.0)
Platelets: 304 10*3/uL (ref 150.0–400.0)
RBC: 3.82 Mil/uL — ABNORMAL LOW (ref 3.87–5.11)
RDW: 12.7 % (ref 11.5–15.5)
WBC: 9.4 10*3/uL (ref 4.0–10.5)

## 2016-03-03 LAB — SEDIMENTATION RATE: Sed Rate: 31 mm/hr — ABNORMAL HIGH (ref 0–30)

## 2016-03-03 LAB — HEPATIC FUNCTION PANEL
ALT: 18 U/L (ref 0–35)
AST: 15 U/L (ref 0–37)
Albumin: 4.2 g/dL (ref 3.5–5.2)
Alkaline Phosphatase: 76 U/L (ref 39–117)
Bilirubin, Direct: 0.1 mg/dL (ref 0.0–0.3)
Total Bilirubin: 0.5 mg/dL (ref 0.2–1.2)
Total Protein: 6.7 g/dL (ref 6.0–8.3)

## 2016-03-03 LAB — TSH: TSH: 0.03 u[IU]/mL — ABNORMAL LOW (ref 0.35–4.50)

## 2016-03-03 LAB — B12 AND FOLATE PANEL
Folate: >23.3 ng/mL (ref >5.9)
Vitamin B-12: 427 pg/mL (ref 211–911)

## 2016-03-03 LAB — CK: Total CK: 65 U/L (ref 7–177)

## 2016-03-03 MED ORDER — RANITIDINE HCL 300 MG PO TABS: 300.0000 mg | ORAL_TABLET | Freq: Every day | ORAL | Status: DC

## 2016-03-03 NOTE — Assessment & Plan Note (Signed)
This is a recurrent problem for pt and usually mirrors a worsening in her depression.  Pt reports 2 more deaths in the family recently- 1 just last week.  Check labs to r/o metabolic causes for her fatigue.  Encouraged her to reach out to her church or Hospice for her grief.  Will continue to follow.

## 2016-03-03 NOTE — Progress Notes (Signed)
Quick Note:  Called pt and lmovm to return call. ______ 

## 2016-03-03 NOTE — Assessment & Plan Note (Signed)
New.  Pt reports palpitations in setting of pain.  PVC heard on exam today and EKG confirms.  Pt was asymptomatic today in office even in the setting of PVCs.  Check labs to r/o thyroid issue or anemia.  If she becomes more symptomatic will refer to cardiology for complete evaluation and tx.  Pt expressed understanding and is in agreement w/ plan.

## 2016-03-03 NOTE — Telephone Encounter (Signed)
Called pt and LMOVM to have her call back in regards to labs.

## 2016-03-03 NOTE — Telephone Encounter (Signed)
[facility] called to report critical potassium on pt which was 7.7 they tested twice and got the same reslut. Lab states they dont know if this is a false reading since labs were sent over this morning instead of yesterday. They wanted to know if you want pt to come back in and  Have it repeated or not?

## 2016-03-03 NOTE — Assessment & Plan Note (Signed)
New.  Unclear as to cause.  Pt rubbed her legs the entire visit but she had no TTP or decreased ROM of either leg.  No swelling.  Some difficulty rising from the chair at one point but was able to get on table for EKG w/o difficulty.  Check labs to r/o rheumatologic or inflammatory process.  Restart Mobic short term.  If no improvement in pain, will need to see Rheum or Ortho for complete evaluation.  Pt expressed understanding and is in agreement w/ plan.

## 2016-03-03 NOTE — Telephone Encounter (Signed)
Yes- pt will need to have stat repeat K+ drawn either here or [facility] or the closest location to her home

## 2016-03-03 NOTE — Telephone Encounter (Signed)
Patient notified of PCP recommendations and is agreement and expresses an understanding. Will go to [facility]

## 2016-03-03 NOTE — Assessment & Plan Note (Addendum)
New.  This is occuring in the setting of worsening heartburn/indigestion.  Start ranitidine nightly and monitor for symptom improvement.  If no improvement in her sxs, will need GI referral for complete evaluation and tx.  Pt expressed understanding and is in agreement w/ plan.

## 2016-03-09 ENCOUNTER — Telehealth: Payer: Self-pay | Admitting: Family Medicine

## 2016-03-09 NOTE — Telephone Encounter (Signed)
Patient called our office:  1. After receiving call from [facility] to schedule appt per referral sent.  Patient states she was not aware she needed to see an endocrinologist.  After reviewing chart, I advised pt she was referred to endocrinoolgy due to her TSH level being abnormal.  I contacted endocriology and was given the first available appt for the patient which was 04/07/16 at 2pm.  Pt was also placed on cancellation list in case sooner appt becomes available.  2.  To request if she can cut her amLODipine (NORVASC) 10 MG tablet in half.  She states her blood pressure drops too low when she takes the whole pill.

## 2016-03-10 MED ORDER — AMLODIPINE BESYLATE 5 MG PO TABS: 5.0000 mg | ORAL_TABLET | Freq: Every day | ORAL | Status: DC

## 2016-03-10 NOTE — Telephone Encounter (Signed)
Pt informed and stated an understanding. Med filled to [facility] on [address].

## 2016-03-10 NOTE — Telephone Encounter (Signed)
Ok to decrease Amlodipine to 5mg  daily.  I'm sorry she was confused by the Endocrinology referral but I was present when Margaret Torres, CMA reviewed her labs and told her the next steps- including Endo referral.

## 2016-03-23 ENCOUNTER — Other Ambulatory Visit: Payer: Self-pay

## 2016-03-24 MED ORDER — CLONAZEPAM 0.5 MG PO TABS: 0.5000 mg | ORAL_TABLET | Freq: Two times a day (BID) | ORAL | Status: DC | PRN

## 2016-03-25 ENCOUNTER — Telehealth: Payer: Self-pay | Admitting: General Practice

## 2016-03-25 NOTE — Telephone Encounter (Signed)
Received a fax in regards to patient and was advised her pharmacy never received her clonazepam refill. This was sent to the pharmacy on [address] on 03/24/16, per the fax that was originally received by the office.

## 2016-04-07 ENCOUNTER — Encounter: Payer: Self-pay | Admitting: Endocrinology

## 2016-04-07 ENCOUNTER — Ambulatory Visit (INDEPENDENT_AMBULATORY_CARE_PROVIDER_SITE_OTHER): Payer: Medicare HMO | Admitting: Endocrinology

## 2016-04-07 VITALS — BP 128/70 | HR 90 | Ht 64.5 in | Wt 150.6 lb

## 2016-04-07 DIAGNOSIS — D6489 Other specified anemias: Secondary | ICD-10-CM

## 2016-04-07 DIAGNOSIS — E0590 Thyrotoxicosis, unspecified without thyrotoxic crisis or storm: Secondary | ICD-10-CM | POA: Diagnosis not present

## 2016-04-07 LAB — CBC WITH DIFFERENTIAL/PLATELET
Basophils Absolute: 0 10*3/uL (ref 0.0–0.1)
Basophils Relative: 0.4 % (ref 0.0–3.0)
Eosinophils Absolute: 0.1 10*3/uL (ref 0.0–0.7)
Eosinophils Relative: 1.6 % (ref 0.0–5.0)
HCT: 35.3 % — ABNORMAL LOW (ref 36.0–46.0)
Hemoglobin: 11.5 g/dL — ABNORMAL LOW (ref 12.0–15.0)
Lymphocytes Relative: 26 % (ref 12.0–46.0)
Lymphs Abs: 2.1 10*3/uL (ref 0.7–4.0)
MCHC: 32.7 g/dL (ref 30.0–36.0)
MCV: 87.3 fl (ref 78.0–100.0)
Monocytes Absolute: 0.8 10*3/uL (ref 0.1–1.0)
Monocytes Relative: 10.2 % (ref 3.0–12.0)
Neutro Abs: 5 10*3/uL (ref 1.4–7.7)
Neutrophils Relative %: 61.8 % (ref 43.0–77.0)
Platelets: 297 10*3/uL (ref 150.0–400.0)
RBC: 4.04 Mil/uL (ref 3.87–5.11)
RDW: 12.2 % (ref 11.5–15.5)
WBC: 8 10*3/uL (ref 4.0–10.5)

## 2016-04-07 LAB — IBC PANEL
Iron: 57 ug/dL (ref 42–145)
Saturation Ratios: 17.7 % — ABNORMAL LOW (ref 20.0–50.0)
Transferrin: 230 mg/dL (ref 212.0–360.0)

## 2016-04-07 LAB — T3, FREE: T3, Free: 6.9 pg/mL — ABNORMAL HIGH (ref 2.3–4.2)

## 2016-04-07 LAB — T4, FREE: Free T4: 2.54 ng/dL — ABNORMAL HIGH (ref 0.60–1.60)

## 2016-04-07 MED ORDER — METHIMAZOLE 10 MG PO TABS: 10.0000 mg | ORAL_TABLET | Freq: Two times a day (BID) | ORAL | Status: DC

## 2016-04-07 NOTE — Progress Notes (Signed)
[name], female   DOB: June 16, 1946, 70 y.o.   MRN: [id number]                                                                                                               Reason for Appointment:  Hyperthyroidism, new consultation  Referring physician: Tabori   History of Present Illness:   For the last 2 months at least patient has had complaints about variable appetite, feeling weak and tired as well as some palpitations, getting short of breath and nervousness. Does not think she has much shakiness, no insomnia  Does not complain about any heat intolerance or sweating The patient has lost about 15-17 lbs since these symptoms started  Wt Readings from Last 3 Encounters:  04/07/16 150 lb 9.6 oz (68.312 kg)  03/02/16 158 lb 2 oz (71.725 kg)  01/30/16 161 lb 6 oz (73.199 kg)     When evaluated with thyroid function tests in 5/17 she had a low TSH and was referred here for further evaluation Previously TSH level has being low-normal Free T4 and free T3 are not available and the patient is not on any medications for thyroid  Lab Results  Component Value Date   FREET4 2.54* 04/07/2016   T3FREE 6.9* 04/07/2016   TSH 0.03 Repeated and verified X2.* 03/02/2016   TSH 0.56 01/27/2015   TSH 0.34* 03/27/2014          Medication List       This list is accurate as of: 04/07/16  5:06 PM.  Always use your most recent med list.               amLODipine 5 MG tablet  Commonly known as:  NORVASC  Take 1 tablet (5 mg total) by mouth daily.     aspirin EC 81 MG tablet  Take 81 mg by mouth 3 (three) times a week.     clonazePAM 0.5 MG tablet  Commonly known as:  KLONOPIN  Take 1 tablet (0.5 mg total) by mouth 2 (two) times daily as needed.     fluticasone 50 MCG/ACT nasal spray  Commonly known as:  FLONASE  USE TWO SPRAY(S) IN EACH NOSTRIL ONCE DAILY            Past Medical History  Diagnosis Date  . Hypertension   . Anxiety   . Cervical cancer ([place])    . Depression     Past Surgical History  Procedure Laterality Date  . Abdominal hysterectomy    . Wisdom tooth extraction Bilateral     Family History  Problem Relation Age of Onset  . Cervical cancer Mother   . Hypertension Mother   . Hypertension Father   . Diabetes Sister   . Stroke Sister   . Cancer Sister     bone cancer  . Heart disease Brother   . Diabetes Brother   . Stroke Maternal Grandmother   . Cancer Other   . Thyroid disease Neg Hx     Social History:  reports that she has never smoked. She has never used smokeless tobacco. She reports that she does not drink alcohol or use illicit drugs.  Allergies:  Allergies  Allergen Reactions  . Influenza Vaccines     Rash, tongue swelling    Review of Systems:  Review of Systems  Constitutional: Positive for weight loss and reduced appetite.       3-4 months ago the patient was 167 pounds  HENT: Positive for trouble swallowing.        She feels something in her throat and at times has difficulty swallowing.  Respiratory: Positive for shortness of breath.   Cardiovascular: Positive for palpitations.  Gastrointestinal: Positive for constipation.       She thinks her stools are relatively dark and she tends to have some incontinence along with harder stools, has not discussed with PCP  Endocrine: Positive for fatigue. Negative for heat intolerance and polydipsia.  Genitourinary: Negative for frequency.  Musculoskeletal: Negative for back pain.  Skin: Negative for itching.  Neurological: Positive for weakness.  Psychiatric/Behavioral: Positive for nervousness.      Examination:   BP 128/70 mmHg  Pulse 90  Ht 5' 4.5" (1.638 m)  Wt 150 lb 9.6 oz (68.312 kg)  BMI 25.46 kg/m2  SpO2 98%  LMP 06/30/2013   General Appearance:  well-built and nourished, pleasant, not  Hyperkinetic. somewhat anxious       Eyes: No unusual prominence, lid lag or stare. No swelling of the eyelids   Neck: The thyroid is not  enlarged There is no lymphadenopathy .           Heart: normal S1 and S2, no murmurs .          Lungs: breath sounds are clear bilaterally Abdomen: no hepatosplenomegaly or other palpable abnormality  Extremities: hands are warm. No ankle edema. Neurological: Deep tendon reflexes at biceps are risk. Bilateral fine tremors are present, mild. Skin: No rash, abnormal thickening of the skin on legs or pigmentation seen     Assessment/Plan:   Hyperthyroidism, symptomatic and recently having multiple symptoms including weight loss She does not have a goiter but likely has Graves' disease Her TSH is undetectable Likely to be from Graves' disease   Currently free T4 and free T3 levels are not available and not clear what the degree of hyperthyroidism she has  Patient handout on hyperthyroidism given She will have T4 and T3 levels done today to decide on the dose of antithyroid drugs.  May also consider checking thyrotropin antibody especially if she has relatively high levels and may need long-term therapy with antithyroid drugs  Patient understands the above discussion and treatment options. All questions were answered satisfactorily  ANEMIA with dark stools: She has not discussed with PCP and will check her hemoglobin along with iron level   04/07/2016, 5:06 PM    ADDENDUM: Her free T4 and T3 are very significantly high.  Will start her on 10 mg twice a day of methimazole

## 2016-04-12 ENCOUNTER — Telehealth: Payer: Self-pay | Admitting: Endocrinology

## 2016-04-12 MED ORDER — METHIMAZOLE 10 MG PO TABS: 10.0000 mg | ORAL_TABLET | Freq: Two times a day (BID) | ORAL | Status: DC

## 2016-04-12 NOTE — Telephone Encounter (Signed)
Rx submitted

## 2016-04-12 NOTE — Telephone Encounter (Signed)
PT called and said the thyroid medication that was supposed to be sent in Friday needs to be sent to [facility] on [address]

## 2016-04-12 NOTE — Telephone Encounter (Signed)
[facility] [phone] needs the rx for the methimazole she does not go to the one we called it into

## 2016-04-12 NOTE — Telephone Encounter (Signed)
Rx resubmitted

## 2016-04-21 ENCOUNTER — Encounter: Payer: Self-pay | Admitting: Family Medicine

## 2016-04-21 ENCOUNTER — Ambulatory Visit (INDEPENDENT_AMBULATORY_CARE_PROVIDER_SITE_OTHER): Payer: Medicare HMO | Admitting: Family Medicine

## 2016-04-21 ENCOUNTER — Telehealth: Payer: Self-pay | Admitting: Endocrinology

## 2016-04-21 VITALS — BP 112/78 | HR 82 | Temp 99.0°F | Resp 18 | Ht 65.0 in | Wt 147.4 lb

## 2016-04-21 DIAGNOSIS — E0590 Thyrotoxicosis, unspecified without thyrotoxic crisis or storm: Secondary | ICD-10-CM

## 2016-04-21 DIAGNOSIS — R159 Full incontinence of feces: Secondary | ICD-10-CM

## 2016-04-21 HISTORY — DX: Thyrotoxicosis, unspecified without thyrotoxic crisis or storm: E05.90

## 2016-04-21 LAB — T4, FREE: Free T4: 1.63 ng/dL — ABNORMAL HIGH (ref 0.60–1.60)

## 2016-04-21 LAB — T3, FREE: T3, Free: 4.2 pg/mL (ref 2.3–4.2)

## 2016-04-21 LAB — TSH: TSH: 0.02 u[IU]/mL — ABNORMAL LOW (ref 0.35–4.50)

## 2016-04-21 NOTE — Progress Notes (Signed)
   Subjective:    [name], female    DOB: 1946/01/24, 70 y.o.   MRN: [id number]  HPI Hyperthyroid- pt is now seeing Dr Dwyane Dee.  She is complaining of nervousness, tremor, palpitations, weight loss, nausea.  She feels the methimazole worsens these sxs.  Pt has f/u w/ Dwyane Dee in 2 weeks.    Bowel incontinence- Having bowel incontinence- 'all the time'.  Pt reports some BRBPR- has hx of hemorrhoids.  Has GI doctor- Benson Norway.  Pt having rectal itching, burning.  Using Preparation H gel but no relief.   Review of Systems For ROS see HPI     Objective:   Physical Exam  Constitutional: She is oriented to person, place, and time. She appears well-developed and well-nourished. No distress.  HENT:  Head: Normocephalic and atraumatic.  Eyes: Conjunctivae and EOM are normal. Pupils are equal, round, and reactive to light.  Neck: Normal range of motion. Neck supple. No thyromegaly present.  Cardiovascular: Normal rate, regular rhythm, normal heart sounds and intact distal pulses.   No murmur heard. Pulmonary/Chest: Effort normal and breath sounds normal. No respiratory distress.  Abdominal: Soft. She exhibits no distension. There is no tenderness.  Musculoskeletal: She exhibits no edema.  Lymphadenopathy:    She has no cervical adenopathy.  Neurological: She is alert and oriented to person, place, and time. Coordination normal.  No tremor seen on PE  Skin: Skin is warm and dry.  Psychiatric: Her behavior is normal.  Anxious, difficult to follow thought process  Vitals reviewed.         Assessment & Plan:

## 2016-04-21 NOTE — Patient Instructions (Signed)
We'll notify you of your lab results and make any changes if needed Please call Dr Dwyane Dee about your symptoms b/c these can all be related to your high thyroid levels We will call you with your GI appt about the bowel incontinence and bleeding hemorrhoids Make sure you are taking the Thyroid medication (Methimazole) twice daily to help lower your thyroid levels Your EKG looks ok- no obvious cause for palpitations Call with any questions or concerns Hang in there!!!

## 2016-04-21 NOTE — Telephone Encounter (Signed)
Usually takes a couple of weeks.  If she is concerned about shakiness on palpitations we can give her metoprolol ER 25 mg daily

## 2016-04-21 NOTE — Telephone Encounter (Signed)
Pt is asking when the thyroid med is to start taking effect, she started it on 7/3 please advise

## 2016-04-21 NOTE — Telephone Encounter (Signed)
I contacted the pt and advised of note below. Pt stated she is not feeling shakiness or palpitations. Pt stated she feels sick and has no appetite. Per Dr. Dwyane Dee pt was advised to continue the medicaiton for 1 more week and will let us know how she is doing. Pt voiced understanding.

## 2016-04-21 NOTE — Progress Notes (Signed)
Pre visit review using our clinic review tool, if applicable. No additional management support is needed unless otherwise documented below in the visit note. 

## 2016-04-22 NOTE — Assessment & Plan Note (Signed)
New.  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  Pt has GI- Dr Benson Norway.  Will refer her back for complete evaluation and tx.  Pt expressed understanding and is in agreement w/ plan.

## 2016-04-22 NOTE — Assessment & Plan Note (Signed)
New.  Noted on recent labs.  Pt has since seen Endo and was started on Methimazole.  She feels the medication is worsening her sxs.  She has not yet contacted Dr Dwyane Dee about this.  Provided reassurance that almost all of her sxs can be explained by abnormal thyroid levels and that there are no concerning features on PE today.  Repeat thyroid levels and forward to Endo for review.  Pt to call Dr Dwyane Dee to discuss.  Will follow.

## 2016-04-30 ENCOUNTER — Other Ambulatory Visit: Payer: Medicare HMO

## 2016-05-05 ENCOUNTER — Ambulatory Visit: Payer: Medicare HMO | Admitting: Endocrinology

## 2016-05-10 ENCOUNTER — Other Ambulatory Visit (INDEPENDENT_AMBULATORY_CARE_PROVIDER_SITE_OTHER): Payer: Medicare HMO

## 2016-05-10 DIAGNOSIS — E0590 Thyrotoxicosis, unspecified without thyrotoxic crisis or storm: Secondary | ICD-10-CM

## 2016-05-10 LAB — T4, FREE: Free T4: 0.64 ng/dL (ref 0.60–1.60)

## 2016-05-10 LAB — T3, FREE: T3, Free: 3.2 pg/mL (ref 2.3–4.2)

## 2016-05-10 LAB — ALT: ALT: 20 U/L (ref 0–35)

## 2016-05-13 ENCOUNTER — Ambulatory Visit (INDEPENDENT_AMBULATORY_CARE_PROVIDER_SITE_OTHER): Payer: Medicare HMO | Admitting: Family Medicine

## 2016-05-13 ENCOUNTER — Encounter: Payer: Self-pay | Admitting: Family Medicine

## 2016-05-13 VITALS — BP 122/68 | HR 74 | Temp 98.0°F | Resp 16 | Ht 65.0 in | Wt 150.5 lb

## 2016-05-13 DIAGNOSIS — I10 Essential (primary) hypertension: Secondary | ICD-10-CM | POA: Diagnosis not present

## 2016-05-13 DIAGNOSIS — Z0000 Encounter for general adult medical examination without abnormal findings: Secondary | ICD-10-CM | POA: Diagnosis not present

## 2016-05-13 DIAGNOSIS — E785 Hyperlipidemia, unspecified: Secondary | ICD-10-CM | POA: Diagnosis not present

## 2016-05-13 DIAGNOSIS — E0590 Thyrotoxicosis, unspecified without thyrotoxic crisis or storm: Secondary | ICD-10-CM | POA: Diagnosis not present

## 2016-05-13 LAB — BASIC METABOLIC PANEL
BUN: 10 mg/dL (ref 6–23)
CO2: 27 mEq/L (ref 19–32)
Calcium: 9.5 mg/dL (ref 8.4–10.5)
Chloride: 106 mEq/L (ref 96–112)
Creatinine, Ser: 0.82 mg/dL (ref 0.40–1.20)
GFR: 88.25 mL/min (ref >60.00)
Glucose, Bld: 90 mg/dL (ref 70–99)
Potassium: 5.1 mEq/L (ref 3.5–5.1)
Sodium: 139 mEq/L (ref 135–145)

## 2016-05-13 LAB — CBC WITH DIFFERENTIAL/PLATELET
Basophils Absolute: 0 10*3/uL (ref 0.0–0.1)
Basophils Relative: 0.5 % (ref 0.0–3.0)
Eosinophils Absolute: 0.3 10*3/uL (ref 0.0–0.7)
Eosinophils Relative: 4.2 % (ref 0.0–5.0)
HCT: 34.8 % — ABNORMAL LOW (ref 36.0–46.0)
Hemoglobin: 11.5 g/dL — ABNORMAL LOW (ref 12.0–15.0)
Lymphocytes Relative: 25.2 % (ref 12.0–46.0)
Lymphs Abs: 1.8 10*3/uL (ref 0.7–4.0)
MCHC: 33.1 g/dL (ref 30.0–36.0)
MCV: 85.8 fl (ref 78.0–100.0)
Monocytes Absolute: 0.3 10*3/uL (ref 0.1–1.0)
Monocytes Relative: 4.7 % (ref 3.0–12.0)
Neutro Abs: 4.6 10*3/uL (ref 1.4–7.7)
Neutrophils Relative %: 65.4 % (ref 43.0–77.0)
Platelets: 281 10*3/uL (ref 150.0–400.0)
RBC: 4.05 Mil/uL (ref 3.87–5.11)
RDW: 13.8 % (ref 11.5–15.5)
WBC: 7 10*3/uL (ref 4.0–10.5)

## 2016-05-13 LAB — LIPID PANEL
Cholesterol: 171 mg/dL (ref 0–200)
HDL: 54.1 mg/dL (ref >39.00)
LDL Cholesterol: 100 mg/dL — ABNORMAL HIGH (ref 0–99)
NonHDL: 116.78
Total CHOL/HDL Ratio: 3
Triglycerides: 82 mg/dL (ref 0.0–149.0)
VLDL: 16.4 mg/dL (ref 0.0–40.0)

## 2016-05-13 LAB — HEPATIC FUNCTION PANEL
ALT: 22 U/L (ref 0–35)
AST: 17 U/L (ref 0–37)
Albumin: 4.1 g/dL (ref 3.5–5.2)
Alkaline Phosphatase: 82 U/L (ref 39–117)
Bilirubin, Direct: 0.1 mg/dL (ref 0.0–0.3)
Total Bilirubin: 0.6 mg/dL (ref 0.2–1.2)
Total Protein: 6.8 g/dL (ref 6.0–8.3)

## 2016-05-13 MED ORDER — SERTRALINE HCL 25 MG PO TABS: 25.0000 mg | ORAL_TABLET | Freq: Every day | ORAL | 3 refills | Status: DC

## 2016-05-13 NOTE — Patient Instructions (Signed)
Follow up in 4-6 weeks to recheck mood We'll notify you of your lab results and make any changes if needed Start the Sertraline daily for anxiety/depression You are up to date on colonoscopy until 2023 unless Dr Benson Norway tells you otherwise You are due for mammogram in November Continue to work on healthy diet and regular exercise- you look great! Call with any questions or concerns Hang in there!!!

## 2016-05-13 NOTE — Progress Notes (Signed)
Pre visit review using our clinic review tool, if applicable. No additional management support is needed unless otherwise documented below in the visit note. 

## 2016-05-13 NOTE — Progress Notes (Signed)
   Subjective:    [name], female    DOB: 01/10/1946, 70 y.o.   MRN: [id number]  HPI Here today for CPE.  Risk Factors: HTN- chronic problem, on Amlodipine w/ good control.   Hyperthyroid- relatively new for pt.  On Methimazole.  Has not followed up w/ Dr Dwyane Dee as recommended. Physical Activity: yard work Fall Risk: low risk Depression: chronic problem, taking Clonazepam regularly for anxiety Hearing: normal to conversational tones ADL's: independent Cognitive: tangential thought process but memory and attention intact Home Safety: safe at home Height, Weight, BMI, Visual Acuity: see vitals, vision corrected to 20/20 w/ glasses Counseling: UTD on colonoscopy (due 2023), mammo.  No need for pap due to TAH.  Declines Prevnar today Care team reviewed and updated w/ pt Labs Ordered: See A&P Care Plan: See A&P    Review of Systems Patient reports no vision/ hearing changes, adenopathy,fever, weight change,  persistant/recurrent hoarseness , swallowing issues, chest pain, palpitations, edema, persistant/recurrent cough, hemoptysis, dyspnea (rest/exertional/paroxysmal nocturnal), abdominal pain, significant heartburn, GU symptoms (dysuria, hematuria, incontinence), Gyn symptoms (abnormal  bleeding, pain),  syncope, focal weakness, memory loss, numbness & tingling, skin/hair/nail changes, abnormal bruising or bleeding, anxiety, or depression.   + bowel changes- ongoing diarrhea, + hemorrhoids, has GI appt pending    Objective:   Physical Exam General Appearance:    Alert, cooperative, no distress, appears stated age  Head:    Normocephalic, without obvious abnormality, atraumatic  Eyes:    PERRL, conjunctiva/corneas clear, EOM's intact, fundi    benign, both eyes  Ears:    Normal TM's and external ear canals, both ears  Nose:   Nares normal, septum midline, mucosa normal, no drainage    or sinus tenderness  Throat:   Lips, mucosa, and tongue normal; teeth and gums  normal  Neck:   Supple, symmetrical, trachea midline, no adenopathy;    Thyroid: no enlargement/tenderness/nodules  Back:     Symmetric, no curvature, ROM normal, no CVA tenderness  Lungs:     Clear to auscultation bilaterally, respirations unlabored  Chest Wall:    No tenderness or deformity   Heart:    Regular rate and rhythm, S1 and S2 normal, no murmur, rub   or gallop  Breast Exam:    Deferred to mammo  Abdomen:     Soft, non-tender, bowel sounds active all four quadrants,    no masses, no organomegaly  Genitalia:    Deferred  Rectal:    Extremities:   Extremities normal, atraumatic, no cyanosis or edema  Pulses:   2+ and symmetric all extremities  Skin:   Skin color, texture, turgor normal, no rashes or lesions  Lymph nodes:   Cervical, supraclavicular, and axillary nodes normal  Neurologic:   CNII-XII intact, normal strength, sensation and reflexes    throughout          Assessment & Plan:

## 2016-05-14 ENCOUNTER — Encounter: Payer: Self-pay | Admitting: General Practice

## 2016-05-17 ENCOUNTER — Ambulatory Visit (INDEPENDENT_AMBULATORY_CARE_PROVIDER_SITE_OTHER): Payer: Medicare HMO | Admitting: Endocrinology

## 2016-05-17 ENCOUNTER — Encounter: Payer: Self-pay | Admitting: Endocrinology

## 2016-05-17 VITALS — BP 144/92 | HR 57 | Ht 65.0 in | Wt 153.0 lb

## 2016-05-17 DIAGNOSIS — E0590 Thyrotoxicosis, unspecified without thyrotoxic crisis or storm: Secondary | ICD-10-CM

## 2016-05-17 NOTE — Patient Instructions (Signed)
Take 1 pill methimazole in am only

## 2016-05-17 NOTE — Progress Notes (Signed)
[name], female   DOB: [birth year], 70 y.o.   MRN: [id number]                                                                                                               Reason for Appointment:  Hyperthyroidism, follow-up   Referring physician: Birdie Riddle   History of Present Illness:   Prior to her initial consultation in 6/17 she had complaints about variable appetite, feeling weak and tired; also had some palpitations, getting short of breath, about 15 pound weight loss and nervousness.  She was started on methimazole 10 mg twice a day since her free T4 and free T3 levels were significantly high She however did not come back for follow-up in 3 weeks as directed as she went out of town  She is feeling much better more recently and has gained back most of the lost weight as well as has no decrease in appetite, weakness, fatigue or dyspnea She is complaining of methimazole causing insomnia at night and increasing her appetite  Wt Readings from Last 3 Encounters:  05/17/16 153 lb (69.4 kg)  05/13/16 150 lb 8 oz (68.3 kg)  04/21/16 147 lb 6 oz (66.8 kg)      Free T4 and free T3 are significantly better and free T4 is not below normal TSH still low as expected   Lab Results  Component Value Date   FREET4 0.64 05/10/2016   FREET4 1.63 (H) 04/21/2016   FREET4 2.54 (H) 04/07/2016   T3FREE 3.2 05/10/2016   T3FREE 4.2 04/21/2016   T3FREE 6.9 (H) 04/07/2016   TSH 0.02 (L) 04/21/2016   TSH 0.03 Repeated and verified X2. (L) 03/02/2016   TSH 0.56 01/27/2015          Medication List       Accurate as of 05/17/16  4:51 PM. Always use your most recent med list.          amLODipine 5 MG tablet Commonly known as:  NORVASC Take 1 tablet (5 mg total) by mouth daily.   aspirin EC 81 MG tablet Take 81 mg by mouth 3 (three) times a week.   clonazePAM 0.5 MG tablet Commonly known as:  KLONOPIN Take 1 tablet (0.5 mg total) by mouth 2 (two) times daily as  needed.   fluticasone 50 MCG/ACT nasal spray Commonly known as:  FLONASE USE TWO SPRAY(S) IN EACH NOSTRIL ONCE DAILY   methimazole 10 MG tablet Commonly known as:  TAPAZOLE Take 1 tablet (10 mg total) by mouth 2 (two) times daily.   sertraline 25 MG tablet Commonly known as:  ZOLOFT Take 1 tablet (25 mg total) by mouth daily.           Past Medical History:  Diagnosis Date  . Anxiety   . Cervical cancer ([place])   . Depression   . Hypertension     Past Surgical History:  Procedure Laterality Date  . ABDOMINAL HYSTERECTOMY    . WISDOM TOOTH EXTRACTION Bilateral     Family  History  Problem Relation Age of Onset  . Cervical cancer Mother   . Hypertension Mother   . Hypertension Father   . Heart disease Brother   . Diabetes Brother   . Stroke Maternal Grandmother   . Cancer Other   . Diabetes Sister   . Stroke Sister   . Cancer Sister     bone cancer  . Thyroid disease Neg Hx     Social History:  reports that she has never smoked. She has never used smokeless tobacco. She reports that she does not drink alcohol or use drugs.  Allergies:  Allergies  Allergen Reactions  . Influenza Vaccines     Rash, tongue swelling     Review of Systems  Psychiatric/Behavioral: Positive for insomnia.   Blood pressure is relatively high, she has not taken her medication today   Examination:   BP (!) 144/92 Comment: Without bp med  Pulse (!) 57   Ht 5\' 5"  (1.651 m)   Wt 153 lb (69.4 kg)   LMP 06/30/2013   SpO2 98%   BMI 25.46 kg/m   She looks well, not anxious  Neck: The thyroid is not enlarged    Neurological: Deep tendon reflexes at biceps are Showing slight slowness of relaxation .    Assessment/Plan:   Hyperthyroidism, likely to be from Graves' disease  With starting methimazole 10 mg twice a day her hypothyroidism is much better controlled She is symptomatically better and has regained a lot of the weight she has lost She thinks methimazole causes  increased appetite and insomnia  However she did delay her follow-up and now her free T4 level is low normal without symptoms of hypothyroidism  For now we can cut her methimazole to just one tablet in the morning daily Encouraged her to watch her diet and be active to prevent any further weight gain Follow-up in 4 weeks and also check thyrotropin receptor antibody  05/17/2016, 4:51 PM

## 2016-05-18 NOTE — Assessment & Plan Note (Signed)
Chronic problem.  Not currently on medication.  Attempting to control w/ healthy diet and regular exercise.  Check labs.  Start meds prn.

## 2016-05-18 NOTE — Assessment & Plan Note (Signed)
Chronic problem.  Well controlled.  Asymptomatic.  No med changes at this time.

## 2016-05-18 NOTE — Assessment & Plan Note (Signed)
Ongoing issue for pt.  She has not followed up w/ Dr Dwyane Dee as recommended.  Stressed that she must do this so they can adjust her medication appropriately.  Will follow along.

## 2016-05-18 NOTE — Assessment & Plan Note (Signed)
Pt's PE unchanged from previous.  UTD on colonoscopy, mammo.  Declines Prevnar.  Written screening schedule updated and given to pt.  Check labs.  Anticipatory guidance provided.

## 2016-06-09 ENCOUNTER — Other Ambulatory Visit: Payer: Self-pay | Admitting: Family Medicine

## 2016-06-17 ENCOUNTER — Telehealth: Payer: Self-pay | Admitting: Family Medicine

## 2016-06-17 NOTE — Telephone Encounter (Signed)
FYI  Pt was last seen on 05/13/16 zoloft was sent in for patient on 05/13/16 #30 with 3

## 2016-06-17 NOTE — Telephone Encounter (Signed)
Pt states that she is unable to take zoloft, pt states that she took one pill and it made her sick. Pt canceled appt for Monday.

## 2016-06-21 ENCOUNTER — Ambulatory Visit: Payer: Medicare HMO | Admitting: Family Medicine

## 2016-06-28 ENCOUNTER — Other Ambulatory Visit: Payer: Self-pay | Admitting: Family Medicine

## 2016-06-28 NOTE — Telephone Encounter (Signed)
Medication filled to pharmacy as requested.   

## 2016-06-28 NOTE — Telephone Encounter (Signed)
Last OV 05/13/16 Clonazepam last filled 03/24/16 #30 with 3

## 2016-06-29 ENCOUNTER — Other Ambulatory Visit (INDEPENDENT_AMBULATORY_CARE_PROVIDER_SITE_OTHER): Payer: Medicare HMO

## 2016-06-29 DIAGNOSIS — E0590 Thyrotoxicosis, unspecified without thyrotoxic crisis or storm: Secondary | ICD-10-CM

## 2016-06-29 LAB — T4, FREE: Free T4: 0.67 ng/dL (ref 0.60–1.60)

## 2016-06-29 LAB — TSH: TSH: 0.17 u[IU]/mL — ABNORMAL LOW (ref 0.35–4.50)

## 2016-06-30 LAB — THYROTROPIN RECEPTOR AUTOABS: Thyrotropin Receptor Ab: 4.7 IU/L — ABNORMAL HIGH (ref 0.00–1.75)

## 2016-07-01 ENCOUNTER — Encounter: Payer: Self-pay | Admitting: Endocrinology

## 2016-07-01 ENCOUNTER — Ambulatory Visit (INDEPENDENT_AMBULATORY_CARE_PROVIDER_SITE_OTHER): Payer: Medicare HMO | Admitting: Endocrinology

## 2016-07-01 VITALS — BP 130/82 | HR 73 | Temp 97.9°F | Resp 14 | Ht 65.0 in | Wt 157.4 lb

## 2016-07-01 DIAGNOSIS — E0590 Thyrotoxicosis, unspecified without thyrotoxic crisis or storm: Secondary | ICD-10-CM | POA: Diagnosis not present

## 2016-07-01 MED ORDER — METHIMAZOLE 5 MG PO TABS: 5.0000 mg | ORAL_TABLET | Freq: Every day | ORAL | 2 refills | Status: DC

## 2016-07-01 NOTE — Progress Notes (Signed)
[name], female   DOB: Feb 27, 1946, 70 y.o.   MRN: [id number]                                                                                                               Reason for Appointment:  Hyperthyroidism, follow-up   Referring physician: Birdie Riddle   History of Present Illness:   Prior to her initial consultation in 6/17 she had complaints about variable appetite, feeling weak and tired; also had some palpitations, getting short of breath, about 15 pound weight loss and nervousness.  She was started on methimazole 10 mg twice a day since her free T4 and free T3 levels were significantly high  She has been irregular with her follow-up and not coming back as directed In 8/17 with her being on 10 mg twice a day her free T4 was low normal and her methimazole was reduced to 10 mg daily; at that time  she was feeling fairly good  Soon after her last visit she says she started getting a feeling of heaviness in her chest and head when she would take her methimazole and this would last until the next morning. She stopped taking her methimazole for about 2 weeks but then she started having palpitations and nervousness She is back on methimazole now but having the same symptoms  She is concerned about her weight gain   Wt Readings from Last 3 Encounters:  07/01/16 157 lb 6.4 oz (71.4 kg)  05/17/16 153 lb (69.4 kg)  05/13/16 150 lb 8 oz (68.3 kg)      Free T4 Is still low normal with suppressed TSH    Lab Results  Component Value Date   FREET4 0.67 06/29/2016   FREET4 0.64 05/10/2016   FREET4 1.63 (H) 04/21/2016   T3FREE 3.2 05/10/2016   T3FREE 4.2 04/21/2016   T3FREE 6.9 (H) 04/07/2016   TSH 0.17 (L) 06/29/2016   TSH 0.02 (L) 04/21/2016   TSH 0.03 Repeated and verified X2. (L) 03/02/2016          Medication List       Accurate as of 07/01/16  4:21 PM. Always use your most recent med list.          amLODipine 10 MG tablet Commonly known as:   NORVASC   aspirin EC 81 MG tablet Take 81 mg by mouth 3 (three) times a week.   clonazePAM 0.5 MG tablet Commonly known as:  KLONOPIN TAKE ONE TABLET BY MOUTH TWICE DAILY AS NEEDED   fluticasone 50 MCG/ACT nasal spray Commonly known as:  FLONASE USE TWO SPRAY(S) IN EACH NOSTRIL ONCE DAILY   methimazole 10 MG tablet Commonly known as:  TAPAZOLE Take 1 tablet (10 mg total) by mouth 2 (two) times daily.           Past Medical History:  Diagnosis Date  . Anxiety   . Cervical cancer ([place])   . Depression   . Hypertension     Past Surgical History:  Procedure Laterality Date  .  ABDOMINAL HYSTERECTOMY    . WISDOM TOOTH EXTRACTION Bilateral     Family History  Problem Relation Age of Onset  . Cervical cancer Mother   . Hypertension Mother   . Hypertension Father   . Heart disease Brother   . Diabetes Brother   . Stroke Maternal Grandmother   . Cancer Other   . Diabetes Sister   . Stroke Sister   . Cancer Sister     bone cancer  . Thyroid disease Neg Hx     Social History:  reports that she has never smoked. She has never used smokeless tobacco. She reports that she does not drink alcohol or use drugs.  Allergies:  Allergies  Allergen Reactions  . Influenza Vaccines     Rash, tongue swelling     Review of Systems  Psychiatric/Behavioral: Positive for insomnia.   Blood pressure is relatively high, she has not taken her medication today   Examination:   BP 130/82   Pulse 73   Temp 97.9 F (36.6 C)   Resp 14   Ht 5\' 5"  (1.651 m)   Wt 157 lb 6.4 oz (71.4 kg)   LMP 06/30/2013   SpO2 96%   BMI 26.19 kg/m   She looks well   Neck: The thyroid is not enlarged    Neurological: Deep tendon reflexes at biceps are Difficult to elicit, appear normal .    Assessment/Plan:   Hyperthyroidism, likely to be from Graves' disease  With  methimazole 10 mg once a day her free T4 level is again low normal However her thyrotropin receptor antibody is still  relatively high  She thinks that methimazole causes chest heaviness and headaches and has not taken it regularly also  She can reduce her methimazole now to 5 mg  Follow-up in 4 weeks again  07/01/2016, 4:21 PM

## 2016-07-01 NOTE — Patient Instructions (Signed)
Reduce dose to 5mg , at lunch

## 2016-07-28 ENCOUNTER — Other Ambulatory Visit (INDEPENDENT_AMBULATORY_CARE_PROVIDER_SITE_OTHER): Payer: Medicare HMO

## 2016-07-28 ENCOUNTER — Other Ambulatory Visit: Payer: Medicare HMO

## 2016-07-28 DIAGNOSIS — E0590 Thyrotoxicosis, unspecified without thyrotoxic crisis or storm: Secondary | ICD-10-CM

## 2016-07-28 LAB — TSH: TSH: 3.16 u[IU]/mL (ref 0.35–4.50)

## 2016-07-28 LAB — T4, FREE: Free T4: 0.49 ng/dL — ABNORMAL LOW (ref 0.60–1.60)

## 2016-07-28 LAB — T3, FREE: T3, Free: 2.8 pg/mL (ref 2.3–4.2)

## 2016-08-02 ENCOUNTER — Encounter: Payer: Self-pay | Admitting: Endocrinology

## 2016-08-02 ENCOUNTER — Ambulatory Visit (INDEPENDENT_AMBULATORY_CARE_PROVIDER_SITE_OTHER): Payer: Medicare HMO | Admitting: Endocrinology

## 2016-08-02 VITALS — BP 120/76 | HR 63 | Temp 98.2°F | Resp 14 | Ht 65.0 in | Wt 160.8 lb

## 2016-08-02 DIAGNOSIS — E0590 Thyrotoxicosis, unspecified without thyrotoxic crisis or storm: Secondary | ICD-10-CM

## 2016-08-02 DIAGNOSIS — R1310 Dysphagia, unspecified: Secondary | ICD-10-CM | POA: Diagnosis not present

## 2016-08-02 DIAGNOSIS — R1319 Other dysphagia: Secondary | ICD-10-CM

## 2016-08-02 NOTE — Progress Notes (Signed)
[name], female   DOB: 1946-07-03, 70 y.o.   MRN: [id number]                                                                                                               Reason for Appointment:  Hyperthyroidism, follow-up   Referring physician: Birdie Riddle   History of Present Illness:   Prior to her initial consultation in 6/17 she had complaints about variable appetite, feeling weak and tired; also had some palpitations, getting short of breath, about 15 pound weight loss and nervousness.  She was started on methimazole 10 mg twice a day since her free T4 and free T3 levels were significantly high  She was initially regular with her follow-up In 8/17 with her being on 10 mg twice a day her free T4 was low normal and her methimazole was reduced to 10 mg daily; at that time  she was feeling fairly good  More recently her dose was reduced to 5 mg daily when her free T4 was low normal in 9/17. She appears to be adjusting her medication on her own because of the day she feels and either she stops her methimazole or increases her dose, sometimes inappropriately  Currently is not having any significant fatigue but having some palpitations and she increased her dose to 2 tablets daily about 7-10 days ago. Also this time she says that she has been feeling something in her neck in the front that is affecting her swallowing and for this reason again increased her methimazole  No consistent heat or cold intolerance Again is getting weight    Wt Readings from Last 3 Encounters:  08/02/16 160 lb 12.8 oz (72.9 kg)  07/01/16 157 lb 6.4 oz (71.4 kg)  05/17/16 153 lb (69.4 kg)     Free T4 Is Further below normal but TSH is now mid normal    Lab Results  Component Value Date   FREET4 0.49 (L) 07/28/2016   FREET4 0.67 06/29/2016   FREET4 0.64 05/10/2016   T3FREE 2.8 07/28/2016   T3FREE 3.2 05/10/2016   T3FREE 4.2 04/21/2016   TSH 3.16 07/28/2016   TSH 0.17 (L) 06/29/2016     TSH 0.02 (L) 04/21/2016          Medication List       Accurate as of 08/02/16  2:41 PM. Always use your most recent med list.          amLODipine 10 MG tablet Commonly known as:  NORVASC   aspirin EC 81 MG tablet Take 81 mg by mouth 3 (three) times a week.   clonazePAM 0.5 MG tablet Commonly known as:  KLONOPIN TAKE ONE TABLET BY MOUTH TWICE DAILY AS NEEDED   fluticasone 50 MCG/ACT nasal spray Commonly known as:  FLONASE USE TWO SPRAY(S) IN EACH NOSTRIL ONCE DAILY   methimazole 10 MG tablet Commonly known as:  TAPAZOLE Patient said she takes 5 in the am and 10 in the pm   methimazole 5 MG tablet Commonly known as:  TAPAZOLE Take 1 tablet (5 mg total) by mouth daily.           Past Medical History:  Diagnosis Date  . Anxiety   . Cervical cancer ([place])   . Depression   . Hypertension     Past Surgical History:  Procedure Laterality Date  . ABDOMINAL HYSTERECTOMY    . WISDOM TOOTH EXTRACTION Bilateral     Family History  Problem Relation Age of Onset  . Cervical cancer Mother   . Hypertension Mother   . Hypertension Father   . Heart disease Brother   . Diabetes Brother   . Stroke Maternal Grandmother   . Cancer Other   . Diabetes Sister   . Stroke Sister   . Cancer Sister     bone cancer  . Thyroid disease Neg Hx     Social History:  reports that she has never smoked. She has never used smokeless tobacco. She reports that she does not drink alcohol or use drugs.  Allergies:  Allergies  Allergen Reactions  . Influenza Vaccines     Rash, tongue swelling     Review of Systems  Psychiatric/Behavioral: Positive for insomnia.       Examination:   BP 120/76   Pulse 63   Temp 98.2 F (36.8 C)   Resp 14   Ht 5\' 5"  (1.651 m)   Wt 160 lb 12.8 oz (72.9 kg)   LMP 06/30/2013   SpO2 96%   BMI 26.76 kg/m   She looks well  No puffiness of the eyes or phase  Neck: The thyroid is not enlarged, Indistinct isthmus swelling felt on  swallowing    Neurological: Deep tendon reflexes at biceps are appearing normal Skin normal No peripheral edema .    Assessment/Plan:   Hyperthyroidism, from Graves' disease  With  methimazole 5 mg daily her free T4 level continues to be trending lower Also patient has inappropriately increased her dose to twice a day when she was feeling a lump in her neck Despite her pulse rate being relatively slow and not irregular she thinks she is getting palpitations  Discussed with the patient that she is hypothyroid now with some normal free T4 and TSH is trending significantly higher  She can reduce her methimazole now to 5 mg every other day Thyroid ultrasound.  If she has a nodule may need thyroid scan but otherwise with have her go back to her PCP for further evaluation of dysphagia  Follow-up in 4 weeks again  08/02/2016, 2:41 PM

## 2016-08-02 NOTE — Patient Instructions (Signed)
Take methimalzole 5mg  every 2 days

## 2016-08-10 ENCOUNTER — Ambulatory Visit
Admission: RE | Admit: 2016-08-10 | Discharge: 2016-08-10 | Disposition: A | Discharge status: Home / Self Care | Payer: Medicare HMO | Source: Ambulatory Visit | Attending: Endocrinology | Admitting: Endocrinology

## 2016-08-10 DIAGNOSIS — R1310 Dysphagia, unspecified: Secondary | ICD-10-CM

## 2016-08-10 DIAGNOSIS — E0590 Thyrotoxicosis, unspecified without thyrotoxic crisis or storm: Secondary | ICD-10-CM

## 2016-08-10 DIAGNOSIS — R1319 Other dysphagia: Secondary | ICD-10-CM

## 2016-08-10 NOTE — Progress Notes (Signed)
Please let patient know that the ultrasound shows a nodule in each side about 1-1-1/2 inch and will need to see if these are overactive with nuclear Thyroid scan, will order this if she agrees.  May be able to treat these with radioactive iodine

## 2016-08-12 ENCOUNTER — Telehealth: Payer: Self-pay | Admitting: Endocrinology

## 2016-08-12 NOTE — Telephone Encounter (Signed)
Pt returning call regarding results, read the note to her but she has many additional questions.

## 2016-08-17 NOTE — Telephone Encounter (Signed)
I contacted the pateint and left a voicemail requesting a call back to discuss further.

## 2016-08-26 ENCOUNTER — Other Ambulatory Visit (INDEPENDENT_AMBULATORY_CARE_PROVIDER_SITE_OTHER): Payer: Medicare HMO

## 2016-08-26 DIAGNOSIS — E0590 Thyrotoxicosis, unspecified without thyrotoxic crisis or storm: Secondary | ICD-10-CM

## 2016-08-26 LAB — TSH: TSH: 1.28 u[IU]/mL (ref 0.35–4.50)

## 2016-08-26 LAB — T4, FREE: Free T4: 0.72 ng/dL (ref 0.60–1.60)

## 2016-08-27 LAB — THYROTROPIN RECEPTOR AUTOABS: Thyrotropin Receptor Ab: 2.45 IU/L — ABNORMAL HIGH (ref 0.00–1.75)

## 2016-08-30 ENCOUNTER — Ambulatory Visit (INDEPENDENT_AMBULATORY_CARE_PROVIDER_SITE_OTHER): Payer: Medicare HMO | Admitting: Endocrinology

## 2016-08-30 VITALS — BP 120/88 | HR 88 | Wt 160.0 lb

## 2016-08-30 DIAGNOSIS — E0590 Thyrotoxicosis, unspecified without thyrotoxic crisis or storm: Secondary | ICD-10-CM

## 2016-08-30 NOTE — Progress Notes (Signed)
[name], female   DOB: [birth year], 70 y.o.   MRN: [id number]                                                                                                               Reason for Appointment:  Hyperthyroidism, follow-up   Referring physician: Birdie Riddle   History of Present Illness:   Prior to her initial consultation in 6/17 she had complaints about variable appetite, feeling weak and tired; also had some palpitations, getting short of breath, about 15 pound weight loss and nervousness.  She was started on methimazole 10 mg twice a day since her free T4 and free T3 levels were significantly high  In 8/17 with her being on 10 mg twice a day her free T4 was low normal and her methimazole was reduced to 10 mg daily; at that time  she was feeling fairly good More recently her dose was reduced to 5 mg every other day when her free T4 was low in 10/70  Currently is not having any significant fatigue and he did not have any symptoms even when her free T4 was low She still has very occasional palpitations at night but her pulse is fairly good today Weight is stable  Because of her complaining of feeling something in her upper neck and thyroid ultrasound was done This showed dominant nodules in each lobe, largest 3.8 cm   Wt Readings from Last 3 Encounters:  08/30/16 160 lb (72.6 kg)  08/02/16 160 lb 12.8 oz (72.9 kg)  07/01/16 157 lb 6.4 oz (71.4 kg)     Free T4 Is Now back to low normal and TSH is normal also THYROTROPIN receptor antibody is mildly increased   Lab Results  Component Value Date   FREET4 0.72 08/26/2016   FREET4 0.49 (L) 07/28/2016   FREET4 0.67 06/29/2016   T3FREE 2.8 07/28/2016   T3FREE 3.2 05/10/2016   T3FREE 4.2 04/21/2016   TSH 1.28 08/26/2016   TSH 3.16 07/28/2016   TSH 0.17 (L) 06/29/2016          Medication List       Accurate as of 08/30/16  3:40 PM. Always use your most recent med list.          amLODipine 10 MG  tablet Commonly known as:  NORVASC   aspirin EC 81 MG tablet Take 81 mg by mouth 3 (three) times a week.   clonazePAM 0.5 MG tablet Commonly known as:  KLONOPIN TAKE ONE TABLET BY MOUTH TWICE DAILY AS NEEDED   fluticasone 50 MCG/ACT nasal spray Commonly known as:  FLONASE USE TWO SPRAY(S) IN EACH NOSTRIL ONCE DAILY   methimazole 5 MG tablet Commonly known as:  TAPAZOLE Take 1 tablet (5 mg total) by mouth daily.           Past Medical History:  Diagnosis Date  . Anxiety   . Cervical cancer ([place])   . Depression   . Hypertension     Past Surgical History:  Procedure Laterality Date  .  ABDOMINAL HYSTERECTOMY    . WISDOM TOOTH EXTRACTION Bilateral     Family History  Problem Relation Age of Onset  . Cervical cancer Mother   . Hypertension Mother   . Hypertension Father   . Heart disease Brother   . Diabetes Brother   . Stroke Maternal Grandmother   . Cancer Other   . Diabetes Sister   . Stroke Sister   . Cancer Sister     bone cancer  . Thyroid disease Neg Hx     Social History:  reports that she has never smoked. She has never used smokeless tobacco. She reports that she does not drink alcohol or use drugs.  Allergies:  Allergies  Allergen Reactions  . Influenza Vaccines     Rash, tongue swelling     Review of Systems  Psychiatric/Behavioral: Positive for insomnia.       Examination:   BP 120/88   Pulse 88   Wt 160 lb (72.6 kg)   LMP 06/30/2013   SpO2 96%   BMI 26.63 kg/m   She looks well   Neck: The thyroid is Just palpable on swallowing in the right and mildly in the isthmus on swallowing    Neurological: Deep tendon reflexes at biceps are  normal Skin normal No peripheral edema .    Assessment/Plan:   Hyperthyroidism, from Graves' disease She also has a multinodular goiter  With  methimazole 5 mg every other day her free T4 and TSH are not quite normal Although most likely she may be able to taper off her methimazole Will hold  off for now since her thyrotropin antibody is still positive  Multinodular goiter: Does not appear to be symptomatic now Discussed that we need to do a nuclear thyroid scan to evaluate which of the nodules is hot or cold However she is refusing to do this now and she thinks she wants to ask other people about this first Also may consider needle aspiration if either of the 2  nodules are cold  Follow-up in 4 weeks again  08/30/2016, 3:40 PM

## 2016-08-30 NOTE — Patient Instructions (Signed)
Stay on same dose

## 2016-09-01 ENCOUNTER — Other Ambulatory Visit: Payer: Self-pay | Admitting: Family Medicine

## 2016-09-06 ENCOUNTER — Other Ambulatory Visit: Payer: Self-pay | Admitting: Family Medicine

## 2016-09-06 NOTE — Telephone Encounter (Signed)
Medication filled to pharmacy as requested.   

## 2016-09-06 NOTE — Telephone Encounter (Signed)
Pt states that Pharmacy did not receive a request to refill clonazePAM  and asking [name] to resend it.

## 2016-09-06 NOTE — Telephone Encounter (Signed)
Last OV 05/13/16 Clonazepam last filled 06/28/16 #30 with 1

## 2016-09-06 NOTE — Telephone Encounter (Signed)
Med was just filled to pharmacy.

## 2016-09-20 ENCOUNTER — Encounter: Payer: Self-pay | Admitting: Family Medicine

## 2016-09-20 ENCOUNTER — Ambulatory Visit (INDEPENDENT_AMBULATORY_CARE_PROVIDER_SITE_OTHER): Payer: Medicare HMO | Admitting: Family Medicine

## 2016-09-20 VITALS — BP 120/80 | HR 63 | Temp 98.0°F | Resp 16 | Ht 65.0 in | Wt 159.0 lb

## 2016-09-20 DIAGNOSIS — R42 Dizziness and giddiness: Secondary | ICD-10-CM

## 2016-09-20 DIAGNOSIS — R197 Diarrhea, unspecified: Secondary | ICD-10-CM | POA: Insufficient documentation

## 2016-09-20 DIAGNOSIS — R609 Edema, unspecified: Secondary | ICD-10-CM | POA: Diagnosis not present

## 2016-09-20 DIAGNOSIS — R140 Abdominal distension (gaseous): Secondary | ICD-10-CM | POA: Diagnosis not present

## 2016-09-20 LAB — BASIC METABOLIC PANEL
BUN: 19 mg/dL (ref 6–23)
CO2: 26 mEq/L (ref 19–32)
Calcium: 9.3 mg/dL (ref 8.4–10.5)
Chloride: 106 mEq/L (ref 96–112)
Creatinine, Ser: 1.37 mg/dL — ABNORMAL HIGH (ref 0.40–1.20)
GFR: 48.76 mL/min — ABNORMAL LOW (ref >60.00)
Glucose, Bld: 102 mg/dL — ABNORMAL HIGH (ref 70–99)
Potassium: 4.6 mEq/L (ref 3.5–5.1)
Sodium: 141 mEq/L (ref 135–145)

## 2016-09-20 LAB — CBC WITH DIFFERENTIAL/PLATELET
Basophils Absolute: 0 10*3/uL (ref 0.0–0.1)
Basophils Relative: 0.4 % (ref 0.0–3.0)
Eosinophils Absolute: 0.2 10*3/uL (ref 0.0–0.7)
Eosinophils Relative: 2 % (ref 0.0–5.0)
HCT: 35.2 % — ABNORMAL LOW (ref 36.0–46.0)
Hemoglobin: 11.9 g/dL — ABNORMAL LOW (ref 12.0–15.0)
Lymphocytes Relative: 32.1 % (ref 12.0–46.0)
Lymphs Abs: 3 10*3/uL (ref 0.7–4.0)
MCHC: 33.6 g/dL (ref 30.0–36.0)
MCV: 89.7 fl (ref 78.0–100.0)
Monocytes Absolute: 0.6 10*3/uL (ref 0.1–1.0)
Monocytes Relative: 6.6 % (ref 3.0–12.0)
Neutro Abs: 5.5 10*3/uL (ref 1.4–7.7)
Neutrophils Relative %: 58.9 % (ref 43.0–77.0)
Platelets: 325 10*3/uL (ref 150.0–400.0)
RBC: 3.93 Mil/uL (ref 3.87–5.11)
RDW: 13.5 % (ref 11.5–15.5)
WBC: 9.3 10*3/uL (ref 4.0–10.5)

## 2016-09-20 LAB — TSH: TSH: 0.7 u[IU]/mL (ref 0.35–4.50)

## 2016-09-20 LAB — HEPATIC FUNCTION PANEL
ALT: 17 U/L (ref 0–35)
AST: 14 U/L (ref 0–37)
Albumin: 4.2 g/dL (ref 3.5–5.2)
Alkaline Phosphatase: 99 U/L (ref 39–117)
Bilirubin, Direct: 0.1 mg/dL (ref 0.0–0.3)
Total Bilirubin: 0.4 mg/dL (ref 0.2–1.2)
Total Protein: 7 g/dL (ref 6.0–8.3)

## 2016-09-20 NOTE — Progress Notes (Signed)
   Subjective:    [name], female    DOB: 1946-01-29, 70 y.o.   MRN: [id number]  HPI Dizziness- sxs started ~1 week ago.  + nausea.  Dizziness upon standing- worse in the AM.  Sister passed recently from renal failure- this is 2nd sister within the month.  Pt is drinking 3 bottles of water daily.  Took a clonazepam this AM so 'i feel fine'.  Swelling- reported in abd and bilateral LEs.  Pt correlates this w/ taking Zyrtec.  When she stopped medication, sxs improved.  L ankle > R.  abd bloating- 'i feel sick and unhealthy in here'.  + stiffness, 'arthritis'- taking 2 Motrin.  Pt admits to a high salt diet.  Pt has gained 9 lbs since August   Review of Systems For ROS see HPI     Objective:   Physical Exam  Constitutional: She is oriented to person, place, and time. She appears well-developed and well-nourished. No distress.  HENT:  Head: Normocephalic and atraumatic.  TMs WNL bilaterally  Eyes: Conjunctivae and EOM are normal. Pupils are equal, round, and reactive to light.  Cardiovascular: Normal rate, regular rhythm, normal heart sounds and intact distal pulses.   Pulmonary/Chest: Effort normal and breath sounds normal. No respiratory distress. She has no wheezes. She has no rales.  Abdominal: Soft. Bowel sounds are normal. She exhibits distension (mild distension). There is no tenderness. There is no rebound and no guarding.  Neurological: She is alert and oriented to person, place, and time. She has normal reflexes. No cranial nerve deficit. Coordination normal.  Skin: Skin is warm and dry.  Psychiatric: Her behavior is normal.  Anxious, tangential thought process  Vitals reviewed.         Assessment & Plan:

## 2016-09-20 NOTE — Progress Notes (Signed)
Pre visit review using our clinic review tool, if applicable. No additional management support is needed unless otherwise documented below in the visit note. 

## 2016-09-20 NOTE — Patient Instructions (Signed)
Follow up as scheduled in Feb for our 6 month BP visit We'll notify you of your lab results and make any changes if needed Continue to drink plenty of fluids Change positions slowly!  Allow yourself time to adjust to the new position Continue to take tylenol or Motrin for the joint pain as needed The swelling seems to be gone- this is great news!! Call with any questions or concerns Hang in there!!  You can do this!!!

## 2016-09-21 ENCOUNTER — Other Ambulatory Visit: Payer: Self-pay | Admitting: Family Medicine

## 2016-09-21 DIAGNOSIS — R7989 Other specified abnormal findings of blood chemistry: Secondary | ICD-10-CM

## 2016-09-21 NOTE — Assessment & Plan Note (Signed)
Pt has hx of similar but she reports this feels different than her usual vertigo.  She has had a lot of stress recently w/ the deaths of 2 sisters.  She is asymptomatic today after taking a Clonazepam.  Suspect this is due to her poor sleep, increased stress.  Neuro and PE WNL.  Will follow closely.

## 2016-09-21 NOTE — Assessment & Plan Note (Signed)
New.  Pt reports edema- none seen on PE today.  She reports the swelling correlated w/ taking Zyrtec.  Check labs.  No need for diuretic as no signs of volume overload today.  Again reviewed need for low Na diet and increased water intake.  Will follow.

## 2016-09-21 NOTE — Assessment & Plan Note (Signed)
New.  This may be related to pt's recent stress, poor dietary choices.  Only mild distension on PE.  Stressed need for low Na diet, increased water intake.  Check labs to r/o metabolic cause.  Will follow.

## 2016-10-12 ENCOUNTER — Other Ambulatory Visit: Payer: Self-pay | Admitting: Family Medicine

## 2016-10-12 DIAGNOSIS — N63 Unspecified lump in unspecified breast: Secondary | ICD-10-CM

## 2016-10-18 ENCOUNTER — Other Ambulatory Visit (INDEPENDENT_AMBULATORY_CARE_PROVIDER_SITE_OTHER): Payer: Medicare HMO

## 2016-10-18 DIAGNOSIS — E0590 Thyrotoxicosis, unspecified without thyrotoxic crisis or storm: Secondary | ICD-10-CM

## 2016-10-18 LAB — TSH: TSH: 0.96 u[IU]/mL (ref 0.35–4.50)

## 2016-10-18 LAB — T4, FREE: Free T4: 0.61 ng/dL (ref 0.60–1.60)

## 2016-10-19 ENCOUNTER — Ambulatory Visit
Admission: RE | Admit: 2016-10-19 | Discharge: 2016-10-19 | Disposition: A | Discharge status: Home / Self Care | Payer: Medicare HMO | Source: Ambulatory Visit | Attending: Family Medicine | Admitting: Family Medicine

## 2016-10-19 DIAGNOSIS — N63 Unspecified lump in unspecified breast: Secondary | ICD-10-CM

## 2016-10-20 ENCOUNTER — Encounter: Payer: Self-pay | Admitting: General Practice

## 2016-10-21 ENCOUNTER — Ambulatory Visit: Payer: Medicare HMO | Admitting: Endocrinology

## 2016-10-29 ENCOUNTER — Telehealth: Payer: Self-pay | Admitting: Family Medicine

## 2016-10-29 NOTE — Telephone Encounter (Signed)
Last OV 09/20/16 Clonazepam last filled 09/06/16 #30 with 1

## 2016-10-29 NOTE — Telephone Encounter (Signed)
Medication filled to pharmacy as requested.   

## 2016-10-29 NOTE — Telephone Encounter (Signed)
Pt requesting refill of clonazePAM (KLONOPIN) 0.5 MG tablet.    Pharmacy:   [facility] [address] ([address]), [place] - [place] [phone] (Phone) [phone] (Fax)

## 2016-10-30 ENCOUNTER — Other Ambulatory Visit: Payer: Self-pay | Admitting: Family Medicine

## 2016-11-01 ENCOUNTER — Other Ambulatory Visit: Payer: Self-pay | Admitting: Family Medicine

## 2016-11-01 NOTE — Telephone Encounter (Signed)
Patient states she called [facility] and they are telling her they never received refill request from Dr. Virgil Torres office for clonazepam.   Pt requesting call back with status of refill request as pharmacy is telling her they don't have it but our office keeps telling her it was sent.

## 2016-11-01 NOTE — Telephone Encounter (Signed)
Called prescription in to the pharmacy again today at 10:01 am.

## 2016-11-22 ENCOUNTER — Ambulatory Visit (INDEPENDENT_AMBULATORY_CARE_PROVIDER_SITE_OTHER): Payer: Medicare HMO | Admitting: Family Medicine

## 2016-11-22 ENCOUNTER — Encounter: Payer: Self-pay | Admitting: Family Medicine

## 2016-11-22 ENCOUNTER — Encounter: Payer: Self-pay | Admitting: General Practice

## 2016-11-22 VITALS — BP 140/90 | HR 64 | Temp 98.7°F | Resp 17 | Ht 65.0 in | Wt 162.4 lb

## 2016-11-22 DIAGNOSIS — I10 Essential (primary) hypertension: Secondary | ICD-10-CM | POA: Diagnosis not present

## 2016-11-22 DIAGNOSIS — R159 Full incontinence of feces: Secondary | ICD-10-CM

## 2016-11-22 DIAGNOSIS — R195 Other fecal abnormalities: Secondary | ICD-10-CM

## 2016-11-22 LAB — CBC WITH DIFFERENTIAL/PLATELET
Basophils Absolute: 0 K/uL (ref 0.0–0.1)
Basophils Relative: 0.6 % (ref 0.0–3.0)
Eosinophils Absolute: 0.1 K/uL (ref 0.0–0.7)
Eosinophils Relative: 1.6 % (ref 0.0–5.0)
HCT: 35.5 % — ABNORMAL LOW (ref 36.0–46.0)
Hemoglobin: 11.9 g/dL — ABNORMAL LOW (ref 12.0–15.0)
Lymphocytes Relative: 35.3 % (ref 12.0–46.0)
Lymphs Abs: 2.6 K/uL (ref 0.7–4.0)
MCHC: 33.5 g/dL (ref 30.0–36.0)
MCV: 90.9 fl (ref 78.0–100.0)
Monocytes Absolute: 0.5 K/uL (ref 0.1–1.0)
Monocytes Relative: 6.9 % (ref 3.0–12.0)
Neutro Abs: 4.2 K/uL (ref 1.4–7.7)
Neutrophils Relative %: 55.6 % (ref 43.0–77.0)
Platelets: 273 K/uL (ref 150.0–400.0)
RBC: 3.91 Mil/uL (ref 3.87–5.11)
RDW: 13.5 % (ref 11.5–15.5)
WBC: 7.5 K/uL (ref 4.0–10.5)

## 2016-11-22 LAB — BASIC METABOLIC PANEL WITH GFR
BUN: 18 mg/dL (ref 6–23)
CO2: 27 meq/L (ref 19–32)
Calcium: 9.2 mg/dL (ref 8.4–10.5)
Chloride: 106 meq/L (ref 96–112)
Creatinine, Ser: 0.82 mg/dL (ref 0.40–1.20)
GFR: 88.12 mL/min
Glucose, Bld: 86 mg/dL (ref 70–99)
Potassium: 4.6 meq/L (ref 3.5–5.1)
Sodium: 139 meq/L (ref 135–145)

## 2016-11-22 NOTE — Patient Instructions (Signed)
Schedule your complete physical in 6 months and your wellness visit with our [place] around the same time [facility] notify you of your lab results and make any changes if needed We'll call you with your GI appt- we must get this bleeding and stool incontinence evaluated Call with any questions or concerns Hang in there!

## 2016-11-22 NOTE — Progress Notes (Signed)
   Subjective:    [name], female    DOB: 16-Sep-1946, 71 y.o.   MRN: [id number]  HPI HTN- chronic problem, on Amlodipine but pt has not taken medication yet today.  Denies CP, SOB, HAs, visual changes, edema.   Fecal incontinence- pt initially thought this was due to her hemorrhoids (itching, burning, bleeding).  But intermittently she will have 'black' stools- occurred this AM.  Pt reports her bowel habits will vary greatly w/ diet.  Under a lot of stress.   Review of Systems For ROS see HPI     Objective:   Physical Exam  Constitutional: She is oriented to person, place, and time. She appears well-developed and well-nourished. No distress.  HENT:  Head: Normocephalic and atraumatic.  Eyes: Conjunctivae and EOM are normal. Pupils are equal, round, and reactive to light.  Neck: Normal range of motion. Neck supple. No thyromegaly present.  Cardiovascular: Normal rate, regular rhythm, normal heart sounds and intact distal pulses.   No murmur heard. Pulmonary/Chest: Effort normal and breath sounds normal. No respiratory distress.  Abdominal: Soft. She exhibits no distension. There is no tenderness.  Genitourinary: Rectal exam shows anal tone abnormal (loose anal sphincter) and guaiac positive stool. Rectal exam shows no external hemorrhoid, no internal hemorrhoid, no fissure, no mass and no tenderness.  Genitourinary Comments: Fecal smearing of intergluteal cleft  Musculoskeletal: She exhibits no edema.  Lymphadenopathy:    She has no cervical adenopathy.  Neurological: She is alert and oriented to person, place, and time.  Skin: Skin is warm and dry.  Psychiatric: She has a normal mood and affect. Her behavior is normal.  Vitals reviewed.         Assessment & Plan:

## 2016-11-22 NOTE — Progress Notes (Signed)
Pre visit review using our clinic review tool, if applicable. No additional management support is needed unless otherwise documented below in the visit note. 

## 2016-11-23 ENCOUNTER — Ambulatory Visit: Payer: Medicare HMO | Admitting: Endocrinology

## 2016-11-23 NOTE — Assessment & Plan Note (Signed)
New.  Pt is heme + on exam and the dark stools are concerning for upper GI blood loss rather than the hemorrhoids as she assumes.  No hemorrhoids noted on exam.  Refer to GI despite pt's hesitation.  She is opposed b/c of cost.  Stressed need for her to follow up.

## 2016-11-23 NOTE — Assessment & Plan Note (Signed)
Deteriorated.  Pt has not taken her meds x2 days.  Stressed need for medication compliance.  Check labs.  No med changes at this time

## 2016-11-23 NOTE — Assessment & Plan Note (Signed)
Ongoing issue for pt.  Discussed need for GI appt when she was last here for this issue and she refused.  Now that she is having dark, heme + stools, I told her this was not negotiable.  Referral placed

## 2016-12-30 ENCOUNTER — Other Ambulatory Visit: Payer: Self-pay | Admitting: Family Medicine

## 2016-12-30 ENCOUNTER — Other Ambulatory Visit: Payer: Self-pay | Admitting: General Practice

## 2016-12-30 MED ORDER — CLONAZEPAM 0.5 MG PO TABS: 0.5000 mg | ORAL_TABLET | Freq: Two times a day (BID) | ORAL | 1 refills | Status: DC | PRN

## 2016-12-30 NOTE — Telephone Encounter (Signed)
Pt asking for refill on clonazePAM, [facility] on [email or web address]

## 2016-12-30 NOTE — Telephone Encounter (Signed)
Medication filled to pharmacy as requested.   

## 2016-12-30 NOTE — Telephone Encounter (Signed)
Last OV 12/01/16 Clonazepam last filled 11/01/16 #30 with 1

## 2017-03-10 ENCOUNTER — Other Ambulatory Visit: Payer: Self-pay | Admitting: Family Medicine

## 2017-03-23 ENCOUNTER — Other Ambulatory Visit: Payer: Self-pay | Admitting: Family Medicine

## 2017-03-23 MED ORDER — CLONAZEPAM 0.5 MG PO TABS: 0.5000 mg | ORAL_TABLET | Freq: Two times a day (BID) | ORAL | 1 refills | Status: DC | PRN

## 2017-03-23 NOTE — Telephone Encounter (Signed)
Last OV 11/22/16 Clonazepam last filled 12/30/16 #30 with 1

## 2017-03-23 NOTE — Telephone Encounter (Signed)
Medication filled to pharmacy as requested.   

## 2017-03-23 NOTE — Telephone Encounter (Signed)
Patient requesting refill of clonazePAM (KLONOPIN) 0.5 MG tablet.  Pharmacy: [facility] [address] ([address]), [place] - [place] [phone] (Phone) [phone] (Fax)

## 2017-03-30 ENCOUNTER — Encounter: Payer: Self-pay | Admitting: General Practice

## 2017-03-30 ENCOUNTER — Ambulatory Visit (INDEPENDENT_AMBULATORY_CARE_PROVIDER_SITE_OTHER): Payer: Medicare HMO | Admitting: Family Medicine

## 2017-03-30 ENCOUNTER — Encounter: Payer: Self-pay | Admitting: Family Medicine

## 2017-03-30 VITALS — BP 120/80 | HR 79 | Temp 98.6°F | Resp 16 | Ht 65.0 in | Wt 161.4 lb

## 2017-03-30 DIAGNOSIS — R42 Dizziness and giddiness: Secondary | ICD-10-CM

## 2017-03-30 DIAGNOSIS — E0590 Thyrotoxicosis, unspecified without thyrotoxic crisis or storm: Secondary | ICD-10-CM | POA: Diagnosis not present

## 2017-03-30 DIAGNOSIS — I10 Essential (primary) hypertension: Secondary | ICD-10-CM | POA: Diagnosis not present

## 2017-03-30 DIAGNOSIS — R5383 Other fatigue: Secondary | ICD-10-CM | POA: Diagnosis not present

## 2017-03-30 LAB — CBC WITH DIFFERENTIAL/PLATELET
Basophils Absolute: 0 10*3/uL (ref 0.0–0.1)
Basophils Relative: 0.7 % (ref 0.0–3.0)
Eosinophils Absolute: 0.2 10*3/uL (ref 0.0–0.7)
Eosinophils Relative: 2.5 % (ref 0.0–5.0)
HCT: 37.7 % (ref 36.0–46.0)
Hemoglobin: 12.4 g/dL (ref 12.0–15.0)
Lymphocytes Relative: 30.5 % (ref 12.0–46.0)
Lymphs Abs: 2.3 10*3/uL (ref 0.7–4.0)
MCHC: 33 g/dL (ref 30.0–36.0)
MCV: 91.3 fl (ref 78.0–100.0)
Monocytes Absolute: 0.6 10*3/uL (ref 0.1–1.0)
Monocytes Relative: 7.6 % (ref 3.0–12.0)
Neutro Abs: 4.4 10*3/uL (ref 1.4–7.7)
Neutrophils Relative %: 58.7 % (ref 43.0–77.0)
Platelets: 326 10*3/uL (ref 150.0–400.0)
RBC: 4.13 Mil/uL (ref 3.87–5.11)
RDW: 13.6 % (ref 11.5–15.5)
WBC: 7.5 10*3/uL (ref 4.0–10.5)

## 2017-03-30 LAB — LIPID PANEL
Cholesterol: 158 mg/dL (ref 0–200)
HDL: 45 mg/dL (ref >39.00)
LDL Cholesterol: 94 mg/dL (ref 0–99)
NonHDL: 113.44
Total CHOL/HDL Ratio: 4
Triglycerides: 97 mg/dL (ref 0.0–149.0)
VLDL: 19.4 mg/dL (ref 0.0–40.0)

## 2017-03-30 LAB — HEPATIC FUNCTION PANEL
ALT: 16 U/L (ref 0–35)
AST: 16 U/L (ref 0–37)
Albumin: 4.4 g/dL (ref 3.5–5.2)
Alkaline Phosphatase: 83 U/L (ref 39–117)
Bilirubin, Direct: 0.1 mg/dL (ref 0.0–0.3)
Total Bilirubin: 0.6 mg/dL (ref 0.2–1.2)
Total Protein: 6.8 g/dL (ref 6.0–8.3)

## 2017-03-30 LAB — BASIC METABOLIC PANEL
BUN: 13 mg/dL (ref 6–23)
CO2: 28 mEq/L (ref 19–32)
Calcium: 9.8 mg/dL (ref 8.4–10.5)
Chloride: 105 mEq/L (ref 96–112)
Creatinine, Ser: 0.97 mg/dL (ref 0.40–1.20)
GFR: 72.52 mL/min (ref >60.00)
Glucose, Bld: 96 mg/dL (ref 70–99)
Potassium: 4.6 mEq/L (ref 3.5–5.1)
Sodium: 140 mEq/L (ref 135–145)

## 2017-03-30 LAB — TSH: TSH: 0.75 u[IU]/mL (ref 0.35–4.50)

## 2017-03-30 LAB — T4, FREE: Free T4: 0.67 ng/dL (ref 0.60–1.60)

## 2017-03-30 LAB — T3, FREE: T3, Free: 3 pg/mL (ref 2.3–4.2)

## 2017-03-30 MED ORDER — MECLIZINE HCL 25 MG PO TABS: 25.0000 mg | ORAL_TABLET | Freq: Three times a day (TID) | ORAL | 0 refills | Status: DC | PRN

## 2017-03-30 NOTE — Progress Notes (Signed)
Pre visit review using our clinic review tool, if applicable. No additional management support is needed unless otherwise documented below in the visit note. 

## 2017-03-30 NOTE — Patient Instructions (Addendum)
Schedule your complete physical for after August 3rd [facility] notify you of your lab results and make any changes if needed Take the Meclizine as needed for dizziness Drink plenty of water Change positions slowly to avoid dizziness Call with any questions or concerns Hang in there!!

## 2017-03-30 NOTE — Assessment & Plan Note (Signed)
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  No abnormalities on PE today w/ exception of 2 beats of horizontal nystagmus when looking R.  Restart meclizine.  Reviewed supportive care and red flags that should prompt return.  Pt expressed understanding and is in agreement w/ plan.

## 2017-03-30 NOTE — Assessment & Plan Note (Signed)
Chronic problem.  Pt continues to be non-compliant w/ Endo f/u.  Continues to struggle w/ fatigue and dizziness.  Check labs and send back to Endo for needed f/u.

## 2017-03-30 NOTE — Assessment & Plan Note (Signed)
Recurrent problem.  May be related to her known thyroid disease.  Check labs to r/o other metabolic causes.  Will determine next steps based on lab results.  Pt expressed understanding and is in agreement w/ plan.

## 2017-03-30 NOTE — Assessment & Plan Note (Signed)
Chronic problem, well controlled today.  Asymptomatic w/ exception of intermittent dizziness (which is consistent w/ her previous vertigo).  Check labs.  No med changes at this time

## 2017-03-30 NOTE — Progress Notes (Signed)
   Subjective:    [name], female    DOB: Jul 08, 1946, 71 y.o.   MRN: [id number]  HPI Dizziness- pt reports sxs will 'come and go'.  Will have dizziness some mornings when waking- rolling over, getting up.  Pt reports dizziness improves when she lies or sits still.  Worse w/ motion.  Has sensation of spinning.  sxs started ~3 weeks ago.  + hx of similar.  Fatigue- pt reports she will wake up tired, 'like I haven't even gone to bed'.  Pt will go to sleep between 9-10pm and wake 5:30-6am.  Pt has hx of hyperthyroidism.  Sees Dr Dwyane Dee but has not followed up as instructed.   HTN- chronic problem, well controlled today on Amlodipine.  No CP, SOB, HAs, visual changes, edema.    Review of Systems For ROS see HPI     Objective:   Physical Exam  Constitutional: She is oriented to person, place, and time. She appears well-developed and well-nourished. No distress.  HENT:  Head: Normocephalic and atraumatic.  Eyes: Conjunctivae and EOM are normal. Pupils are equal, round, and reactive to light.  Neck: Normal range of motion. Neck supple. Thyromegaly present.  Cardiovascular: Normal rate, regular rhythm, normal heart sounds and intact distal pulses.   No murmur heard. Pulmonary/Chest: Effort normal and breath sounds normal. No respiratory distress.  Abdominal: Soft. She exhibits no distension. There is no tenderness.  Musculoskeletal: She exhibits no edema.  Lymphadenopathy:    She has no cervical adenopathy.  Neurological: She is alert and oriented to person, place, and time. No cranial nerve deficit. Coordination normal.  2 beats of horizontal nystagmus when looking R  Skin: Skin is warm and dry.  Psychiatric: She has a normal mood and affect. Her behavior is normal.          Assessment & Plan:

## 2017-06-06 ENCOUNTER — Other Ambulatory Visit: Payer: Self-pay | Admitting: *Deleted

## 2017-06-06 MED ORDER — CLONAZEPAM 0.5 MG PO TABS: 0.5000 mg | ORAL_TABLET | Freq: Two times a day (BID) | ORAL | 1 refills | Status: DC | PRN

## 2017-06-06 NOTE — Telephone Encounter (Signed)
Patient calling requesting a refill on Clonazepam 0.5mg .

## 2017-06-06 NOTE — Telephone Encounter (Signed)
rx faxed to pharmacy

## 2017-06-06 NOTE — Telephone Encounter (Signed)
Clonazepam last rx 03/23/17 #30 1 RF No CSC or UDS on file Last OV: 03/30/17   Please advise

## 2017-06-06 NOTE — Addendum Note (Signed)
Addended by: Leonidas Romberg on: 06/06/2017 09:48 AM   Modules accepted: Orders

## 2017-08-06 ENCOUNTER — Other Ambulatory Visit: Payer: Self-pay | Admitting: Family Medicine

## 2017-08-08 ENCOUNTER — Other Ambulatory Visit: Payer: Self-pay | Admitting: Family Medicine

## 2017-08-08 ENCOUNTER — Telehealth: Payer: Self-pay | Admitting: Family Medicine

## 2017-08-08 NOTE — Telephone Encounter (Signed)
Pt states that she needs a refill on KLONOPIN,  [facility] on [email or web address]

## 2017-08-09 NOTE — Telephone Encounter (Signed)
This request was routed to PCP today.

## 2017-08-09 NOTE — Telephone Encounter (Signed)
Last OV 03/30/17 Clonazepam last filled 06/06/17 #30 with 1

## 2017-08-09 NOTE — Telephone Encounter (Signed)
Medication filled to pharmacy as requested.   

## 2017-10-07 ENCOUNTER — Telehealth: Payer: Self-pay | Admitting: Family Medicine

## 2017-10-07 MED ORDER — CLONAZEPAM 0.5 MG PO TABS: 0.5000 mg | ORAL_TABLET | Freq: Two times a day (BID) | ORAL | 0 refills | Status: DC | PRN

## 2017-10-07 NOTE — Telephone Encounter (Signed)
I will provide #30, no refills (sent to pharmacy) but she is overdue for CPE

## 2017-10-07 NOTE — Telephone Encounter (Signed)
Copied from [place] [id number]. Topic: Quick Communication - Rx Refill/Question >> Oct 07, 2017 12:03 PM Lolita Rieger, RMA wrote: Has the patient contacted their pharmacy? no   (Agent: If no, request that the patient contact the pharmacy for the refill.)   Preferred Pharmacy (with phone number or street name):[email or web address]   Agent: Please be advised that RX refills may take up to 3 business days. We ask that you follow-up with your pharmacy.

## 2017-10-07 NOTE — Telephone Encounter (Signed)
Clonazepam refill. Last prescription written on 08/09/17 with 1 refill.

## 2017-10-07 NOTE — Addendum Note (Signed)
Addended by: Midge Minium on: 10/07/2017 04:32 PM   Modules accepted: Orders

## 2017-10-08 ENCOUNTER — Other Ambulatory Visit: Payer: Self-pay | Admitting: Family Medicine

## 2017-10-10 NOTE — Telephone Encounter (Signed)
Last OV 03/30/17 Clonazepam last filled 10/07/17 #30 with 0

## 2017-10-27 ENCOUNTER — Other Ambulatory Visit: Payer: Self-pay | Admitting: Family Medicine

## 2017-10-27 DIAGNOSIS — Z139 Encounter for screening, unspecified: Secondary | ICD-10-CM

## 2017-11-07 ENCOUNTER — Other Ambulatory Visit: Payer: Self-pay | Admitting: Family Medicine

## 2017-11-07 MED ORDER — CLONAZEPAM 0.5 MG PO TABS: 0.5000 mg | ORAL_TABLET | Freq: Two times a day (BID) | ORAL | 0 refills | Status: DC | PRN

## 2017-11-07 NOTE — Telephone Encounter (Signed)
Routing to correct office

## 2017-11-07 NOTE — Telephone Encounter (Signed)
Copied from [place] [id number]. Topic: Quick Communication - Rx Refill/Question >> Nov 07, 2017  2:37 PM Patrice Paradise wrote: Medication: clonazePAM (KLONOPIN) 0.5 MG tablet    Has the patient contacted their pharmacy? Yes.     (Agent: If no, request that the patient contact the pharmacy for the refill.)   Preferred Pharmacy (with phone number or street name):   [facility] ([address] [place] [address] [place] ([place]) [place] [address] Phone: [phone] Fax: [phone]    Agent: Please be advised that RX refills may take up to 3 business days. We ask that you follow-up with your pharmacy.

## 2017-11-07 NOTE — Telephone Encounter (Signed)
Last Filled: 10/07/17, 30 with 0 refills Last OV: 03/30/17  Patient is overdue for CPE, however she is scheduled for that on 12/30/17.

## 2017-11-17 ENCOUNTER — Ambulatory Visit
Admission: RE | Admit: 2017-11-17 | Discharge: 2017-11-17 | Disposition: A | Discharge status: Home / Self Care | Payer: Medicare HMO | Source: Ambulatory Visit | Attending: Family Medicine | Admitting: Family Medicine

## 2017-11-17 DIAGNOSIS — Z1231 Encounter for screening mammogram for malignant neoplasm of breast: Secondary | ICD-10-CM | POA: Diagnosis not present

## 2017-11-17 DIAGNOSIS — Z139 Encounter for screening, unspecified: Secondary | ICD-10-CM

## 2017-12-05 ENCOUNTER — Other Ambulatory Visit: Payer: Self-pay | Admitting: Family Medicine

## 2017-12-05 NOTE — Telephone Encounter (Signed)
Last OV 03/30/17 Clonazepam last filled 11/07/17 #30 with 0

## 2017-12-06 ENCOUNTER — Other Ambulatory Visit: Payer: Self-pay | Admitting: Family Medicine

## 2017-12-08 ENCOUNTER — Ambulatory Visit: Payer: Self-pay | Admitting: *Deleted

## 2017-12-08 NOTE — Telephone Encounter (Signed)
Pt called with complalnts of "tiredness'" and shooting leg pain; her greatest concern is the leg pain; the pt also  stiffness and soreness that has been going on for a month in her knees and says it is hard for her to stand up and get around;  She states that she is conerned that she may have something wrong with her heart or she may have a blood clot; additionally the pt says that she is having problems holding her urine, and feels like she has a UTI; pt offered and accepted appointment with Dr Jonni Sanger, [facility] Bali, on Friday December 09, 2017 at [facility]; pt verbalizes understanding; spoke with Levada Dy concerning appointment time and length.  Reason for Disposition . [1] MODERATE pain (e.g., interferes with normal activities, limping) AND [2] present > 3 days  Answer Assessment - Initial Assessment Questions 1. ONSET: "When did the pain start?"      Oct 25, 2017  2. LOCATION: "Where is the pain located?"      Knees to ankles 3. PAIN: "How bad is the pain?"    (Scale 1-10; or mild, moderate, severe)   -  MILD (1-3): doesn't interfere with normal activities    -  MODERATE (4-7): interferes with normal activities (e.g., work or school) or awakens from sleep, limping    -  SEVERE (8-10): excruciating pain, unable to do any normal activities, unable to walk     moderate 4. WORK OR EXERCISE: "Has there been any recent work or exercise that involved this part of the body?"      no 5. CAUSE: "What do you think is causing the leg pain?"     unsure 6. OTHER SYMPTOMS: "Do you have any other symptoms?" (e.g., chest pain, back pain, breathing difficulty, swelling, rash, fever, numbness, weakness)     short winded at times 7. PREGNANCY: "Is there any chance you are pregnant?" "When was your last menstrual period?"     no  Protocols used: LEG PAIN-A-AH

## 2017-12-09 ENCOUNTER — Encounter: Payer: Self-pay | Admitting: Family Medicine

## 2017-12-09 ENCOUNTER — Other Ambulatory Visit: Payer: Self-pay

## 2017-12-09 ENCOUNTER — Ambulatory Visit (INDEPENDENT_AMBULATORY_CARE_PROVIDER_SITE_OTHER): Payer: Medicare HMO | Admitting: Family Medicine

## 2017-12-09 VITALS — BP 104/78 | HR 67 | Temp 98.9°F

## 2017-12-09 DIAGNOSIS — M79604 Pain in right leg: Secondary | ICD-10-CM | POA: Diagnosis not present

## 2017-12-09 DIAGNOSIS — M79605 Pain in left leg: Secondary | ICD-10-CM | POA: Diagnosis not present

## 2017-12-09 DIAGNOSIS — M170 Bilateral primary osteoarthritis of knee: Secondary | ICD-10-CM | POA: Diagnosis not present

## 2017-12-09 DIAGNOSIS — R300 Dysuria: Secondary | ICD-10-CM | POA: Diagnosis not present

## 2017-12-09 MED ORDER — MELOXICAM 7.5 MG PO TABS: 7.5000 mg | ORAL_TABLET | Freq: Every day | ORAL | 0 refills | Status: DC

## 2017-12-09 MED ORDER — TRAMADOL HCL 50 MG PO TABS: 50.0000 mg | ORAL_TABLET | Freq: Four times a day (QID) | ORAL | 0 refills | Status: DC | PRN

## 2017-12-09 NOTE — Patient Instructions (Signed)
Please follow up if symptoms do not improve or as needed.   We will call you with information regarding your referral appointment. Orthopedic surgery for knee and leg pain.

## 2017-12-09 NOTE — Progress Notes (Signed)
Subjective  CC:  Chief Complaint  Patient presents with  . Leg Pain    Bi-lateral leg pain, shooting pain from knees to feet x 5-6 weeks    HPI: Margaret Torres is a 72 y.o. female who presents to the office today to address the problems listed above in the chief complaint.  72 year old female presents for bilateral diffuse leg pain ongoing for the last 6-8 weeks.  Pain comes and goes and is described as throbbing, aching and at times shooting.  Pain can be anywhere from the hip down to the ankle.  She notes she has arthritis of the knees but this has not been evaluated for many years.  She reports that she is stiff upon trying to rise after prolonged sitting and is having difficulty ambulating due to stiffness and pain.  She also reports occasional burning and numbness in the feet.  She denies weakness in the legs.  There is been no swelling.  She denies low back pain, bowel or bladder dysfunction.  From chart review, she had similar symptoms back in May 2017.  Mobic was prescribed and things got better.  Complains of one-week history of dysuria and urinary frequency without fevers chills flank pain nausea or vomiting.  Complains of fatigue I reviewed the patients updated PMH, FH, and SocHx.    Patient Active Problem List   Diagnosis Date Noted  . Edema 09/20/2016  . Abdominal bloating 09/20/2016  . Hyperthyroidism 04/21/2016  . Bowel incontinence 04/21/2016  . Leg pain, bilateral 03/02/2016  . Dysphagia 03/02/2016  . Fatigue 03/02/2016  . Palpitations 03/02/2016  . Olecranon bursitis of left elbow 01/30/2016  . Tinea corporis 06/20/2015  . Physical exam 01/27/2015  . Bilateral renal cysts 01/27/2015  . Left anterior knee pain 09/30/2014  . Acute bronchitis 09/30/2014  . Insomnia 06/20/2014  . Dark stools 06/20/2014  . Hemorrhoids, external 03/27/2014  . Dizziness 03/27/2014  . Breast pain 03/27/2014  . 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 Screening for  malignant neoplasm of the cervix 10/23/2013  . Urinary frequency 10/23/2013  . Anxiety and depression 07/16/2007  . Essential hypertension 07/16/2007  . Allergic rhinitis 07/16/2007  . Hyperlipidemia 06/26/2007  . HYPERTENSION, HX OF 06/26/2007  . TAH/BSO, HX OF 06/26/2007   Current Meds  Medication Sig  . amLODipine (NORVASC) 10 MG tablet TAKE ONE TABLET BY MOUTH ONCE DAILY  . clonazePAM (KLONOPIN) 0.5 MG tablet TAKE 1 TABLET BY MOUTH TWICE DAILY AS NEEDED (Patient taking differently: TAKE 1 TABLET BY MOUTH  AS NEEDED)  . fluticasone (FLONASE) 50 MCG/ACT nasal spray USE TWO SPRAY(S) IN EACH NOSTRIL ONCE DAILY  . [DISCONTINUED] cetirizine (ZYRTEC) 10 MG tablet Take 10 mg by mouth daily.    Allergies: Patient is allergic to influenza vaccines. Family History: Patient family history includes Breast cancer in her sister; Cancer in her other and sister; Cervical cancer in her mother; Diabetes in her brother and sister; Heart disease in her brother; Hypertension in her father and mother; Stroke in her maternal grandmother and sister. Social History:  Patient  reports that  has never smoked. she has never used smokeless tobacco. She reports that she does not drink alcohol or use drugs.  Review of Systems: Constitutional: Negative for fever malaise or anorexia Cardiovascular: negative for chest pain Respiratory: negative for SOB or persistent cough Gastrointestinal: negative for abdominal pain  Objective  Vitals: BP 104/78   Pulse 67   Temp 98.9 F (37.2 C)   LMP  06/30/2013  General: no acute distress , A&Ox3 Respiratory:  Good breath sounds bilaterally, CTAB with normal respiratory effort Musculoskeletal exam: Bilateral knees with osteoarthritic changes, no warmth or redness, positive crepitus, decreased extension worse on right.  Negative straight leg raise bilaterally, range of motion at hips is good Lower extremities without edema, cords, +2 distal pulses bilaterally Neuro exam:  +1 bilateral lower extremity reflexes and downgoing Babinski's bilaterally normal sensation to light touch. Skin:  Warm, no rashes  Assessment  1. Bilateral primary osteoarthritis of knee   2. Bilateral leg pain   3. Dysuria      Plan   Leg pain: Most likely end-stage bilateral osteoarthritis of the knee.  Refer to orthopedics for further evaluation and imaging.  Possibly steroid injections to start.  Mobic and tramadol as needed for pain.  Consider workup for peripheral neuropathy as well given burning symptoms.  Dysuria: pt was unable to void; atypical sxs for uti; will return if needed. Has f/u with Pcp. Recommend pushing fluids. May have OAB.  Fatigue: Follow-up with primary care doctor for further evaluation  Follow up: Follow-up as scheduled with PCP, referral to orthopedics made   Commons side effects, risks, benefits, and alternatives for medications and treatment plan prescribed today were discussed, and the patient expressed understanding of the given instructions. Patient is instructed to call or message via MyChart if he/she has any questions or concerns regarding our treatment plan. No barriers to understanding were identified. We discussed Red Flag symptoms and signs in detail. Patient expressed understanding regarding what to do in case of urgent or emergency type symptoms.   Medication list was reconciled, printed and provided to the patient in AVS. Patient instructions and summary information was reviewed with the patient as documented in the AVS. This note was prepared with assistance of Dragon voice recognition software. Occasional wrong-word or sound-a-like substitutions may have occurred due to the inherent limitations of voice recognition software  Orders Placed This Encounter  Procedures  . Ambulatory referral to Orthopedic Surgery  . POCT urinalysis dipstick   Meds ordered this encounter  Medications  . meloxicam (MOBIC) 7.5 MG tablet    Sig: Take 1 tablet  (7.5 mg total) by mouth daily.    Dispense:  30 tablet    Refill:  0  . traMADol (ULTRAM) 50 MG tablet    Sig: Take 1 tablet (50 mg total) by mouth every 6 (six) hours as needed for moderate pain.    Dispense:  30 tablet    Refill:  0

## 2017-12-30 ENCOUNTER — Ambulatory Visit (INDEPENDENT_AMBULATORY_CARE_PROVIDER_SITE_OTHER): Payer: Medicare HMO | Admitting: Family Medicine

## 2017-12-30 ENCOUNTER — Encounter: Payer: Self-pay | Admitting: Family Medicine

## 2017-12-30 VITALS — BP 132/84 | HR 72 | Temp 98.7°F | Resp 18 | Ht 65.0 in | Wt 165.6 lb

## 2017-12-30 DIAGNOSIS — E042 Nontoxic multinodular goiter: Secondary | ICD-10-CM

## 2017-12-30 DIAGNOSIS — Z79899 Other long term (current) drug therapy: Secondary | ICD-10-CM | POA: Diagnosis not present

## 2017-12-30 DIAGNOSIS — R350 Frequency of micturition: Secondary | ICD-10-CM | POA: Diagnosis not present

## 2017-12-30 DIAGNOSIS — I10 Essential (primary) hypertension: Secondary | ICD-10-CM | POA: Diagnosis not present

## 2017-12-30 DIAGNOSIS — Z0000 Encounter for general adult medical examination without abnormal findings: Secondary | ICD-10-CM

## 2017-12-30 DIAGNOSIS — Z0001 Encounter for general adult medical examination with abnormal findings: Secondary | ICD-10-CM | POA: Diagnosis not present

## 2017-12-30 HISTORY — DX: Nontoxic multinodular goiter: E04.2

## 2017-12-30 LAB — POC URINALSYSI DIPSTICK (AUTOMATED)
Bilirubin, UA: NEGATIVE
Glucose, UA: NEGATIVE
Ketones, UA: NEGATIVE
Nitrite, UA: POSITIVE
Protein, UA: NEGATIVE
Spec Grav, UA: 1.02 (ref 1.010–1.025)
Urobilinogen, UA: 0.2 E.U./dL
pH, UA: 6 (ref 5.0–8.0)

## 2017-12-30 LAB — HEPATIC FUNCTION PANEL
ALT: 18 U/L (ref 0–35)
AST: 18 U/L (ref 0–37)
Albumin: 4.4 g/dL (ref 3.5–5.2)
Alkaline Phosphatase: 80 U/L (ref 39–117)
Bilirubin, Direct: 0.1 mg/dL (ref 0.0–0.3)
Total Bilirubin: 0.4 mg/dL (ref 0.2–1.2)
Total Protein: 7.3 g/dL (ref 6.0–8.3)

## 2017-12-30 LAB — CBC WITH DIFFERENTIAL/PLATELET
Basophils Absolute: 0 10*3/uL (ref 0.0–0.1)
Basophils Relative: 0.6 % (ref 0.0–3.0)
Eosinophils Absolute: 0.2 10*3/uL (ref 0.0–0.7)
Eosinophils Relative: 2.5 % (ref 0.0–5.0)
HCT: 34.9 % — ABNORMAL LOW (ref 36.0–46.0)
Hemoglobin: 11.8 g/dL — ABNORMAL LOW (ref 12.0–15.0)
Lymphocytes Relative: 35.1 % (ref 12.0–46.0)
Lymphs Abs: 2.9 10*3/uL (ref 0.7–4.0)
MCHC: 33.8 g/dL (ref 30.0–36.0)
MCV: 90.6 fl (ref 78.0–100.0)
Monocytes Absolute: 0.6 10*3/uL (ref 0.1–1.0)
Monocytes Relative: 6.7 % (ref 3.0–12.0)
Neutro Abs: 4.6 10*3/uL (ref 1.4–7.7)
Neutrophils Relative %: 55.1 % (ref 43.0–77.0)
Platelets: 292 10*3/uL (ref 150.0–400.0)
RBC: 3.85 Mil/uL — ABNORMAL LOW (ref 3.87–5.11)
RDW: 13.4 % (ref 11.5–15.5)
WBC: 8.4 10*3/uL (ref 4.0–10.5)

## 2017-12-30 LAB — BASIC METABOLIC PANEL
BUN: 18 mg/dL (ref 6–23)
CO2: 26 mEq/L (ref 19–32)
Calcium: 9.4 mg/dL (ref 8.4–10.5)
Chloride: 103 mEq/L (ref 96–112)
Creatinine, Ser: 0.89 mg/dL (ref 0.40–1.20)
GFR: 79.93 mL/min (ref >60.00)
Glucose, Bld: 86 mg/dL (ref 70–99)
Potassium: 4 mEq/L (ref 3.5–5.1)
Sodium: 138 mEq/L (ref 135–145)

## 2017-12-30 LAB — LIPID PANEL
Cholesterol: 171 mg/dL (ref 0–200)
HDL: 51.7 mg/dL (ref >39.00)
LDL Cholesterol: 99 mg/dL (ref 0–99)
NonHDL: 119.69
Total CHOL/HDL Ratio: 3
Triglycerides: 101 mg/dL (ref 0.0–149.0)
VLDL: 20.2 mg/dL (ref 0.0–40.0)

## 2017-12-30 LAB — TSH: TSH: 1.47 u[IU]/mL (ref 0.35–4.50)

## 2017-12-30 MED ORDER — CLONAZEPAM 0.5 MG PO TABS: 0.5000 mg | ORAL_TABLET | Freq: Two times a day (BID) | ORAL | 1 refills | Status: DC | PRN

## 2017-12-30 MED ORDER — CEPHALEXIN 500 MG PO CAPS: 500.0000 mg | ORAL_CAPSULE | Freq: Two times a day (BID) | ORAL | 0 refills | Status: AC

## 2017-12-30 NOTE — Progress Notes (Signed)
   Subjective:    [name], female    DOB: Mar 06, 1946, 72 y.o.   MRN: [id number]  HPI CPE- UTD on colonoscopy, mammo, Pneumovax.  Due for Prevnar- pt declines  Urinary frequency- x1 month.  No dysuria.     Review of Systems Patient reports no vision/ hearing changes, adenopathy,fever, weight change,  persistant/recurrent hoarseness , swallowing issues, chest pain, palpitations, edema, persistant/recurrent cough, hemoptysis, dyspnea (rest/exertional/paroxysmal nocturnal), gastrointestinal bleeding (melena, rectal bleeding), abdominal pain, significant heartburn, bowel changes, Gyn symptoms (abnormal  bleeding, pain),  syncope, focal weakness, memory loss, numbness & tingling, skin/hair/nail changes, abnormal bruising or bleeding, anxiety, or depression.     Objective:   Physical Exam General Appearance:    Alert, cooperative, no distress, appears stated age  Head:    Normocephalic, without obvious abnormality, atraumatic  Eyes:    PERRL, conjunctiva/corneas clear, EOM's intact, fundi    benign, both eyes  Ears:    Normal TM's and external ear canals, both ears  Nose:   Nares normal, septum midline, mucosa normal, no drainage    or sinus tenderness  Throat:   Lips, mucosa, and tongue normal; teeth and gums normal  Neck:   Supple, symmetrical, trachea midline, no adenopathy;    Thyroid: multiple nodules present  Back:     Symmetric, no curvature, ROM normal, no CVA tenderness  Lungs:     Clear to auscultation bilaterally, respirations unlabored  Chest Wall:    No tenderness or deformity   Heart:    Regular rate and rhythm, S1 and S2 normal, no murmur, rub   or gallop  Breast Exam:    Deferred to mammo  Abdomen:     Soft, non-tender, bowel sounds active all four quadrants,    no masses, no organomegaly  Genitalia:    Deferred  Rectal:    Extremities:   Extremities normal, atraumatic, no cyanosis or edema  Pulses:   2+ and symmetric all extremities  Skin:   Skin  color, texture, turgor normal, no rashes or lesions  Lymph nodes:   Cervical, supraclavicular, and axillary nodes normal  Neurologic:   CNII-XII intact, normal strength, sensation and reflexes    throughout          Assessment & Plan:

## 2017-12-30 NOTE — Assessment & Plan Note (Signed)
New.  Pt's sxs and UA consistent w/ infxn.  Start Keflex.

## 2017-12-30 NOTE — Assessment & Plan Note (Signed)
Pt's PE WNL w/ exception of thyroid nodules.  UTD on colonoscopy and mammo.  Declines Prevnar.  Check labs.  Anticipatory guidance provided.

## 2017-12-30 NOTE — Addendum Note (Signed)
Addended by: Ralph Dowdy on: 12/30/2017 03:18 PM   Modules accepted: Orders

## 2017-12-30 NOTE — Assessment & Plan Note (Signed)
Pt had multiple thyroid nodules noted on [name] Oct 2017 and bx was recommended.  Endo was managing this and for whatever reason, bx did not occur.  Offered bx today but pt declines- willing to repeat US and then see what she wants to do

## 2017-12-30 NOTE — Assessment & Plan Note (Signed)
Chronic problem.  Adequate control today.  Asymptomatic.  Check labs.  No anticipated med changes.  Will follow. 

## 2017-12-30 NOTE — Patient Instructions (Signed)
Follow up in 6 months to recheck BP We'll notify you of your lab results and make any changes if needed Continue to work on healthy diet and exercise as able We'll call you with your appt for the thyroid ultrasound START the Keflex twice daily- w/ food- for the UTI Drink plenty of water Call with any questions or concerns Happy Spring!!!

## 2017-12-30 NOTE — Addendum Note (Signed)
Addended by: Katina Dung on: 12/30/2017 03:10 PM   Modules accepted: Orders

## 2018-01-02 ENCOUNTER — Encounter: Payer: Self-pay | Admitting: General Practice

## 2018-01-02 LAB — PAIN MGMT, PROFILE 8 W/CONF, U
6 Acetylmorphine: NEGATIVE ng/mL (ref <10)
Alcohol Metabolites: NEGATIVE ng/mL (ref <500)
Alphahydroxyalprazolam: NEGATIVE ng/mL (ref <25)
Alphahydroxymidazolam: NEGATIVE ng/mL (ref <50)
Alphahydroxytriazolam: NEGATIVE ng/mL (ref <50)
Aminoclonazepam: 149 ng/mL — ABNORMAL HIGH (ref <25)
Amphetamines: NEGATIVE ng/mL (ref <500)
Benzodiazepines: POSITIVE ng/mL — AB (ref <100)
Buprenorphine, Urine: NEGATIVE ng/mL (ref <5)
Cocaine Metabolite: NEGATIVE ng/mL (ref <150)
Creatinine: 84.6 mg/dL
Hydroxyethylflurazepam: NEGATIVE ng/mL (ref <50)
Lorazepam: NEGATIVE ng/mL (ref <50)
MDMA: NEGATIVE ng/mL (ref <500)
Marijuana Metabolite: NEGATIVE ng/mL (ref <20)
Nordiazepam: NEGATIVE ng/mL (ref <50)
Opiates: NEGATIVE ng/mL (ref <100)
Oxazepam: NEGATIVE ng/mL (ref <50)
Oxidant: NEGATIVE ug/mL (ref <200)
Oxycodone: NEGATIVE ng/mL (ref <100)
Temazepam: NEGATIVE ng/mL (ref <50)
pH: 6.03 (ref 4.5–9.0)

## 2018-01-02 LAB — URINE CULTURE
MICRO NUMBER:: 90363585
SPECIMEN QUALITY:: ADEQUATE

## 2018-01-17 ENCOUNTER — Other Ambulatory Visit: Payer: Self-pay | Admitting: Family Medicine

## 2018-01-18 ENCOUNTER — Ambulatory Visit (HOSPITAL_BASED_OUTPATIENT_CLINIC_OR_DEPARTMENT_OTHER)
Admission: RE | Admit: 2018-01-18 | Discharge: 2018-01-18 | Disposition: A | Discharge status: Home / Self Care | Payer: Medicare HMO | Source: Ambulatory Visit | Attending: Family Medicine | Admitting: Family Medicine

## 2018-01-18 DIAGNOSIS — E042 Nontoxic multinodular goiter: Secondary | ICD-10-CM | POA: Insufficient documentation

## 2018-03-02 ENCOUNTER — Other Ambulatory Visit: Payer: Self-pay | Admitting: Family Medicine

## 2018-03-02 NOTE — Telephone Encounter (Signed)
Last OV 12/30/17 Clonazepam last filled 12/30/17 #30 with 1

## 2018-03-02 NOTE — Telephone Encounter (Signed)
Copied from [place] [id number]. Topic: Quick Communication - Rx Refill/Question >> Mar 02, 2018  1:40 PM Percell Belt A wrote: Medication:  clonazePAM (KLONOPIN) 0.5 MG tablet [[id number]] Has the patient contacted their pharmacy? no (Agent: If no, request that the patient contact the pharmacy for the refill.) (Agent: If yes, when and what did the pharmacy advise?)  Preferred Pharmacy (with phone number or street name): [facility] on [address]   Agent: Please be advised that RX refills may take up to 3 business days. We ask that you follow-up with your pharmacy.

## 2018-03-31 DIAGNOSIS — H524 Presbyopia: Secondary | ICD-10-CM | POA: Diagnosis not present

## 2018-05-02 DIAGNOSIS — Z0100 Encounter for examination of eyes and vision without abnormal findings: Secondary | ICD-10-CM | POA: Diagnosis not present

## 2018-05-03 ENCOUNTER — Telehealth: Payer: Self-pay | Admitting: Family Medicine

## 2018-05-03 ENCOUNTER — Other Ambulatory Visit: Payer: Self-pay | Admitting: Family Medicine

## 2018-05-03 NOTE — Telephone Encounter (Signed)
Copied from [place] [id number]. Topic: Quick Communication - Rx Refill/Question >> May 03, 2018 10:16 AM Yvette Rack wrote: Medication: clonazePAM (KLONOPIN) 0.5 MG tablet  pt leaving to go out of town tomorrow  Has the patient contacted their pharmacy? Yes.   (Agent: If no, request that the patient contact the pharmacy for the refill.) (Agent: If yes, when and what did the pharmacy advise?)  they will fax over request today   Preferred Pharmacy (with phone number or street name):     [facility] ([address]), [place] [phone] (Phone) [phone] (Fax)      Agent: Please be advised that RX refills may take up to 3 business days. We ask that you follow-up with your pharmacy.

## 2018-05-03 NOTE — Telephone Encounter (Signed)
Last OV 12/30/17 Clonazepam last filled 03/02/18 #30 with 1

## 2018-05-04 NOTE — Telephone Encounter (Signed)
Filled at preferred RX 05/03/18

## 2018-05-30 ENCOUNTER — Ambulatory Visit: Payer: Self-pay | Admitting: *Deleted

## 2018-05-30 ENCOUNTER — Other Ambulatory Visit: Payer: Self-pay | Admitting: Family Medicine

## 2018-05-30 NOTE — Telephone Encounter (Signed)
Last OV 12/30/17 Clonazepam last filled 05/03/18 #30 with 0

## 2018-05-30 NOTE — Telephone Encounter (Signed)
Pt called with having some nervousness over the last 2 weeks. Sometimes she just has the feeling of being anxious. She takes clonazepam usually once a day as needed. She sometimes have chest tightness with it. She is remembering her son's death, his birthday is coming up. And she would like to have something before school starts on Sept 3 rd.  She is also stating that she is having difficulty with swallowing solids and this has been going on since she had problems with her thyroid in 2017.  Can swallow liquid without difficulty. She also states that she has gain weight without eating more. Request appointment for next Monday. Advised to call back if having increase in nervousness and difficulty swallowing. Pt voiced understandings. Routing to [facility] at [facility]. Reason for Disposition . Symptoms interfere with sleep . Difficulty swallowing is a chronic symptom (recurrent or ongoing AND present > 4 weeks)  Answer Assessment - Initial Assessment Questions 1. CONCERN: "What happened that made you call today?"     Increasing nervousness, anxious feeling 2. ANXIETY SYMPTOM SCREENING: "Can you describe how you have been feeling?"  (e.g., tense, restless, panicky, anxious, keyed up, trouble sleeping, trouble concentrating)     On the way home started feeling very nervous 3. ONSET: "How long have you been feeling this way?"     2 weeks  4. RECURRENT: "Have you felt this way before?"  If yes: "What happened that time?" "What helped these feelings go away in the past?"      Yes after having a hysterectomy 5. RISK OF HARM - SUICIDAL IDEATION:  "Do you ever have thoughts of hurting or killing yourself?"  (e.g., yes, no, no but preoccupation with thoughts about death)   - INTENT:  "Do you have thoughts of hurting or killing yourself right NOW?" (e.g., yes, no, N/A)   - PLAN: "Do you have a specific plan for how you would do this?" (e.g., gun, knife, overdose, no plan, N/A)     no 6. RISK OF HARM -  HOMICIDAL IDEATION:  "Do you ever have thoughts of hurting or killing someone else?"  (e.g., yes, no, no but preoccupation with thoughts about death)   - INTENT:  "Do you have thoughts of hurting or killing someone right NOW?" (e.g., yes, no, N/A)   - PLAN: "Do you have a specific plan for how you would do this?" (e.g., gun, knife, no plan, N/A)      no 7. FUNCTIONAL IMPAIRMENT: "How have things been going for you overall in your life? Have you had any more difficulties than usual doing your normal daily activities?"  (e.g., better, same, worse; self-care, school, work, interactions)     Losing family members, son's birthday coming up 24. SUPPORT: "Who is with you now?" "Who do you live with?" "Do you have family or friends nearby who you can talk to?"      Feels like she did when she was had to take thyroid medication 9. THERAPIST: "Do you have a counselor or therapist? Name?"     Had to see one in 1987 10. STRESSORS: "Has there been any new stress or recent changes in your life?"       Son's death 40. CAFFEINE ABUSE: "Do you drink caffeinated beverages, and how much each day?" (e.g., coffee, tea, colas)       no 12. SUBSTANCE ABUSE: "Do you use any illegal drugs or alcohol?"       no 13. OTHER SYMPTOMS: "Do you have  any other physical symptoms right now?" (e.g., chest pain, palpitations, difficulty breathing, fever)       Chest starts to feel a little tight  Answer Assessment - Initial Assessment Questions 1. SYMPTOM: "Are you having difficulty swallowing liquids, solids, or both?"     solids 2. ONSET: "When did the swallowing problems begin?"      2 or 3 weeks 3. CAUSE: "What do you think is causing the problem?"      Not sure 4. CHRONIC/RECURRENT: "Is this a new problem for you?"  If no, ask: "How long have you had this problem?" (e.g., days, weeks, months)      No when she was having  problems with her thyroid 5. OTHER SYMPTOMS: "Do you have any other symptoms?" (e.g., difficulty  breathing, sore throat, swollen tongue, chest pain)     no  Protocols used: ANXIETY AND PANIC ATTACK-A-AH, SWALLOWING DIFFICULTY-A-AH

## 2018-06-05 ENCOUNTER — Other Ambulatory Visit: Payer: Self-pay

## 2018-06-05 ENCOUNTER — Encounter: Payer: Self-pay | Admitting: Family Medicine

## 2018-06-05 ENCOUNTER — Ambulatory Visit (INDEPENDENT_AMBULATORY_CARE_PROVIDER_SITE_OTHER): Payer: Medicare HMO | Admitting: Family Medicine

## 2018-06-05 VITALS — BP 130/80 | HR 55 | Temp 99.0°F | Resp 16 | Ht 65.0 in | Wt 163.4 lb

## 2018-06-05 DIAGNOSIS — E0590 Thyrotoxicosis, unspecified without thyrotoxic crisis or storm: Secondary | ICD-10-CM

## 2018-06-05 DIAGNOSIS — F419 Anxiety disorder, unspecified: Secondary | ICD-10-CM | POA: Diagnosis not present

## 2018-06-05 DIAGNOSIS — F329 Major depressive disorder, single episode, unspecified: Secondary | ICD-10-CM | POA: Diagnosis not present

## 2018-06-05 DIAGNOSIS — F32A Depression, unspecified: Secondary | ICD-10-CM

## 2018-06-05 LAB — T4, FREE: Free T4: 0.69 ng/dL (ref 0.60–1.60)

## 2018-06-05 LAB — TSH: TSH: 1.03 u[IU]/mL (ref 0.35–4.50)

## 2018-06-05 LAB — T3, FREE: T3, Free: 3.4 pg/mL (ref 2.3–4.2)

## 2018-06-05 MED ORDER — FLUTICASONE PROPIONATE 50 MCG/ACT NA SUSP: NASAL | 6 refills | Status: DC

## 2018-06-05 MED ORDER — SERTRALINE HCL 25 MG PO TABS: 25.0000 mg | ORAL_TABLET | Freq: Every day | ORAL | 3 refills | Status: DC

## 2018-06-05 NOTE — Assessment & Plan Note (Signed)
Deteriorated.  Pt is very lonely w/ the deaths of family and friends.  Her PHQ 9 score is 13.  She has been resistant to medication in the past but is willing to 'try anything' today.  Based on this, will start low dose Sertraline and monitor closely for improvement.  Pt expressed understanding and is in agreement w/ plan.

## 2018-06-05 NOTE — Assessment & Plan Note (Signed)
Chronic problem.  Has not seen Endocrinology recently.  Check labs and determine next steps.

## 2018-06-05 NOTE — Progress Notes (Signed)
   Subjective:    [name], female    DOB: 1946-04-12, 72 y.o.   MRN: [id number]  HPI 'I just feel so depressed all the time'- pt is not sleeping well, wants to cry 'all the time'.  Will wake w/ 'mind racing'.  Is feeling hopeless.  'all my friends passed away.  All my family passed away'.  Pt is very lonely.  Went on a Seniors Trip on Friday to [place] in hopes of meeting a new friend, 'I didn't'.  Son's birthday is tomorrow, 'he would be 51'.  PHQ 9 = 13.  Pt is open to starting SSRI, 'i'll try anything'.    Hyperthyroid- pt is very anxious about her 'levels'.  Asking for labs.  Review of Systems For ROS see HPI     Objective:   Physical Exam  Constitutional: She is oriented to person, place, and time. She appears well-developed and well-nourished.  HENT:  Head: Normocephalic and atraumatic.  Neurological: She is alert and oriented to person, place, and time. No cranial nerve deficit. Coordination normal.  Skin: Skin is warm and dry.  Psychiatric: Her behavior is normal. Judgment and thought content normal.  Tearful, depressed, lonely  Vitals reviewed.         Assessment & Plan:

## 2018-06-05 NOTE — Patient Instructions (Addendum)
Follow up in 3-4 weeks to recheck mood We'll notify you of your lab results and make any changes if needed START the Sertraline daily to improve your anxiety and mood Continue the Clonazepam as directed Call with any questions or concerns Hang in there!  You're doing great!

## 2018-06-07 ENCOUNTER — Telehealth: Payer: Self-pay | Admitting: Family Medicine

## 2018-06-07 NOTE — Telephone Encounter (Signed)
Please advise 

## 2018-06-07 NOTE — Telephone Encounter (Signed)
Pt should take 1/2 tab and take this prior to bed to minimize side effects

## 2018-06-07 NOTE — Telephone Encounter (Signed)
Patient notified of PCP recommendations and is agreement and expresses an understanding.   Ok for PEC to Discuss results / PCP recommendations / Schedule patient.   

## 2018-06-07 NOTE — Telephone Encounter (Signed)
Copied from [place] [id number]. Topic: Quick Communication - Rx Refill/Question >> Jun 07, 2018 12:10 PM Margot Ables wrote: Medication: zoloft - pt states it made her very nauseous and felt like arms and legs were going to shake but didn't - pt has not taken since 06/06/18 - please advise.

## 2018-06-26 ENCOUNTER — Other Ambulatory Visit: Payer: Self-pay | Admitting: Family Medicine

## 2018-06-26 ENCOUNTER — Telehealth: Payer: Self-pay | Admitting: Family Medicine

## 2018-06-26 NOTE — Telephone Encounter (Signed)
Copied from [place] [id number]. Topic: General - Other >> Jun 26, 2018 11:40 AM Janace Aris A wrote: Medication: clonazePAM [place]bye [place])  Has the patient contacted their pharmacy? YES  (Agent: If yes, when and what did the pharmacy advise? Patient went through automated system to fill Rx.    Preferred Pharmacy (with phone number or street name): [facility] ([address] [place]  [phone] (Phone) [phone] (Fax)    Agent: Please be advised that RX refills may take up to 3 business days. We ask that you follow-up with your pharmacy.

## 2018-06-26 NOTE — Telephone Encounter (Signed)
Last OV: 06/05/2018 Last Fill: 05/31/2018

## 2018-06-26 NOTE — Telephone Encounter (Signed)
Pt called and made aware RX ready at pharmacy.

## 2018-10-12 ENCOUNTER — Encounter: Payer: Self-pay | Admitting: Family Medicine

## 2018-10-12 ENCOUNTER — Ambulatory Visit (INDEPENDENT_AMBULATORY_CARE_PROVIDER_SITE_OTHER): Payer: Medicare HMO | Admitting: Family Medicine

## 2018-10-12 ENCOUNTER — Other Ambulatory Visit: Payer: Self-pay

## 2018-10-12 VITALS — BP 145/92 | HR 72 | Temp 98.5°F | Resp 17 | Ht 65.0 in | Wt 159.8 lb

## 2018-10-12 DIAGNOSIS — M25561 Pain in right knee: Secondary | ICD-10-CM | POA: Diagnosis not present

## 2018-10-12 DIAGNOSIS — K219 Gastro-esophageal reflux disease without esophagitis: Secondary | ICD-10-CM

## 2018-10-12 DIAGNOSIS — R296 Repeated falls: Secondary | ICD-10-CM

## 2018-10-12 HISTORY — DX: Gastro-esophageal reflux disease without esophagitis: K21.9

## 2018-10-12 LAB — CBC WITH DIFFERENTIAL/PLATELET
Basophils Absolute: 0 10*3/uL (ref 0.0–0.1)
Basophils Relative: 0.6 % (ref 0.0–3.0)
Eosinophils Absolute: 0.1 10*3/uL (ref 0.0–0.7)
Eosinophils Relative: 0.9 % (ref 0.0–5.0)
HCT: 37.6 % (ref 36.0–46.0)
Hemoglobin: 12.6 g/dL (ref 12.0–15.0)
Lymphocytes Relative: 29.6 % (ref 12.0–46.0)
Lymphs Abs: 1.8 10*3/uL (ref 0.7–4.0)
MCHC: 33.6 g/dL (ref 30.0–36.0)
MCV: 90.9 fl (ref 78.0–100.0)
Monocytes Absolute: 0.4 10*3/uL (ref 0.1–1.0)
Monocytes Relative: 6.6 % (ref 3.0–12.0)
Neutro Abs: 3.8 10*3/uL (ref 1.4–7.7)
Neutrophils Relative %: 62.3 % (ref 43.0–77.0)
Platelets: 309 10*3/uL (ref 150.0–400.0)
RBC: 4.14 Mil/uL (ref 3.87–5.11)
RDW: 13.2 % (ref 11.5–15.5)
WBC: 6 10*3/uL (ref 4.0–10.5)

## 2018-10-12 LAB — BASIC METABOLIC PANEL
BUN: 14 mg/dL (ref 6–23)
CO2: 26 mEq/L (ref 19–32)
Calcium: 9.6 mg/dL (ref 8.4–10.5)
Chloride: 105 mEq/L (ref 96–112)
Creatinine, Ser: 1.05 mg/dL (ref 0.40–1.20)
GFR: 65.9 mL/min (ref >60.00)
Glucose, Bld: 100 mg/dL — ABNORMAL HIGH (ref 70–99)
Potassium: 4.1 mEq/L (ref 3.5–5.1)
Sodium: 140 mEq/L (ref 135–145)

## 2018-10-12 LAB — HEPATIC FUNCTION PANEL
ALT: 15 U/L (ref 0–35)
AST: 15 U/L (ref 0–37)
Albumin: 4.4 g/dL (ref 3.5–5.2)
Alkaline Phosphatase: 67 U/L (ref 39–117)
Bilirubin, Direct: 0.1 mg/dL (ref 0.0–0.3)
Total Bilirubin: 0.6 mg/dL (ref 0.2–1.2)
Total Protein: 7.1 g/dL (ref 6.0–8.3)

## 2018-10-12 LAB — TSH: TSH: 1.08 u[IU]/mL (ref 0.35–4.50)

## 2018-10-12 MED ORDER — PANTOPRAZOLE SODIUM 40 MG PO TBEC: 40.0000 mg | DELAYED_RELEASE_TABLET | Freq: Every day | ORAL | 3 refills | Status: DC

## 2018-10-12 NOTE — Progress Notes (Signed)
   Subjective:    [name], female    DOB: 10/25/1945, 73 y.o.   MRN: [id number]  HPI Falls- 'i'm always falling'.  Pt reports falling when she gets up to use the bathroom at night.  'I lose my balance a lot'.  'my legs are not working right- they feel tired and weak'.  'I fall so hard'.  Pt denies blacking out.  'i'm always tripping on something, like I have no control'.  Knee Pain- Pt reports pain from R knee down to ankle.  Pain is worse at night.  Pt is altering her gait b/c she feels unable to bear full weight on her R leg.  'I feel so sick and hurting in my stomach'- pt reports she will have pain when she wakes up or her stomach is empty.  + sour brash.  Pain improves w/ eating.     Review of Systems For ROS see HPI     Objective:   Physical Exam Vitals signs reviewed.  Constitutional:      General: She is not in acute distress.    Appearance: She is well-developed.  HENT:     Head: Normocephalic and atraumatic.  Eyes:     Conjunctiva/sclera: Conjunctivae normal.     Pupils: Pupils are equal, round, and reactive to light.  Neck:     Musculoskeletal: Normal range of motion and neck supple.     Thyroid: No thyromegaly.  Cardiovascular:     Rate and Rhythm: Normal rate and regular rhythm.     Heart sounds: Normal heart sounds. No murmur.  Pulmonary:     Effort: Pulmonary effort is normal. No respiratory distress.     Breath sounds: Normal breath sounds.  Abdominal:     General: There is no distension.     Palpations: Abdomen is soft.     Tenderness: There is no abdominal tenderness.  Musculoskeletal:        General: Tenderness (diffuse TTP over R knee) present. No swelling.     Comments: Pain w/ flexion of R knee past 90, pain w/ full extension  Lymphadenopathy:     Cervical: No cervical adenopathy.  Skin:    General: Skin is warm and dry.  Neurological:     Mental Status: She is alert and oriented to person, place, and time.     Sensory: No sensory  deficit.     Gait: Gait normal.     Deep Tendon Reflexes: Reflexes normal.  Psychiatric:        Behavior: Behavior normal.           Assessment & Plan:  Acute pain of R knee- new to provider.  Unclear if this is causing pt to fall (I believe falls are multifactorial) but it is certainly contributing.  She is having pain w/ flexion and extension.  At times she feels leg 'gives out'.  Based on this, will refer to Sports Med for complete evaluation and tx.  Pt expressed understanding and is in agreement w/ plan.

## 2018-10-12 NOTE — Assessment & Plan Note (Signed)
New.  Pt reports epigastric pain, nausea, sour brash, bloating, belching.  Pain is worse when stomach is empty.  Given sxs, will start PPI and monitor closely for improvement.  Pt expressed understanding and is in agreement w/ plan.

## 2018-10-12 NOTE — Assessment & Plan Note (Signed)
New.  Suspect this is multifactorial- pt is orthostatic on exam today, has R knee pain that she won't fully bear weight on.  We will refer to Sports Med for the knee.  Stressed need for her to take her BP meds regularly to avoid high blood pressure and large drops upon standing.  Encouraged increased water intake.  If falls don't improve, will need neuro work up.  Pt refused at this time due to her fixed income and high specialty co-pay.  Check labs to see if metabolic cause for fall.  Will follow.

## 2018-10-12 NOTE — Patient Instructions (Signed)
Follow up in 3-4 weeks to recheck your stomach pain We'll notify you of your lab results and make any changes if needed START the Pantoprazole daily to decrease the acid and improve your stomach pain Make sure you are eating regularly- this will avoid your stomach being empty and help the pain We'll call you with your Sports Medicine appt for the knee pain Make sure you increase your water intake and change positions slowly.  If you continue to fall, we will need to send you to Neurology for a full workup Take your blood pressure medication regularly Call with any questions or concerns Hang in there!!!

## 2018-10-14 ENCOUNTER — Other Ambulatory Visit: Payer: Self-pay | Admitting: Family Medicine

## 2018-10-16 ENCOUNTER — Other Ambulatory Visit: Payer: Self-pay | Admitting: Family Medicine

## 2018-10-16 NOTE — Telephone Encounter (Signed)
Last OV 10/12/18 Clonazepam last filled 06/26/18 #30 with 3

## 2018-10-20 ENCOUNTER — Ambulatory Visit (INDEPENDENT_AMBULATORY_CARE_PROVIDER_SITE_OTHER)
Admission: RE | Admit: 2018-10-20 | Discharge: 2018-10-20 | Disposition: A | Discharge status: Home / Self Care | Payer: Medicare HMO | Source: Ambulatory Visit | Attending: Family Medicine | Admitting: Family Medicine

## 2018-10-20 ENCOUNTER — Encounter: Payer: Self-pay | Admitting: Family Medicine

## 2018-10-20 ENCOUNTER — Ambulatory Visit (INDEPENDENT_AMBULATORY_CARE_PROVIDER_SITE_OTHER): Payer: Medicare HMO | Admitting: Family Medicine

## 2018-10-20 VITALS — BP 140/90 | HR 69 | Resp 16 | Ht 65.0 in | Wt 159.0 lb

## 2018-10-20 DIAGNOSIS — M170 Bilateral primary osteoarthritis of knee: Secondary | ICD-10-CM

## 2018-10-20 DIAGNOSIS — M1712 Unilateral primary osteoarthritis, left knee: Secondary | ICD-10-CM | POA: Diagnosis not present

## 2018-10-20 DIAGNOSIS — M1711 Unilateral primary osteoarthritis, right knee: Secondary | ICD-10-CM | POA: Diagnosis not present

## 2018-10-20 HISTORY — DX: Bilateral primary osteoarthritis of knee: M17.0

## 2018-10-20 MED ORDER — NAPROXEN 500 MG PO TABS: 500.0000 mg | ORAL_TABLET | Freq: Two times a day (BID) | ORAL | 0 refills | Status: AC | PRN

## 2018-10-20 MED ORDER — DICLOFENAC SODIUM 2 % TD SOLN: 1.0000 "application " | Freq: Two times a day (BID) | TRANSDERMAL | 3 refills | Status: DC

## 2018-10-20 NOTE — Patient Instructions (Signed)
Nice to meet you  Please try ice on the knees  Please try the naproxen for 5 days straight and then as needed  Please try the exercises  The representative of the brace will call you about getting measured.  Please follow up with me in 3-4 weeks if no better  I will call you with the results from today

## 2018-10-20 NOTE — Assessment & Plan Note (Signed)
Pain seems to be consistent with degenerative changes of the knees.  Acute pain with no inciting event.  No improvement with home modalities. -Naproxen and pennsaid  -Counseled on home exercise therapy and supportive care. -Left and right knee x-ray -If no improvement consider injections. -We will consider medial unloader brace on the right knee due to the thigh to calf ratio

## 2018-10-20 NOTE — Progress Notes (Signed)
Margaret Torres - 73 y.o. female MRN [id number]  Date of birth: 25-May-1946  SUBJECTIVE:  Including CC & ROS.  No chief complaint on file.   Margaret Torres is a 73 y.o. female that is presenting with bilateral knee pain.  The pain is acute on chronic in nature.  The pain initially started 7 years ago.  It is gotten worse recently.  She is having some falls that she associates with the right knee pain.  Has not had any prior surgeries.  Pain is moderate to severe and more constant in severity.  Is a throbbing type pain.  Reports having a history of arthritis of the knees.  Is intolerant to Tylenol.  Has taken ibuprofen with some improvement.   Review of Systems  Constitutional: Negative for fever.  HENT: Negative for congestion.   Respiratory: Negative for cough.   Cardiovascular: Negative for chest pain.  Gastrointestinal: Negative for abdominal pain.  Musculoskeletal: Positive for arthralgias and gait problem.  Skin: Negative for color change.  Neurological: Negative for weakness.  Hematological: Negative for adenopathy.  Psychiatric/Behavioral: Negative for agitation.    HISTORY: Past Medical, Surgical, Social, and Family History Reviewed & Updated per EMR.   Pertinent Historical Findings include:  Past Medical History:  Diagnosis Date  . Anxiety   . Cervical cancer ([place])   . Depression   . Hypertension     Past Surgical History:  Procedure Laterality Date  . ABDOMINAL HYSTERECTOMY    . WISDOM TOOTH EXTRACTION Bilateral     Allergies  Allergen Reactions  . Influenza Vaccines     Rash, tongue swelling    Family History  Problem Relation Age of Onset  . Cervical cancer Mother   . Hypertension Mother   . Hypertension Father   . Heart disease Brother   . Diabetes Brother   . Stroke Maternal Grandmother   . Cancer Other   . Diabetes Sister   . Stroke Sister   . Cancer Sister        bone cancer  . Breast cancer Sister   . Thyroid disease Neg Hx      Social  History   Socioeconomic History  . Marital status: Divorced    Spouse name: Not on file  . Number of children: Not on file  . Years of education: Not on file  . Highest education level: Not on file  Occupational History  . Not on file  Social Needs  . Financial resource strain: Not on file  . Food insecurity:    Worry: Not on file    Inability: Not on file  . Transportation needs:    Medical: Not on file    Non-medical: Not on file  Tobacco Use  . Smoking status: Never Smoker  . Smokeless tobacco: Never Used  Substance and Sexual Activity  . Alcohol use: No  . Drug use: No  . Sexual activity: Not Currently    Birth control/protection: Post-menopausal  Lifestyle  . Physical activity:    Days per week: Not on file    Minutes per session: Not on file  . Stress: Not on file  Relationships  . Social connections:    Talks on phone: Not on file    Gets together: Not on file    Attends religious service: Not on file    Active member of club or organization: Not on file    Attends meetings of clubs or organizations: Not on file    Relationship status: Not on file  .  Intimate partner violence:    Fear of current or ex partner: Not on file    Emotionally abused: Not on file    Physically abused: Not on file    Forced sexual activity: Not on file  Other Topics Concern  . Not on file  Social History Narrative  . Not on file     PHYSICAL EXAM:  VS: BP 140/90   Pulse 69   Resp 16   Ht 5\' 5"  (1.651 m)   Wt 159 lb (72.1 kg)   LMP 06/30/2013   SpO2 98%   BMI 26.46 kg/m  Physical Exam Gen: NAD, alert, cooperative with exam, well-appearing ENT: normal lips, normal nasal mucosa,  Eye: normal EOM, normal conjunctiva and lids CV:  no edema, +2 pedal pulses   Resp: no accessory muscle use, non-labored,  Skin: no rashes, no areas of induration  Neuro: normal tone, normal sensation to touch Psych:  normal insight, alert and oriented MSK:  Left and right knee:  No obvious  effusion  Normal ROM  Normal strength to resistance  Instability with valgus and varus stress testing  Negative McMurray's testing  No pain with patellar grind  Neurovascularly intact   Limited ultrasound: left and right knee:  Right knee:  Moderate effusion  Normal appearing quad and patellar tendon  Significant medial joint line degenerative changes   Left knee:  Moderate effusion  Normal appearing quad and patellar tendon  Moderate to severe medial joint line degenerative changes   Summary: left and right knee with degenerative changes and effusion   Ultrasound and interpretation by Clearance Coots, MD               ASSESSMENT & PLAN:   Primary osteoarthritis of both knees Pain seems to be consistent with degenerative changes of the knees.  Acute pain with no inciting event.  No improvement with home modalities. -Naproxen and pennsaid  -Counseled on home exercise therapy and supportive care. -Left and right knee x-ray -If no improvement consider injections. -We will consider medial unloader brace on the right knee due to the thigh to calf ratio

## 2018-10-23 ENCOUNTER — Telehealth: Payer: Self-pay | Admitting: Family Medicine

## 2018-10-23 NOTE — Telephone Encounter (Signed)
Left VM for patient. If she calls back please have her speak with a nurse/CMA and inform that each knee xray shows significant arthritis. The [facility] can report results to patient.   If any questions then please take the best time and phone number to call and I will try to call her back.   Rosemarie Ax, MD [facility] 10/23/2018, 11:48 AM

## 2018-10-23 NOTE — Telephone Encounter (Signed)
Pt. Given results. Verbalizes understanding.

## 2018-11-01 ENCOUNTER — Other Ambulatory Visit: Payer: Self-pay

## 2018-11-01 ENCOUNTER — Ambulatory Visit (INDEPENDENT_AMBULATORY_CARE_PROVIDER_SITE_OTHER): Payer: Medicare HMO | Admitting: Family Medicine

## 2018-11-01 ENCOUNTER — Encounter: Payer: Self-pay | Admitting: Family Medicine

## 2018-11-01 VITALS — BP 121/81 | HR 81 | Temp 98.4°F | Resp 16 | Ht 65.0 in | Wt 162.1 lb

## 2018-11-01 DIAGNOSIS — J309 Allergic rhinitis, unspecified: Secondary | ICD-10-CM | POA: Diagnosis not present

## 2018-11-01 DIAGNOSIS — K219 Gastro-esophageal reflux disease without esophagitis: Secondary | ICD-10-CM | POA: Diagnosis not present

## 2018-11-01 MED ORDER — CETIRIZINE HCL 10 MG PO TABS: 10.0000 mg | ORAL_TABLET | Freq: Every day | ORAL | 11 refills | Status: DC

## 2018-11-01 NOTE — Assessment & Plan Note (Signed)
Improved since adding Protonix daily.  No changes at this time

## 2018-11-01 NOTE — Progress Notes (Signed)
   Subjective:    [name], female    DOB: 1945/11/15, 73 y.o.   MRN: [id number]  HPI Abd pain- pt was seen on 1/2 and started on Protonix for sour brash, reflux, and abd pain.  'i'm doing much better than the last time you saw me'.  Denies abd pain, N/V, sour brash.  Taking pill daily.  Allergic rhinitis- pt still complains about phlegm in her throat.  Using Flonase.  No longer on Zyrtec due to cost.  Review of Systems For ROS see HPI     Objective:   Physical Exam Vitals signs reviewed.  Constitutional:      General: She is not in acute distress.    Appearance: She is well-developed.  HENT:     Head: Normocephalic and atraumatic.     Mouth/Throat:     Comments: Copious PND Eyes:     Conjunctiva/sclera: Conjunctivae normal.     Pupils: Pupils are equal, round, and reactive to light.  Neck:     Musculoskeletal: Normal range of motion and neck supple.     Thyroid: No thyromegaly.  Cardiovascular:     Rate and Rhythm: Normal rate and regular rhythm.     Heart sounds: Normal heart sounds. No murmur.  Pulmonary:     Effort: Pulmonary effort is normal. No respiratory distress.     Breath sounds: Normal breath sounds.  Abdominal:     General: There is no distension.     Palpations: Abdomen is soft.     Tenderness: There is no abdominal tenderness.  Lymphadenopathy:     Cervical: No cervical adenopathy.  Skin:    General: Skin is warm and dry.  Neurological:     Mental Status: She is alert and oriented to person, place, and time.  Psychiatric:        Behavior: Behavior normal.           Assessment & Plan:

## 2018-11-01 NOTE — Patient Instructions (Signed)
Schedule your complete physical in 3-4 months Continue the protonix daily Continue the Flonase- 2 sprays each nostril daily Drink plenty of fluids START the daily Cetirizine (Zyrtec) Call with any questions or concerns Happy Early Birthday!

## 2018-11-01 NOTE — Assessment & Plan Note (Signed)
Deteriorated.  Stressed need for antihistamine use in addition to [facility].  Prescription sent as pt is not able to afford OTC.  Pt expressed understanding and is in agreement w/ plan.

## 2018-11-03 DIAGNOSIS — M170 Bilateral primary osteoarthritis of knee: Secondary | ICD-10-CM | POA: Diagnosis not present

## 2018-11-13 ENCOUNTER — Other Ambulatory Visit: Payer: Self-pay | Admitting: Family Medicine

## 2018-11-14 NOTE — Telephone Encounter (Signed)
Medication was sent in to the pharmacy. I called patient to let her know. Stated verbal understanding and was appreciative of the call.

## 2018-11-14 NOTE — Telephone Encounter (Signed)
Last OV 11/01/18 Clonazepam last filled 10/16/18 #30 with 0  SIG written as BID PRN.

## 2018-11-14 NOTE — Telephone Encounter (Signed)
Pt called to check status. Pt states that she is completely out.

## 2018-11-15 ENCOUNTER — Other Ambulatory Visit: Payer: Self-pay | Admitting: Family Medicine

## 2018-12-15 ENCOUNTER — Ambulatory Visit (INDEPENDENT_AMBULATORY_CARE_PROVIDER_SITE_OTHER): Payer: Medicare HMO | Admitting: Family Medicine

## 2018-12-15 ENCOUNTER — Encounter: Payer: Self-pay | Admitting: Family Medicine

## 2018-12-15 VITALS — BP 124/76 | HR 76 | Resp 16 | Ht 65.0 in

## 2018-12-15 DIAGNOSIS — M170 Bilateral primary osteoarthritis of knee: Secondary | ICD-10-CM

## 2018-12-15 NOTE — Progress Notes (Signed)
Erroneous

## 2018-12-25 ENCOUNTER — Other Ambulatory Visit: Payer: Self-pay | Admitting: Family Medicine

## 2018-12-25 NOTE — Telephone Encounter (Signed)
Copied from [phone]. Topic: Quick Communication - Rx Refill/Question >> Dec 25, 2018 10:45 AM Ahmed Prima L wrote: Medication: amLODipine (NORVASC) 10 MG tablet - she said she can never get the bottom number down below 85 when she takes her blood pressure. She said she thinks if she tries a different pharmacy , they may use a different company, so therefore she thinks she can get her bottom number down lower.   Has the patient contacted their pharmacy? Yes - no refills - wants to switch pharmacies  (Agent: If no, request that the patient contact the pharmacy for the refill.) (Agent: If yes, when and what did the pharmacy advise?)  Preferred Pharmacy (with phone number or street name):  [id number] [address], [place] [place] [address] Phone: [phone] Fax: [phone]    Agent: Please be advised that RX refills may take up to 3 business days. We ask that you follow-up with your pharmacy.

## 2018-12-26 MED ORDER — AMLODIPINE BESYLATE 10 MG PO TABS: 10.0000 mg | ORAL_TABLET | Freq: Every day | ORAL | 0 refills | Status: DC

## 2019-02-19 ENCOUNTER — Telehealth: Payer: Self-pay | Admitting: Family Medicine

## 2019-02-19 MED ORDER — CETIRIZINE HCL 10 MG PO TABS: 10.0000 mg | ORAL_TABLET | Freq: Every day | ORAL | 11 refills | Status: DC

## 2019-02-19 NOTE — Telephone Encounter (Signed)
Medication filled to pharmacy as requested.   

## 2019-02-19 NOTE — Telephone Encounter (Signed)
Copied from [place] [phone]. Topic: Quick Communication - See Telephone Encounter >> Feb 19, 2019 10:33 AM Loma [place] wrote: CRM for notification. See Telephone encounter for: 02/19/19.cetirizine (ZYRTEC) 10 MG tablet,[id number] [address], [place] [address] [phone] (Phone) [phone] (Fax) Pt [facility] so she does not have to go in.

## 2019-02-25 IMAGING — US US THYROID
1 series · 13 of 25 positions shown · non-contrast
Comparison: Prior thyroid ultrasound 08/10/2016

CLINICAL DATA: Prior ultrasound follow-up. 72-year-old female with
bilateral thyroid nodules

EXAM:
THYROID ULTRASOUND
TECHNIQUE: Ultrasound examination of the thyroid gland and adjacent soft
tissues was performed.

[Series 1: us thyroid · 0.07mm/px · 13 of 51 slices shown]
[im 1/51]
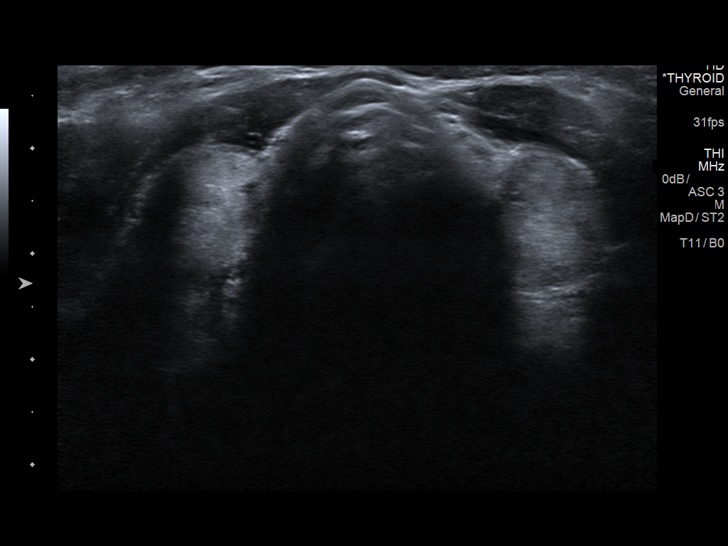
[im 5/51]
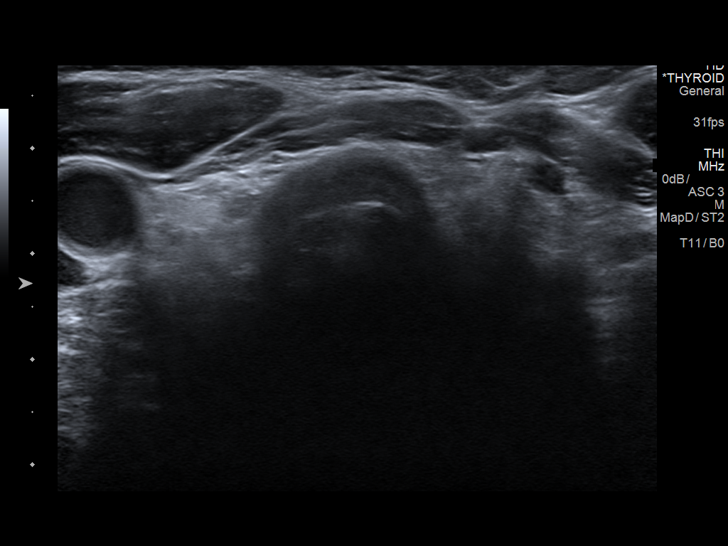
[im 9/51]
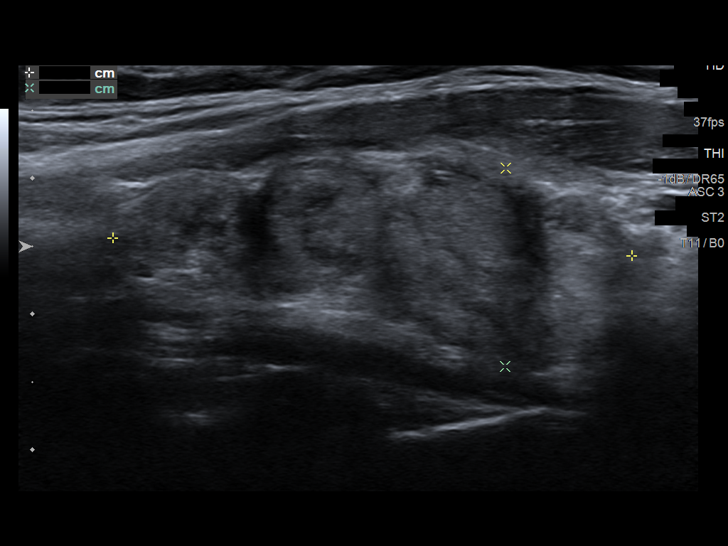
[im 13/51]
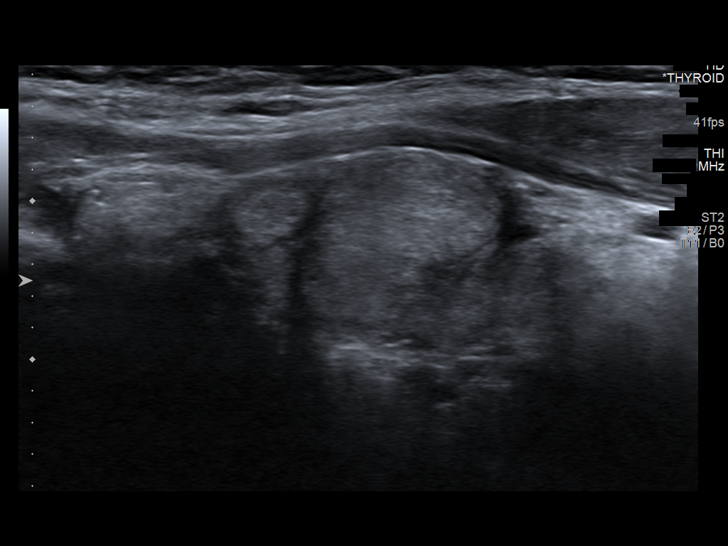
[im 17/51]
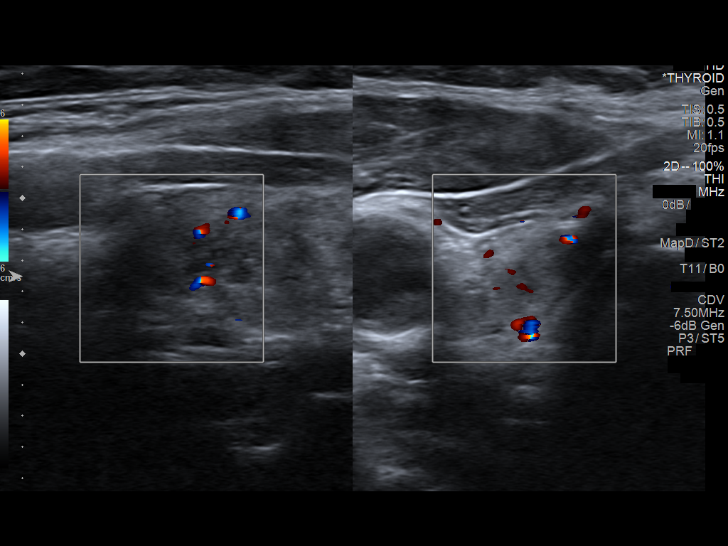
[im 21/51]
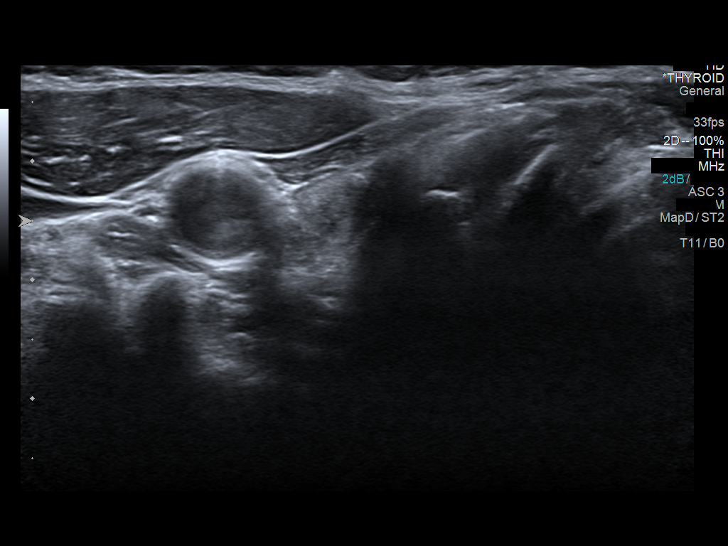
[im 26/51]
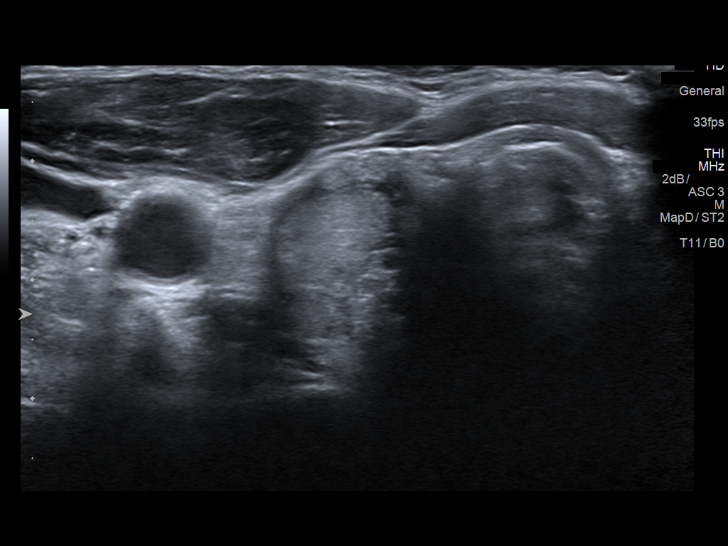
[im 30/51]
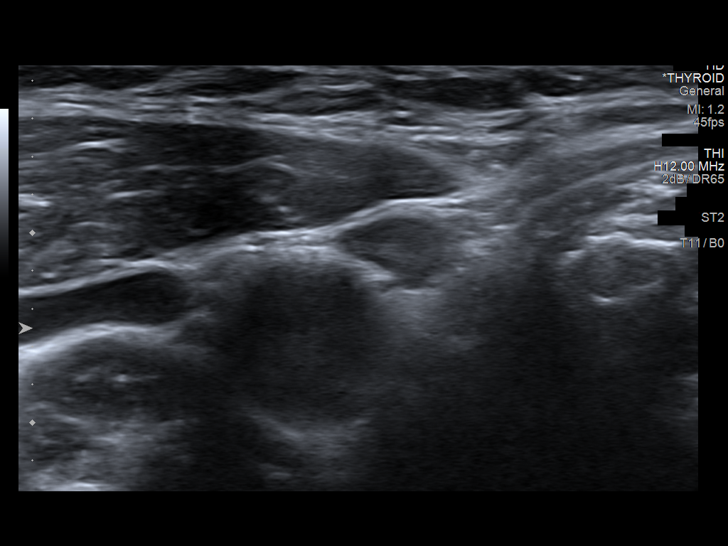
[im 34/51]
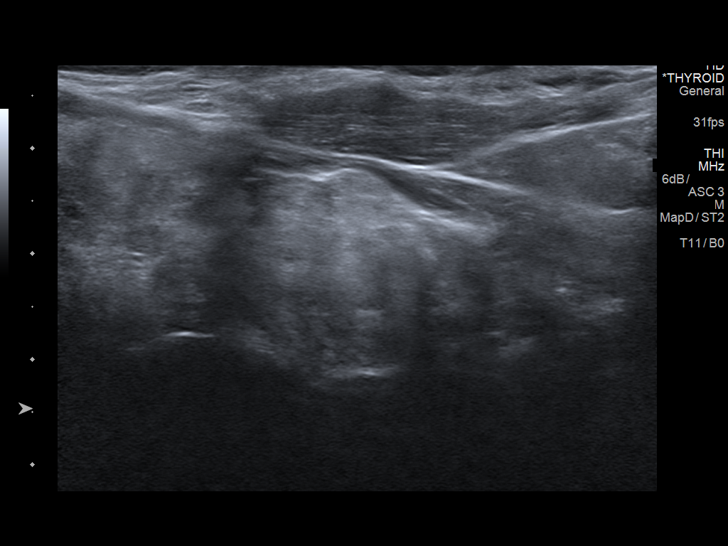
[im 38/51]
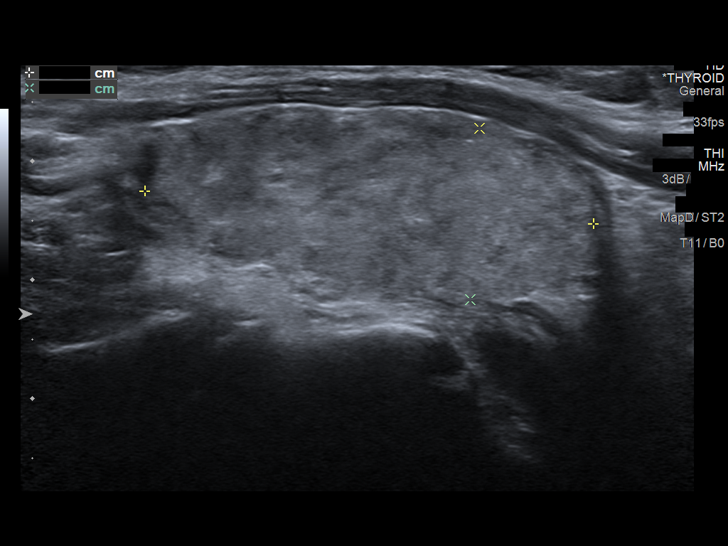
[im 42/51]
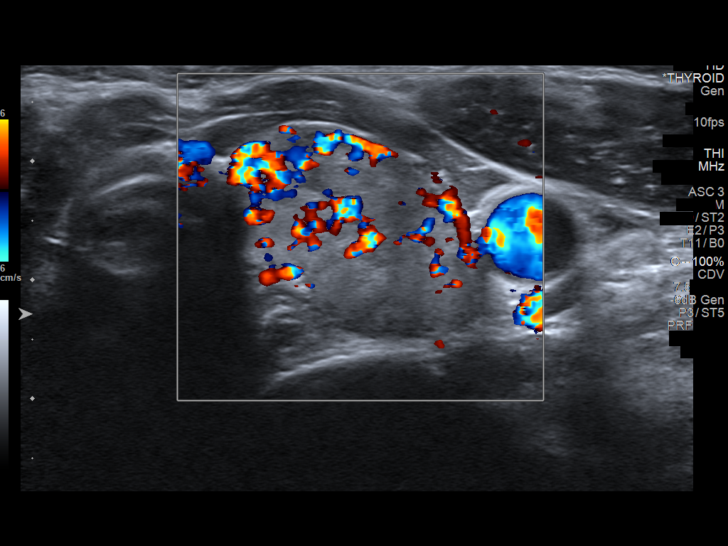
[im 46/51]
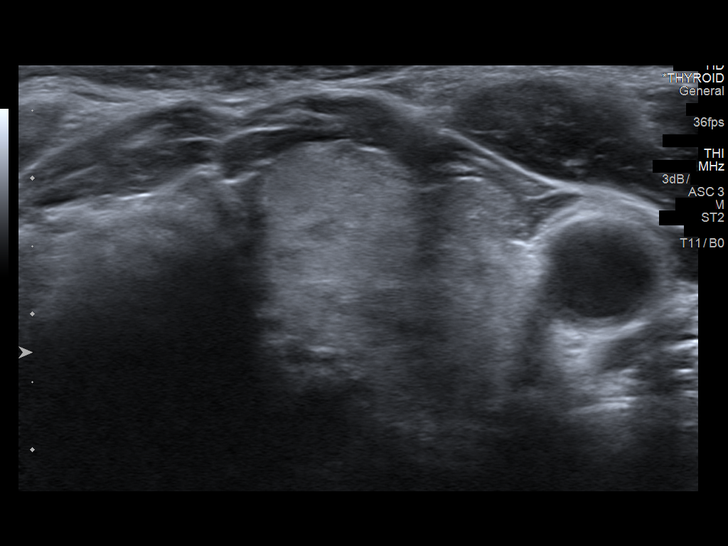
[im 51/51]
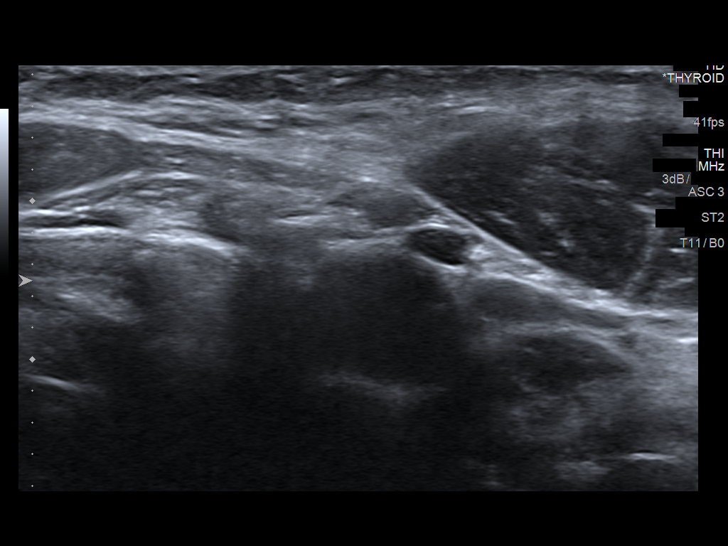

[13 of 25 positions shown; findings below may reference images not displayed]

FINDINGS: Parenchymal Echotexture: Markedly heterogenous

Isthmus: 0.1 cm

Right lobe: 3.8 x 1.5 x 1.8 cm

Left lobe: 4.5 x 2.0 x 2.1 cm

_________________________________________________________

Estimated total number of nodules >/= 1 cm: 2

Number of spongiform nodules >/=  2 cm not described below (TR1): 0

Number of mixed cystic and solid nodules >/= 1.5 cm not described
below (TR2): 0

_________________________________________________________

Nodule # 2:

Prior biopsy: No

Location: Right; Mid

Maximum size: 3.0 cm; Other 2 dimensions: 1.5 x 1.8 cm, previously,
3.0 x 1.4 x 1.8 cm

Composition: solid/almost completely solid (2)

Echogenicity: isoechoic (1)

Shape: not taller-than-wide (0)

Margins: ill-defined (0)

Echogenic foci: none (0)

ACR TI-RADS total points: 3.

ACR TI-RADS risk category:  TR3 (3 points).

Significant change in size (>/= 20% in two dimensions and minimal
increase of 2 mm): No

Change in features: No

Change in ACR TI-RADS risk category: No

ACR TI-RADS recommendations:

**Given size (>/= 2.5 cm) and appearance, fine needle aspiration of
this mildly suspicious nodule should be considered based on TI-RADS
criteria.

_________________________________________________________

Nodule # 1:

Prior biopsy: No

Location: Left; Mid

Maximum size: 3.8 cm; Other 2 dimensions: 1.5 x 1.8 cm, previously,
3.8 x 1.8 x 2.2 cm

Composition: solid/almost completely solid (2)

Echogenicity: isoechoic (1)

Shape: not taller-than-wide (0)

Margins: ill-defined (0)

Echogenic foci: none (0)

ACR TI-RADS total points: 3.

ACR TI-RADS risk category:  TR3 (3 points).

Significant change in size (>/= 20% in two dimensions and minimal
increase of 2 mm): Yes

Change in features: No

Change in ACR TI-RADS risk category: No

ACR TI-RADS recommendations:

Interval involution is highly suggestive of benignity.

_________________________________________________________

Subcentimeter nodule in the superior right gland has also decreased
in size compared to prior imaging consistent with a benign lesion.
IMPRESSION: 1. Stable TI-RADS category 3 nodule (Right #2) in the right mid to
lower gland meets criteria for annual follow-up ultrasound until 5
year stability has been documented. The present examination confirms
1.5 years of stability.
2. Decreasing size of the large thyroid nodule occupying the
majority of the left gland. Interval involution is highly suggestive
of benignity.

The above is in keeping with the ACR TI-RADS recommendations - [HOSPITAL] 7921;[DATE].

## 2019-03-06 ENCOUNTER — Other Ambulatory Visit: Payer: Self-pay | Admitting: Family Medicine

## 2019-03-06 NOTE — Telephone Encounter (Signed)
Last refill:11/14/18 #30, 3 Last OV:10/12/18

## 2019-03-07 NOTE — Telephone Encounter (Signed)
Pt is calling to f/up on this request.

## 2019-03-07 NOTE — Telephone Encounter (Signed)
Patient is calling back to check on status. Patient is stating she will have a seizure if she does not get medication.  I advised patient med refills does take 48-72 hours. Patient is worried she will have seizure if she does not get medication by today.

## 2019-03-07 NOTE — Telephone Encounter (Signed)
Please advise 

## 2019-03-07 NOTE — Telephone Encounter (Signed)
Pt called again asking for a refill

## 2019-03-09 ENCOUNTER — Encounter: Payer: Medicare HMO | Admitting: Family Medicine

## 2019-03-18 ENCOUNTER — Other Ambulatory Visit: Payer: Self-pay | Admitting: Family Medicine

## 2019-04-25 ENCOUNTER — Other Ambulatory Visit: Payer: Self-pay | Admitting: Family Medicine

## 2019-04-26 ENCOUNTER — Ambulatory Visit: Payer: Self-pay | Admitting: Family Medicine

## 2019-04-26 NOTE — Telephone Encounter (Signed)
Agree w/ advice given.  I prefer pt go to ER today but since she refused multiple times we will see her on Monday

## 2019-04-26 NOTE — Telephone Encounter (Signed)
Pt. Reports for 2 weeks has had her heart pounding at intervals, which last 15-20 minutes. Her hands will feels "prickly." Pulse has been "running in the 40's x 2 weeks." Chest " feels uncomfortable, not hurting too bad." This morning BP 145/85  Pulse 41. Instructed to go to ED for evaluation. Refuses because of corona virus. States "I don't feel weak, I went fishing a couple of days ago. I think if I go up and down the stairs it will get my heart rate up." Reinforced that she needs to go to ED. Please advise pt.  Reason for Disposition . Heart beating very slowly (e.g., < 50 / minute)  (Exception: athlete)  Answer Assessment - Initial Assessment Questions 1. DESCRIPTION: "Please describe your heart rate or heart beat that you are having" (e.g., fast/slow, regular/irregular, skipped or extra beats, "palpitations")     Heart feels like it was pounding 2. ONSET: "When did it start?" (Minutes, hours or days)      2 weeks 3. DURATION: "How long does it last" (e.g., seconds, minutes, hours)     Lasts a few minutes - 15-20 minutes 4. PATTERN "Does it come and go, or has it been constant since it started?"  "Does it get worse with exertion?"   "Are you feeling it now?"     Comes and goes 5. TAP: "Using your hand, can you tap out what you are feeling on a chair or table in front of you, so that I can hear?" (Note: not all patients can do this)       No 6. HEART RATE: "Can you tell me your heart rate?" "How many beats in 15 seconds?"  (Note: not all patients can do this)       With home monitor this morning - BP 145/85  Pulse 41 7. RECURRENT SYMPTOM: "Have you ever had this before?" If so, ask: "When was the last time?" and "What happened that time?"      For 2 weeks 8. CAUSE: "What do you think is causing the palpitations?"     Unsure 9. CARDIAC HISTORY: "Do you have any history of heart disease?" (e.g., heart attack, angina, bypass surgery, angioplasty, arrhythmia)      No 10. OTHER SYMPTOMS: "Do  you have any other symptoms?" (e.g., dizziness, chest pain, sweating, difficulty breathing)       Chest feels uncomfortable 11. PREGNANCY: "Is there any chance you are pregnant?" "When was your last menstrual period?"       No  Protocols used: HEART RATE AND HEARTBEAT QUESTIONS-A-AH

## 2019-04-26 NOTE — Telephone Encounter (Signed)
Called and spoke with pt. She advised that when she took a tums her chest symptoms went away. Stated she climbed the stairs 5 times and began to feel better. Pt stated that she is "afraid to have a stroke at home alone" pt was advised that if she continues to have chest pains she MUST go in to be evaluated at the ED as I do not have any capabilities to help her in our office. Pt was scheduled an appt with PCP on Monday afternoon to check her BP cuff accuracy. Pt was again advised that if symptoms changed or worsened before that appt she MUST go to ED for evaluation. Pt stated an understanding.

## 2019-04-30 ENCOUNTER — Ambulatory Visit (INDEPENDENT_AMBULATORY_CARE_PROVIDER_SITE_OTHER): Payer: Medicare HMO | Admitting: Family Medicine

## 2019-04-30 ENCOUNTER — Encounter: Payer: Self-pay | Admitting: Family Medicine

## 2019-04-30 ENCOUNTER — Other Ambulatory Visit: Payer: Self-pay

## 2019-04-30 VITALS — BP 133/78 | HR 74 | Temp 97.8°F | Resp 16 | Ht 65.0 in | Wt 153.4 lb

## 2019-04-30 DIAGNOSIS — R350 Frequency of micturition: Secondary | ICD-10-CM

## 2019-04-30 DIAGNOSIS — I10 Essential (primary) hypertension: Secondary | ICD-10-CM

## 2019-04-30 DIAGNOSIS — F419 Anxiety disorder, unspecified: Secondary | ICD-10-CM

## 2019-04-30 DIAGNOSIS — J309 Allergic rhinitis, unspecified: Secondary | ICD-10-CM | POA: Diagnosis not present

## 2019-04-30 DIAGNOSIS — R319 Hematuria, unspecified: Secondary | ICD-10-CM | POA: Diagnosis not present

## 2019-04-30 DIAGNOSIS — F329 Major depressive disorder, single episode, unspecified: Secondary | ICD-10-CM | POA: Diagnosis not present

## 2019-04-30 DIAGNOSIS — F32A Depression, unspecified: Secondary | ICD-10-CM

## 2019-04-30 DIAGNOSIS — R002 Palpitations: Secondary | ICD-10-CM

## 2019-04-30 LAB — POCT URINALYSIS DIPSTICK
Bilirubin, UA: NEGATIVE
Glucose, UA: NEGATIVE
Ketones, UA: NEGATIVE
Leukocytes, UA: NEGATIVE
Nitrite, UA: NEGATIVE
Protein, UA: NEGATIVE
Spec Grav, UA: 1.015 (ref 1.010–1.025)
Urobilinogen, UA: 0.2 E.U./dL
pH, UA: 6 (ref 5.0–8.0)

## 2019-04-30 MED ORDER — AMLODIPINE BESYLATE 10 MG PO TABS: 10.0000 mg | ORAL_TABLET | Freq: Every day | ORAL | 1 refills | Status: DC

## 2019-04-30 MED ORDER — LEVOCETIRIZINE DIHYDROCHLORIDE 5 MG PO TABS: 5.0000 mg | ORAL_TABLET | Freq: Every evening | ORAL | 6 refills | Status: DC

## 2019-04-30 NOTE — Assessment & Plan Note (Signed)
Ongoing issue for pt.  She again refuses to try SSRI- Zoloft caused nausea.  She is not interested in trying a new medication until she takes the Xyzal b/c her friend told her that it relaxes her and helps her sleep.  Will continue to follow closely.

## 2019-04-30 NOTE — Patient Instructions (Addendum)
Follow up as scheduled in August We'll notify you of your lab results and make any changes if needed START the Levocetirizine for the allergies at night Call with any questions or concerns Hang in there!  Stay Safe!!!

## 2019-04-30 NOTE — Assessment & Plan Note (Signed)
Chronic problem.  Adequate control today.  Asymptomatic today but reports palpitations at times.  Suspect this is due to her undertreated anxiety/depression.  Check labs.  No anticipated med changes.  Will follow.

## 2019-04-30 NOTE — Progress Notes (Signed)
   Subjective:    [name], female    DOB: 01-Jun-1946, 73 y.o.   MRN: [id number]  HPI HTN- chronic problem.  Adequate control today.  On Amlodipine 10mg  daily.  [facility]/c [facility] finds [facility] more effective  Urinary frequency/urgency- pt reports suprapubic pressure and dysuria 'on and off' for 2 weeks.  Seasonal allergies- pt reports itchy eyes, itchy ears.  Not able to tolerate Zyrtec b/c 'it has HCl and I can't tolerate HCl.  It makes me jittery'.  Would like to switch to Xyzal  Depression- ongoing issue for pt.  Worsening due to COVID isolation.  'I get so lonely'.  Denies SI/HI.  Wants to try taking the Xyzal at night to improve sleep prior to starting additional medication.   Review of Systems For ROS see HPI     Objective:   Physical Exam Vitals signs reviewed.  Constitutional:      General: She is not in acute distress.    Appearance: She is well-developed.  HENT:     Head: Normocephalic and atraumatic.  Eyes:     Conjunctiva/sclera: Conjunctivae normal.     Pupils: Pupils are equal, round, and reactive to light.  Neck:     Musculoskeletal: Normal range of motion and neck supple.     Thyroid: Thyroid mass (R sided thyroid nodule) present. No thyromegaly.  Cardiovascular:     Rate and Rhythm: Normal rate and regular rhythm.     Heart sounds: Normal heart sounds. No murmur.  Pulmonary:     Effort: Pulmonary effort is normal. No respiratory distress.     Breath sounds: Normal breath sounds.  Abdominal:     General: There is no distension.     Palpations: Abdomen is soft.     Tenderness: There is no abdominal tenderness.  Lymphadenopathy:     Cervical: No cervical adenopathy.  Skin:    General: Skin is warm and dry.  Neurological:     Mental Status: She is alert and oriented to person, place, and time.  Psychiatric:        Behavior: Behavior normal.           Assessment & Plan:  Urinary  frequency- new.  Check UA and send for cx if needed.  abx if UA is highly suspicious.  Pt expressed understanding and is in agreement w/ plan.

## 2019-04-30 NOTE — Assessment & Plan Note (Signed)
Deteriorated.  Pt reports Zyrtec makes her 'jittery b/c it has HCl'.  Will switch to Xyzal as this was recommended by a friend.  She was told that this also helps w/ sleep.  Continue Flonase in addition to Xyzal.  Will follow.

## 2019-05-01 LAB — CBC WITH DIFFERENTIAL/PLATELET
Basophils Absolute: 0.1 10*3/uL (ref 0.0–0.1)
Basophils Relative: 1 % (ref 0.0–3.0)
Eosinophils Absolute: 0.2 10*3/uL (ref 0.0–0.7)
Eosinophils Relative: 3.1 % (ref 0.0–5.0)
HCT: 37.1 % (ref 36.0–46.0)
Hemoglobin: 12.1 g/dL (ref 12.0–15.0)
Lymphocytes Relative: 34.5 % (ref 12.0–46.0)
Lymphs Abs: 2.4 10*3/uL (ref 0.7–4.0)
MCHC: 32.5 g/dL (ref 30.0–36.0)
MCV: 93 fl (ref 78.0–100.0)
Monocytes Absolute: 0.6 10*3/uL (ref 0.1–1.0)
Monocytes Relative: 7.9 % (ref 3.0–12.0)
Neutro Abs: 3.8 10*3/uL (ref 1.4–7.7)
Neutrophils Relative %: 53.5 % (ref 43.0–77.0)
Platelets: 279 10*3/uL (ref 150.0–400.0)
RBC: 3.99 Mil/uL (ref 3.87–5.11)
RDW: 13.3 % (ref 11.5–15.5)
WBC: 7.1 10*3/uL (ref 4.0–10.5)

## 2019-05-01 LAB — BASIC METABOLIC PANEL
BUN: 16 mg/dL (ref 6–23)
CO2: 23 mEq/L (ref 19–32)
Calcium: 9.2 mg/dL (ref 8.4–10.5)
Chloride: 106 mEq/L (ref 96–112)
Creatinine, Ser: 0.98 mg/dL (ref 0.40–1.20)
GFR: 67.04 mL/min (ref >60.00)
Glucose, Bld: 85 mg/dL (ref 70–99)
Potassium: 3.8 mEq/L (ref 3.5–5.1)
Sodium: 138 mEq/L (ref 135–145)

## 2019-05-01 LAB — LIPID PANEL
Cholesterol: 169 mg/dL (ref 0–200)
HDL: 47.4 mg/dL (ref >39.00)
LDL Cholesterol: 106 mg/dL — ABNORMAL HIGH (ref 0–99)
NonHDL: 121.75
Total CHOL/HDL Ratio: 4
Triglycerides: 77 mg/dL (ref 0.0–149.0)
VLDL: 15.4 mg/dL (ref 0.0–40.0)

## 2019-05-01 LAB — HEPATIC FUNCTION PANEL
ALT: 24 U/L (ref 0–35)
AST: 22 U/L (ref 0–37)
Albumin: 4.5 g/dL (ref 3.5–5.2)
Alkaline Phosphatase: 61 U/L (ref 39–117)
Bilirubin, Direct: 0.1 mg/dL (ref 0.0–0.3)
Total Bilirubin: 0.6 mg/dL (ref 0.2–1.2)
Total Protein: 6.8 g/dL (ref 6.0–8.3)

## 2019-05-01 LAB — TSH: TSH: 1.47 u[IU]/mL (ref 0.35–4.50)

## 2019-05-02 LAB — URINE CULTURE
MICRO NUMBER:: 683796
SPECIMEN QUALITY:: ADEQUATE

## 2019-05-03 ENCOUNTER — Telehealth: Payer: Self-pay | Admitting: *Deleted

## 2019-05-03 NOTE — Telephone Encounter (Signed)
Patient called and stated that she had seen Dr. Birdie Riddle earlier in the week.  She states that she was told to call if she was still having trouble sleeping. She is not sleeping well and asking if something could be called in.

## 2019-05-03 NOTE — Telephone Encounter (Signed)
Can start Nortriptyline 25mg  nightly, #30, 1 refill

## 2019-05-03 NOTE — Telephone Encounter (Signed)
Please advise 

## 2019-05-04 MED ORDER — NORTRIPTYLINE HCL 25 MG PO CAPS: 25.0000 mg | ORAL_CAPSULE | Freq: Every day | ORAL | 1 refills | Status: DC

## 2019-05-04 NOTE — Telephone Encounter (Signed)
Called pt and advised Rx was sent to pharmacy this morning.

## 2019-05-04 NOTE — Addendum Note (Signed)
Addended by: Davis Gourd on: 05/04/2019 08:41 AM   Modules accepted: Orders

## 2019-05-08 ENCOUNTER — Telehealth: Payer: Self-pay | Admitting: General Practice

## 2019-05-08 NOTE — Telephone Encounter (Signed)
PA for nortriptyline was approved until 10/11/2019

## 2019-05-14 ENCOUNTER — Telehealth: Payer: Self-pay | Admitting: *Deleted

## 2019-05-14 NOTE — Telephone Encounter (Signed)
We can certainly try and tell pt this information but I suspect this will be a difficult sell (unless the pharmacy student did a really good job explaining things).  You can tell her that the pharmacist and I agree that this would be in her best interest.

## 2019-05-14 NOTE — Telephone Encounter (Signed)
Please advise 

## 2019-05-14 NOTE — Telephone Encounter (Signed)
Unfortunately I am not aware of the chemical makeup of these medications (HCl)- just their names and what they do.  We can ask the pharmacy if there are any formulations of Nortriptyline or Amitriptyline that do not have HCl in the name and go from there.

## 2019-05-14 NOTE — Telephone Encounter (Signed)
Patient called stating that she picked up her Nortriptyline medication and the bottle says Nortriptyline HCL and she says that she cannot take anything that says HCL.  She states that it makes her hyper and urinate all the time.  She would like for Dr. Birdie Riddle to advise what she should do.

## 2019-05-14 NOTE — Telephone Encounter (Signed)
Spoke with pharmacy and they advised that all medications have HCL which is a salt base. He also advised another of her medications has the HCL in it and it just is not printed on the label. She also spoke to a pharmacy student and the pharmacist overheard. He feels that pt is confusing this with HCTZ.

## 2019-05-14 NOTE — Telephone Encounter (Signed)
Called and advised pt, she stated she would try the medication. She is concerned that she would become addicted like she is to the Clonazepam. Her concern comes from the label telling her to not stop the medication directly. I advised pt that this warning label is there because no mood enhancers should just be stopped immediately that they should be weaned off. Pt stated an understanding and advised she would try the medication.

## 2019-05-15 ENCOUNTER — Other Ambulatory Visit: Payer: Self-pay | Admitting: Family Medicine

## 2019-05-15 MED ORDER — CLONAZEPAM 0.5 MG PO TABS: 0.5000 mg | ORAL_TABLET | Freq: Two times a day (BID) | ORAL | 0 refills | Status: DC | PRN

## 2019-05-15 NOTE — Telephone Encounter (Signed)
Pt called in stating that she changed pharmacies she would like her [facility] to [facility] [address]. This will be her preferred pharmacy moving forward.

## 2019-05-15 NOTE — Telephone Encounter (Signed)
Last OV 04/30/19 Clonazepam last filled 03/07/19 #30 with 3

## 2019-05-25 ENCOUNTER — Encounter: Payer: Self-pay | Admitting: Family Medicine

## 2019-05-25 ENCOUNTER — Other Ambulatory Visit: Payer: Self-pay

## 2019-05-25 ENCOUNTER — Ambulatory Visit (INDEPENDENT_AMBULATORY_CARE_PROVIDER_SITE_OTHER): Payer: Medicare HMO | Admitting: Family Medicine

## 2019-05-25 VITALS — BP 136/84 | HR 78 | Temp 97.6°F | Resp 17 | Ht 65.0 in | Wt 154.4 lb

## 2019-05-25 DIAGNOSIS — Z0001 Encounter for general adult medical examination with abnormal findings: Secondary | ICD-10-CM | POA: Diagnosis not present

## 2019-05-25 DIAGNOSIS — F329 Major depressive disorder, single episode, unspecified: Secondary | ICD-10-CM

## 2019-05-25 DIAGNOSIS — F419 Anxiety disorder, unspecified: Secondary | ICD-10-CM

## 2019-05-25 DIAGNOSIS — F32A Depression, unspecified: Secondary | ICD-10-CM

## 2019-05-25 DIAGNOSIS — Z0000 Encounter for general adult medical examination without abnormal findings: Secondary | ICD-10-CM

## 2019-05-25 NOTE — Assessment & Plan Note (Signed)
Ongoing issue.  Moderate to severe.  No SI/HI but very lonely and very sad.  She has tried many medications in the past and according to pt all were either ineffective or had side effects.  She was prescribed Nortriptyline earlier this summer but has yet to take it.  She is fearful of becoming addicted b/c she read that you need to wean this.  Stressed that weaning does not equate addiction.  After a 20 minute discussion, pt is agreeable to try medication.  Will follow.

## 2019-05-25 NOTE — Progress Notes (Signed)
   Subjective:    [name], female    DOB: 03-Feb-1946, 73 y.o.   MRN: [id number]  HPI UTD on colonoscopy, mammo.  Due for pneumonia vaccine- declines.   Review of Systems Patient reports no vision/ hearing changes, adenopathy,fever, weight change,  persistant/recurrent hoarseness , swallowing issues, chest pain, palpitations, edema, persistant/recurrent cough, hemoptysis, dyspnea (rest/exertional/paroxysmal nocturnal), gastrointestinal bleeding (melena, rectal bleeding), abdominal pain, significant heartburn, bowel changes, GU symptoms (dysuria, hematuria, incontinence), Gyn symptoms (abnormal  bleeding, pain),  syncope, focal weakness, memory loss, numbness & tingling, skin/hair/nail changes, abnormal bruising or bleeding.   + depression- pt is very lonely.  'I don't see nobody'.  All of her brothers and sisters have died.  Daughter wants pt to come visit.  'I would just love to have some company'  Pt is not taking the Nortriptyline- 'I worry about being addicted to it'.    Objective:   Physical Exam General Appearance:    Alert, cooperative, no distress, appears stated age  Head:    Normocephalic, without obvious abnormality, atraumatic  Eyes:    PERRL, conjunctiva/corneas clear, EOM's intact, fundi    benign, both eyes  Ears:    Normal TM's and external ear canals, both ears  Nose:   Deferred due to COVID  Throat:   Neck:   Supple, symmetrical, trachea midline, no adenopathy;    Thyroid: multiple nodules  Back:     Symmetric, no curvature, ROM normal, no CVA tenderness  Lungs:     Clear to auscultation bilaterally, respirations unlabored  Chest Wall:    No tenderness or deformity   Heart:    Regular rate and rhythm, S1 and S2 normal, no murmur, rub   or gallop  Breast Exam:    Deferred to mammo  Abdomen:     Soft, non-tender, bowel sounds active all four quadrants,    no masses, no organomegaly  Genitalia:    Deferred  Rectal:    Extremities:   Extremities  normal, atraumatic, no cyanosis or edema  Pulses:   2+ and symmetric all extremities  Skin:   Skin color, texture, turgor normal, no rashes or lesions  Lymph nodes:   Cervical, supraclavicular, and axillary nodes normal  Neurologic:   CNII-XII intact, normal strength, sensation and reflexes    throughout          Assessment & Plan:

## 2019-05-25 NOTE — Patient Instructions (Signed)
Follow up in 6 months to recheck cholesterol Your labs all look great!  No need to repeat at this time START the Nortriptyline nightly- we can increase this if needed Call with any questions or concerns Stay Safe!

## 2019-05-25 NOTE — Assessment & Plan Note (Signed)
Unchanged from previous.  UTD on colonoscopy.  Declines pneumonia shot.  UTD on mammogram.  Reviewed recent labs- WNL.  Anticipatory guidance provided.

## 2019-05-30 ENCOUNTER — Other Ambulatory Visit: Payer: Self-pay | Admitting: Physician Assistant

## 2019-05-30 NOTE — Telephone Encounter (Signed)
Last refill: 8.4.20 #30, 0 Last OV: 8.14.20 dx. CPE

## 2019-05-30 NOTE — Telephone Encounter (Signed)
Pt states that she takes the Klonopin 2X a day. [facility] Please advise

## 2019-06-11 ENCOUNTER — Other Ambulatory Visit: Payer: Self-pay | Admitting: *Deleted

## 2019-06-11 MED ORDER — MECLIZINE HCL 25 MG PO TABS: 25.0000 mg | ORAL_TABLET | Freq: Three times a day (TID) | ORAL | 0 refills | Status: DC | PRN

## 2019-06-11 NOTE — Addendum Note (Signed)
Addended by: Davis Gourd on: 06/11/2019 11:47 AM   Modules accepted: Orders

## 2019-06-11 NOTE — Telephone Encounter (Signed)
Patient called and said that she had a bout of vertigo last year and it is bothering her again and she wants to know if Dr. Birdie Riddle will call in the RX for Meclizine.  She states she has some but it has expired and she would like a new RX if possible.  Please send to [facility] if approved.

## 2019-06-11 NOTE — Telephone Encounter (Signed)
Please advise? Medication is pended.

## 2019-07-19 ENCOUNTER — Other Ambulatory Visit: Payer: Self-pay | Admitting: Family Medicine

## 2019-09-17 ENCOUNTER — Other Ambulatory Visit: Payer: Self-pay | Admitting: Family Medicine

## 2019-11-26 ENCOUNTER — Ambulatory Visit (INDEPENDENT_AMBULATORY_CARE_PROVIDER_SITE_OTHER): Payer: Medicare HMO | Admitting: Family Medicine

## 2019-11-26 ENCOUNTER — Other Ambulatory Visit: Payer: Self-pay

## 2019-11-26 ENCOUNTER — Other Ambulatory Visit: Payer: Self-pay | Admitting: General Practice

## 2019-11-26 ENCOUNTER — Encounter: Payer: Self-pay | Admitting: Family Medicine

## 2019-11-26 VITALS — BP 139/86 | HR 78 | Temp 97.9°F | Resp 16 | Ht 65.0 in | Wt 157.2 lb

## 2019-11-26 DIAGNOSIS — I10 Essential (primary) hypertension: Secondary | ICD-10-CM | POA: Diagnosis not present

## 2019-11-26 DIAGNOSIS — E0590 Thyrotoxicosis, unspecified without thyrotoxic crisis or storm: Secondary | ICD-10-CM

## 2019-11-26 DIAGNOSIS — R82998 Other abnormal findings in urine: Secondary | ICD-10-CM

## 2019-11-26 DIAGNOSIS — R3915 Urgency of urination: Secondary | ICD-10-CM | POA: Diagnosis not present

## 2019-11-26 LAB — CBC WITH DIFFERENTIAL/PLATELET
Basophils Absolute: 0 10*3/uL (ref 0.0–0.1)
Basophils Relative: 0.8 % (ref 0.0–3.0)
Eosinophils Absolute: 0.2 10*3/uL (ref 0.0–0.7)
Eosinophils Relative: 3.4 % (ref 0.0–5.0)
HCT: 36 % (ref 36.0–46.0)
Hemoglobin: 12 g/dL (ref 12.0–15.0)
Lymphocytes Relative: 32.1 % (ref 12.0–46.0)
Lymphs Abs: 2 10*3/uL (ref 0.7–4.0)
MCHC: 33.2 g/dL (ref 30.0–36.0)
MCV: 92 fl (ref 78.0–100.0)
Monocytes Absolute: 0.5 10*3/uL (ref 0.1–1.0)
Monocytes Relative: 7.7 % (ref 3.0–12.0)
Neutro Abs: 3.4 10*3/uL (ref 1.4–7.7)
Neutrophils Relative %: 56 % (ref 43.0–77.0)
Platelets: 301 10*3/uL (ref 150.0–400.0)
RBC: 3.91 Mil/uL (ref 3.87–5.11)
RDW: 13 % (ref 11.5–15.5)
WBC: 6.1 10*3/uL (ref 4.0–10.5)

## 2019-11-26 LAB — TSH: TSH: 1.92 u[IU]/mL (ref 0.35–4.50)

## 2019-11-26 LAB — POCT URINALYSIS DIPSTICK
Bilirubin, UA: NEGATIVE
Blood, UA: 10
Glucose, UA: NEGATIVE
Ketones, UA: NEGATIVE
Nitrite, UA: NEGATIVE
Protein, UA: NEGATIVE
Spec Grav, UA: 1.01 (ref 1.010–1.025)
Urobilinogen, UA: 0.2 E.U./dL
pH, UA: 7 (ref 5.0–8.0)

## 2019-11-26 LAB — LIPID PANEL
Cholesterol: 170 mg/dL (ref 0–200)
HDL: 48.6 mg/dL (ref >39.00)
LDL Cholesterol: 102 mg/dL — ABNORMAL HIGH (ref 0–99)
NonHDL: 121.06
Total CHOL/HDL Ratio: 3
Triglycerides: 93 mg/dL (ref 0.0–149.0)
VLDL: 18.6 mg/dL (ref 0.0–40.0)

## 2019-11-26 LAB — BASIC METABOLIC PANEL
BUN: 12 mg/dL (ref 6–23)
CO2: 28 mEq/L (ref 19–32)
Calcium: 9.4 mg/dL (ref 8.4–10.5)
Chloride: 104 mEq/L (ref 96–112)
Creatinine, Ser: 0.94 mg/dL (ref 0.40–1.20)
GFR: 70.24 mL/min (ref >60.00)
Glucose, Bld: 90 mg/dL (ref 70–99)
Potassium: 4.5 mEq/L (ref 3.5–5.1)
Sodium: 138 mEq/L (ref 135–145)

## 2019-11-26 LAB — HEPATIC FUNCTION PANEL
ALT: 24 U/L (ref 0–35)
AST: 19 U/L (ref 0–37)
Albumin: 4.3 g/dL (ref 3.5–5.2)
Alkaline Phosphatase: 72 U/L (ref 39–117)
Bilirubin, Direct: 0.1 mg/dL (ref 0.0–0.3)
Total Bilirubin: 0.5 mg/dL (ref 0.2–1.2)
Total Protein: 6.9 g/dL (ref 6.0–8.3)

## 2019-11-26 NOTE — Assessment & Plan Note (Signed)
Currently asymptomatic.  Previously on Methimazole but not currently.  Check labs and refer back to Endo if needed.

## 2019-11-26 NOTE — Progress Notes (Signed)
   Subjective:    [name], female    DOB: 12/24/1945, 74 y.o.   MRN: [id number]  HPI HTN- chronic problem, on Amlodipine 10mg  daily w/ adequate control.  Denies CP, SOB, HAs, visual changes, edema.  Hyperthyroid- pt has hx of this and was previously on Methimazole.  Denies palpitations, changes to skin/hai/nails.  Urinary urgency- worse at night but also having daytime sxs.  Has been ongoing for >6 months.  Review of Systems For ROS see HPI   This visit occurred during the SARS-CoV-2 public health emergency.  Safety protocols were in place, including screening questions prior to the visit, additional usage of staff PPE, and extensive cleaning of exam room while observing appropriate contact time as indicated for disinfecting solutions.       Objective:   Physical Exam Vitals reviewed.  Constitutional:      General: She is not in acute distress.    Appearance: Normal appearance. She is well-developed.  HENT:     Head: Normocephalic and atraumatic.  Eyes:     Conjunctiva/sclera: Conjunctivae normal.     Pupils: Pupils are equal, round, and reactive to light.  Neck:     Thyroid: No thyromegaly.  Cardiovascular:     Rate and Rhythm: Normal rate and regular rhythm.     Heart sounds: Normal heart sounds. No murmur.  Pulmonary:     Effort: Pulmonary effort is normal. No respiratory distress.     Breath sounds: Normal breath sounds.  Abdominal:     General: There is no distension.     Palpations: Abdomen is soft.     Tenderness: There is no abdominal tenderness.  Musculoskeletal:     Cervical back: Normal range of motion and neck supple.  Lymphadenopathy:     Cervical: No cervical adenopathy.  Skin:    General: Skin is warm and dry.  Neurological:     Mental Status: She is alert and oriented to person, place, and time.  Psychiatric:        Mood and Affect: Mood normal.        Behavior: Behavior normal.        Thought Content: Thought content normal.            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  Offered Myrbetriq but pt is not interested in medication at this time.  She prefers to do a UA and r/o any abnormalities before deciding on medication or a urology referral.

## 2019-11-26 NOTE — Patient Instructions (Signed)
Schedule your complete physical and Wellness Visit in 6 months We'll notify you of your lab results and make any changes if needed Continue to work on healthy diet and regular exercise- you can do it! Call with any questions or concerns Stay Safe!  Stay Healthy!

## 2019-11-26 NOTE — Addendum Note (Signed)
Addended by: Betti Cruz on: 11/26/2019 01:50 PM   Modules accepted: Orders

## 2019-11-26 NOTE — Assessment & Plan Note (Signed)
Chronic problem.  Adequate control.  Asymptomatic.  Check labs.  Will follow.

## 2019-11-27 ENCOUNTER — Encounter: Payer: Self-pay | Admitting: General Practice

## 2019-11-28 LAB — URINE CULTURE
MICRO NUMBER:: 10152839
SPECIMEN QUALITY:: ADEQUATE

## 2019-12-10 ENCOUNTER — Other Ambulatory Visit: Payer: Self-pay | Admitting: Family Medicine

## 2019-12-14 ENCOUNTER — Other Ambulatory Visit: Payer: Self-pay | Admitting: Family Medicine

## 2019-12-14 NOTE — Telephone Encounter (Signed)
Last OV 11/26/19 Clonazepam last filled 05/30/19 #60 with 3

## 2020-02-05 ENCOUNTER — Telehealth: Payer: Self-pay | Admitting: Family Medicine

## 2020-02-05 NOTE — Telephone Encounter (Signed)
Left message for patient to schedule Annual Wellness Visit.  Please schedule with Nurse Health Advisor Victoria Britt, RN at [facility] [place]  

## 2020-02-12 ENCOUNTER — Other Ambulatory Visit: Payer: Self-pay | Admitting: Family Medicine

## 2020-02-20 ENCOUNTER — Other Ambulatory Visit: Payer: Self-pay

## 2020-02-20 ENCOUNTER — Ambulatory Visit (INDEPENDENT_AMBULATORY_CARE_PROVIDER_SITE_OTHER): Payer: Medicare HMO

## 2020-02-20 DIAGNOSIS — Z0000 Encounter for general adult medical examination without abnormal findings: Secondary | ICD-10-CM

## 2020-02-20 MED ORDER — CLONAZEPAM 0.5 MG PO TABS: 0.5000 mg | ORAL_TABLET | Freq: Two times a day (BID) | ORAL | 3 refills | Status: DC | PRN

## 2020-02-20 NOTE — Telephone Encounter (Signed)
I called all of Pt pharmacies and canceled any active prescriptions for Clonazepam.   [facility] with 0.

## 2020-02-20 NOTE — Patient Instructions (Signed)
Margaret Torres , Thank you for taking time to come for your Medicare Wellness Visit. I appreciate your ongoing commitment to your health goals. Please review the following plan we discussed and let me know if I can assist you in the future.   Screening recommendations/referrals: Colorectal Screening: up to date; last colonoscopy 03/27/12 Mammogram: recommended; please call and schedule  Bone Density: recommended; please let us know if you would like this ordered   Vision and Dental Exams: Recommended annual ophthalmology exams for early detection of glaucoma and other disorders of the eye Recommended annual dental exams for proper oral hygiene  Vaccinations: Pneumococcal vaccine: Prevnar recommended  Tdap vaccine: recommended every 10 years; Please call your insurance company to determine your out of pocket expense. You also receive this vaccine at your local pharmacy or Health Dept. Shingles vaccine: You may receive this vaccine at your local pharmacy. (see handout)  Covid vaccine: recommended   Advanced directives: Please bring a copy of your POA (Power of Attorney) and/or Living Will to your next appointment.  Goals: Recommend to drink at least 6-8 8oz glasses of water per day and consume a balanced diet rich in fresh fruits and vegetables.   Next appointment: Please schedule your Annual Wellness Visit with your Nurse Health Advisor in one year.  Preventive Care 2 Years and Older, Female Preventive care refers to lifestyle choices and visits with your health care provider that can promote health and wellness. What does preventive care include?  A yearly physical exam. This is also called an annual well check.  Dental exams once or twice a year.  Routine eye exams. Ask your health care provider how often you should have your eyes checked.  Personal lifestyle choices, including:  Daily care of your teeth and gums.  Regular physical activity.  Eating a healthy diet.  Avoiding  tobacco and drug use.  Limiting alcohol use.  Practicing safe sex.  Taking low-dose aspirin every day if recommended by your health care provider.  Taking vitamin and mineral supplements as recommended by your health care provider. What happens during an annual well check? The services and screenings done by your health care provider during your annual well check will depend on your age, overall health, lifestyle risk factors, and family history of disease. Counseling  Your health care provider may ask you questions about your:  Alcohol use.  Tobacco use.  Drug use.  Emotional well-being.  Home and relationship well-being.  Sexual activity.  Eating habits.  History of falls.  Memory and ability to understand (cognition).  Work and work Statistician.  Reproductive health. Screening  You may have the following tests or measurements:  Height, weight, and BMI.  Blood pressure.  Lipid and cholesterol levels. These may be checked every 5 years, or more frequently if you are over 26 years old.  Skin check.  Lung cancer screening. You may have this screening every year starting at age 56 if you have a 30-pack-year history of smoking and currently smoke or have quit within the past 15 years.  Fecal occult blood test (FOBT) of the stool. You may have this test every year starting at age 81.  Flexible sigmoidoscopy or colonoscopy. You may have a sigmoidoscopy every 5 years or a colonoscopy every 10 years starting at age 20.  Hepatitis C blood test.  Hepatitis B blood test.  Sexually transmitted disease (STD) testing.  Diabetes screening. This is done by checking your blood sugar (glucose) after you have not eaten for  a while (fasting). You may have this done every 1-3 years.  Bone density scan. This is done to screen for osteoporosis. You may have this done starting at age 22.  Mammogram. This may be done every 1-2 years. Talk to your health care provider about how  often you should have regular mammograms. Talk with your health care provider about your test results, treatment options, and if necessary, the need for more tests. Vaccines  Your health care provider may recommend certain vaccines, such as:  Influenza vaccine. This is recommended every year.  Tetanus, diphtheria, and acellular pertussis (Tdap, Td) vaccine. You may need a Td booster every 10 years.  Zoster vaccine. You may need this after age 56.  Pneumococcal 13-valent conjugate (PCV13) vaccine. One dose is recommended after age 32.  Pneumococcal polysaccharide (PPSV23) vaccine. One dose is recommended after age 86. Talk to your health care provider about which screenings and vaccines you need and how often you need them. This information is not intended to replace advice given to you by your health care provider. Make sure you discuss any questions you have with your health care provider. Document Released: 10/24/2015 Document Revised: 06/16/2016 Document Reviewed: 07/29/2015 Elsevier Interactive Patient Education  2017 [place] Prevention in the Home Falls can cause injuries. They can happen to people of all ages. There are many things you can do to make your home safe and to help prevent falls. What can I do on the outside of my home?  Regularly fix the edges of walkways and driveways and fix any cracks.  Remove anything that might make you trip as you walk through a door, such as a raised step or threshold.  Trim any bushes or trees on the path to your home.  Use bright outdoor lighting.  Clear any walking paths of anything that might make someone trip, such as rocks or tools.  Regularly check to see if handrails are loose or broken. Make sure that both sides of any steps have handrails.  Any raised decks and porches should have guardrails on the edges.  Have any leaves, snow, or ice cleared regularly.  Use sand or salt on walking paths during winter.  Clean  up any spills in your garage right away. This includes oil or grease spills. What can I do in the bathroom?  Use night lights.  Install grab bars by the toilet and in the tub and shower. Do not use towel bars as grab bars.  Use non-skid mats or decals in the tub or shower.  If you need to sit down in the shower, use a plastic, non-slip stool.  Keep the floor dry. Clean up any water that spills on the floor as soon as it happens.  Remove soap buildup in the tub or shower regularly.  Attach bath mats securely with double-sided non-slip rug tape.  Do not have throw rugs and other things on the floor that can make you trip. What can I do in the bedroom?  Use night lights.  Make sure that you have a light by your bed that is easy to reach.  Do not use any sheets or blankets that are too big for your bed. They should not hang down onto the floor.  Have a firm chair that has side arms. You can use this for support while you get dressed.  Do not have throw rugs and other things on the floor that can make you trip. What can I do in  the kitchen?  Clean up any spills right away.  Avoid walking on wet floors.  Keep items that you use a lot in easy-to-reach places.  If you need to reach something above you, use a strong step stool that has a grab bar.  Keep electrical cords out of the way.  Do not use floor polish or wax that makes floors slippery. If you must use wax, use non-skid floor wax.  Do not have throw rugs and other things on the floor that can make you trip. What can I do with my stairs?  Do not leave any items on the stairs.  Make sure that there are handrails on both sides of the stairs and use them. Fix handrails that are broken or loose. Make sure that handrails are as long as the stairways.  Check any carpeting to make sure that it is firmly attached to the stairs. Fix any carpet that is loose or worn.  Avoid having throw rugs at the top or bottom of the stairs.  If you do have throw rugs, attach them to the floor with carpet tape.  Make sure that you have a light switch at the top of the stairs and the bottom of the stairs. If you do not have them, ask someone to add them for you. What else can I do to help prevent falls?  Wear shoes that:  Do not have high heels.  Have rubber bottoms.  Are comfortable and fit you well.  Are closed at the toe. Do not wear sandals.  If you use a stepladder:  Make sure that it is fully opened. Do not climb a closed stepladder.  Make sure that both sides of the stepladder are locked into place.  Ask someone to hold it for you, if possible.  Clearly mark and make sure that you can see:  Any grab bars or handrails.  First and last steps.  Where the edge of each step is.  Use tools that help you move around (mobility aids) if they are needed. These include:  Canes.  Walkers.  Scooters.  Crutches.  Turn on the lights when you go into a dark area. Replace any light bulbs as soon as they burn out.  Set up your furniture so you have a clear path. Avoid moving your furniture around.  If any of your floors are uneven, fix them.  If there are any pets around you, be aware of where they are.  Review your medicines with your doctor. Some medicines can make you feel dizzy. This can increase your chance of falling. Ask your doctor what other things that you can do to help prevent falls. This information is not intended to replace advice given to you by your health care provider. Make sure you discuss any questions you have with your health care provider. Document Released: 07/24/2009 Document Revised: 03/04/2016 Document Reviewed: 11/01/2014 Elsevier Interactive Patient Education  2017 [employer].

## 2020-02-20 NOTE — Addendum Note (Signed)
Addended by: Desmond Dike L on: 02/20/2020 02:00 PM   Modules accepted: Orders

## 2020-02-20 NOTE — Progress Notes (Signed)
This visit is being conducted via phone call due to the COVID-19 pandemic. This patient has given me verbal consent via phone to conduct this visit, patient states they are participating from their home address. Some vital signs may be absent or patient reported.   Patient identification: identified by name, DOB, and current address.  Location provider: [facility], Office Persons participating in the virtual visit: Denman George LPN, patient, and Dr. Annye Asa   Subjective:   Margaret Torres is a 74 y.o. female who presents for Medicare Annual (Subsequent) preventive examination.  Review of Systems:   Cardiac Risk Factors include: sedentary lifestyle;advanced age (>63men, >32 women);hypertension    Objective:     Vitals: LMP 06/30/2013   There is no height or weight on file to calculate BMI.  Advanced Directives 02/20/2020 05/13/2016 03/01/2015  Does Patient Have a Medical Advance Directive? Yes No No  Type of Paramedic of Many;Living will - -  Does patient want to make changes to medical advance directive? No - Patient declined - -  Copy of [place] in Chart? No - copy requested - -  Would patient like information on creating a medical advance directive? - No - patient declined information No - patient declined information    Tobacco Social History   Tobacco Use  Smoking Status Never Smoker  Smokeless Tobacco Never Used     Counseling given: Not Answered   Clinical Intake:  Pre-visit preparation completed: Yes  Pain : No/denies pain  Diabetes: No  How often do you need to have someone help you when you read instructions, pamphlets, or other written materials from your doctor or pharmacy?: 1 - Never  Interpreter Needed?: No  Information entered by :: Denman George LPN  Past Medical History:  Diagnosis Date  . Anxiety   . Cervical cancer ([place])   . Depression   . Hypertension    Past Surgical  History:  Procedure Laterality Date  . ABDOMINAL HYSTERECTOMY    . WISDOM TOOTH EXTRACTION Bilateral    Family History  Problem Relation Age of Onset  . Cervical cancer Mother   . Hypertension Mother   . Hypertension Father   . Heart disease Brother   . Diabetes Brother   . Stroke Maternal Grandmother   . Cancer Other   . Diabetes Sister   . Stroke Sister   . Cancer Sister        bone cancer  . Breast cancer Sister   . Heart attack Son   . Thyroid disease Neg Hx    Social History   Socioeconomic History  . Marital status: Divorced    Spouse name: Not on file  . Number of children: 3  . Years of education: Not on file  . Highest education level: Not on file  Occupational History  . Occupation: Retired   Tobacco Use  . Smoking status: Never Smoker  . Smokeless tobacco: Never Used  Substance and Sexual Activity  . Alcohol use: No  . Drug use: No  . Sexual activity: Not Currently    Birth control/protection: Post-menopausal  Other Topics Concern  . Not on file  Social History Narrative   Hobbies: enjoys fishing    1 Daughter that lives locally    1 Son lives in [place]    3 [place]    1 Son deceased    Social Determinants of Health   Financial Resource Strain:   . Difficulty of Paying Living Expenses:  Food Insecurity:   . Worried About Charity fundraiser in the Last Year:   . Arboriculturist in the Last Year:   Transportation Needs:   . Film/video editor (Medical):   Marland Kitchen Lack of Transportation (Non-Medical):   Physical Activity:   . Days of Exercise per Week:   . Minutes of Exercise per Session:   Stress:   . Feeling of Stress :   Social Connections:   . Frequency of Communication with Friends and Family:   . Frequency of Social Gatherings with Friends and Family:   . Attends Religious Services:   . Active Member of Clubs or Organizations:   . Attends Archivist Meetings:   Marland Kitchen Marital Status:     Outpatient Encounter Medications as  of 02/20/2020  Medication Sig  . Alum & Mag Hydroxide-Simeth (ANTACID ADVANCED PO) Take by mouth.  Marland Kitchen amLODipine (NORVASC) 10 MG tablet Take 1 tablet by mouth once daily  . aspirin EC 81 MG tablet Take 81 mg by mouth daily.   . clonazePAM (KLONOPIN) 0.5 MG tablet TAKE 1 TABLET (0.5 MG TOTAL) BY MOUTH 2 (TWO) TIMES DAILY AS NEEDED.  . fluticasone (FLONASE) 50 MCG/ACT nasal spray Use 2 spray(s) in each nostril once daily   No facility-administered encounter medications on file as of 02/20/2020.    Activities of Daily Living In your present state of health, do you have any difficulty performing the following activities: 02/20/2020 11/26/2019  Hearing? N N  Vision? N N  Difficulty concentrating or making decisions? N N  Walking or climbing stairs? N N  Dressing or bathing? N N  Doing errands, shopping? N N  Preparing Food and eating ? N -  Using the Toilet? N -  In the past six months, have you accidently leaked urine? N -  Do you have problems with loss of bowel control? N -  Managing your Medications? N -  Managing your Finances? N -  Housekeeping or managing your Housekeeping? N -  Some recent data might be hidden    Patient Care Team: Midge Minium, MD as PCP - General (Family Medicine) Elayne Snare, MD as Consulting Physician (Endocrinology) Carol Ada, MD as Consulting Physician (Gastroenterology)    Assessment:   This is a routine wellness examination for Margaret Torres.  Exercise Activities and Dietary recommendations Current Exercise Habits: The patient does not participate in regular exercise at present  Goals   None     Fall Risk Fall Risk  02/20/2020 11/26/2019 05/25/2019 04/30/2019 04/30/2019  Falls in the past year? 0 0 0 0 0  Number falls in past yr: 0 0 0 0 0  Comment - - - - -  Injury with Fall? 0 0 0 0 0  Follow up Falls evaluation completed;Education provided;Falls prevention discussed Falls evaluation completed - - -   Is the patient's home free of loose  throw rugs in walkways, pet beds, electrical cords, etc?   yes      Grab bars in the bathroom? yes      Handrails on the stairs?   yes      Adequate lighting?   yes   Depression Screen PHQ 2/9 Scores 02/20/2020 11/26/2019 05/25/2019 04/30/2019  PHQ - 2 Score 0 0 4 4  PHQ- 9 Score - 0 4 15     Cognitive Function-no cognitive concerns at this time    6CIT Screen 02/20/2020  What Year? 0 points  What month? 0 points  What time? 0 points  Count back from 20 0 points  Months in reverse 0 points  Repeat phrase 0 points  Total Score 0    Immunization History  Administered Date(s) Administered  . Pneumococcal Polysaccharide-23 03/27/2013    Qualifies for Shingles Vaccine?Discussed and patient will check with pharmacy for coverage.  Patient education handout provided   Screening Tests Health Maintenance  Topic Date Due  . COVID-19 Vaccine (1) Never done  . PNA vac Low Risk Adult (2 of 2 - PCV13) 05/24/2020 (Originally 03/27/2014)  . TETANUS/TDAP  11/25/2020 (Originally 11/12/1963)  . Hepatitis C Screening  11/25/2020 (Originally 1946-06-06)  . DEXA SCAN  02/19/2022 (Originally 11/11/2009)  . COLONOSCOPY  03/27/2022    Cancer Screenings: Lung: Low Dose CT Chest recommended if Age 70-80 years, 30 pack-year currently smoking OR have quit w/in 15years. Patient does not qualify. Breast:  Up to date on Mammogram? No; patient plans to schedule    Up to date of Bone Density/Dexa? No; patient declines at this time  Colorectal: up to date; last colonoscopy 03/27/12    Plan:  I have personally reviewed and addressed the Medicare Annual Wellness questionnaire and have noted the following in the patient's chart:  A. Medical and social history B. Use of alcohol, tobacco or illicit drugs  C. Current medications and supplements D. Functional ability and status E.  Nutritional status F.  Physical activity G. Advance directives H. List of other physicians I.  Hospitalizations, surgeries, and ER  visits in previous 12 months J.  [place] such as hearing and vision if needed, cognitive and depression L. Referrals, records requested, and appointments- none   In addition, I have reviewed and discussed with patient certain preventive protocols, quality metrics, and best practice recommendations. A written personalized care plan for preventive services as well as general preventive health recommendations were provided to patient.   Signed,  Denman George, LPN  Nurse Health Advisor   Nurse Notes: no additional

## 2020-02-20 NOTE — Telephone Encounter (Signed)
This was sent on 3/5 as 60 w/ 3 refills.  This should last her until July.  No refills needed at this time

## 2020-02-20 NOTE — Telephone Encounter (Signed)
Patient is requesting that a refill of Clonazepam be sent [facility] as she wishes to change current pharmacy.

## 2020-02-22 ENCOUNTER — Telehealth: Payer: Self-pay | Admitting: Family Medicine

## 2020-02-22 NOTE — Telephone Encounter (Signed)
Please advise? We keep changing this.

## 2020-02-22 NOTE — Telephone Encounter (Signed)
MEDICATION: clonazepam 035 MG  PHARMACY: [place] at [facility]   Comments:  Pt called stating this medication was sent over to [facility] as she requested. Pt states she went to [facility] and they were charging her $5 more for the medication. Pt asked if Dr. Birdie Riddle could send script back over to [place] at [facility] [address]   Marland KitchenLet patient know to contact pharmacy at the end of the day to make sure medication is ready. **  ** Please notify patient to allow 48-72 hours to process**  **Encourage patient to contact the pharmacy for refills or they can request refills through [facility]**

## 2020-02-25 MED ORDER — CLONAZEPAM 0.5 MG PO TABS: 0.5000 mg | ORAL_TABLET | Freq: Two times a day (BID) | ORAL | 3 refills | Status: DC | PRN

## 2020-02-25 NOTE — Telephone Encounter (Signed)
Prescription sent to her requested pharmacy

## 2020-02-25 NOTE — Addendum Note (Signed)
Addended by: Midge Minium on: 02/25/2020 11:40 AM   Modules accepted: Orders

## 2020-02-25 NOTE — Telephone Encounter (Signed)
Please advise 

## 2020-02-25 NOTE — Telephone Encounter (Signed)
Patient calling back regarding clonazePAM (KLONOPIN) 0.5 MG tablet . Patient would like to have this switched back to  [place] at [facility] [place]

## 2020-03-13 ENCOUNTER — Other Ambulatory Visit: Payer: Self-pay | Admitting: Family Medicine

## 2020-04-18 ENCOUNTER — Telehealth: Payer: Self-pay | Admitting: Emergency Medicine

## 2020-04-18 NOTE — Telephone Encounter (Signed)
Patient scheduled with PCP for 04/22/20   [place] RECORD AccessNurse Patient Name: Margaret Torres Gender: Female DOB: 05-24-1946 Age: 74 Y 57 M 8 D Return Phone Number: [phone] (Primary), [phone] (Secondary) Address: City/State/Zip: [place] Client [place] Client Site [place] - Day Physician Dimple Nanas- MD Contact Type Call Who Is Calling Patient / Member / Family / Caregiver Call Type Triage / Clinical Relationship To Patient Self Return Phone Number [phone] (Primary) Chief Complaint Unclassified Symptom Reason for Call Symptomatic / Request for Health Information Initial Comment PT has trouble sleeping and breathing when going to bed. PT disconnected while being transferred form office. Translation No Nurse Assessment Nurse: Loreen Freud, RN, [place] Date/Time (Eastern Time): 04/18/2020 11:34:50 AM Confirm and document reason for call. If symptomatic, describe symptoms. ---Caller states she has trouble sleeping and breathing when going to bed. She has a cough that has been going on 3-4 months. Her breathing is loud at night. It sounds like wheezing. Cough is producing clear sputum. Has the patient had close contact with a person known or suspected to have the novel coronavirus illness OR traveled / lives in area with major community spread (including international travel) in the last 14 days from the onset of symptoms? * If Asymptomatic, screen for exposure and travel within the last 14 days. ---No Does the patient have any new or worsening symptoms? ---Yes Will a triage be completed? ---Yes Related visit to physician within the last 2 weeks? ---No Does the PT have any chronic conditions? (i.e. diabetes, asthma, this includes High risk factors for pregnancy, etc.) ---Yes List chronic conditions. ---Hypertension Is this a behavioral  health or substance abuse call? ---No Guidelines Guideline Title Affirmed Question Affirmed Notes Nurse Date/Time (Eastern Time) Cough - Chronic [1] Continuous (nonstop) coughing interferes with work or school AND [2] no improvement using [place], RN, [place] 04/18/2020 11:47:08 AMPLEASE NOTE: All timestamps contained within this report are represented as [place] Standard Time. CONFIDENTIALTY NOTICE: This fax transmission is intended only for the addressee. It contains information that is legally privileged, confidential or otherwise protected from use or disclosure. If you are not the intended recipient, you are strictly prohibited from reviewing, disclosing, copying using or disseminating any of this information or taking any action in reliance on or regarding this information. If you have received this fax in error, please notify us immediately by telephone so that we can arrange for its return to [name]. Phone: [phone], Toll-Free: [phone], Fax: [phone] Page: 2 of 2 Call Id: [id number] Guidelines Guideline Title Affirmed Question Affirmed Notes Nurse Date/Time Eilene Ghazi Time) cough treatment per protocol Disp. Time Eilene Ghazi Time) Disposition Final User 04/18/2020 11:54:54 AM See PCP within 24 Hours Yes [place], RN, Museum/gallery conservator Disagree/Comply Comply Caller Understands Yes PreDisposition Did not know what to do Care Advice Given Per Guideline SEE PCP WITHIN 24 HOURS: * IF OFFICE WILL BE OPEN: You need to be examined within the next 24 hours. Call your doctor (or NP/PA) when the office opens and make an appointment. HUMIDIFIER: * If the air is dry, use a humidifier in the bedroom. CALL BACK IF: * Difficulty breathing occurs * You become worse. CARE ADVICE given per Cough - Chronic (Adult) guideline. Referrals REFERRED TO PCP OFFICE

## 2020-04-22 ENCOUNTER — Encounter: Payer: Self-pay | Admitting: Family Medicine

## 2020-04-22 ENCOUNTER — Ambulatory Visit (INDEPENDENT_AMBULATORY_CARE_PROVIDER_SITE_OTHER)
Admission: RE | Admit: 2020-04-22 | Discharge: 2020-04-22 | Disposition: A | Discharge status: Home / Self Care | Payer: Medicare HMO | Source: Ambulatory Visit | Attending: Family Medicine | Admitting: Family Medicine

## 2020-04-22 ENCOUNTER — Ambulatory Visit (INDEPENDENT_AMBULATORY_CARE_PROVIDER_SITE_OTHER): Payer: Medicare HMO | Admitting: Family Medicine

## 2020-04-22 ENCOUNTER — Other Ambulatory Visit: Payer: Self-pay

## 2020-04-22 VITALS — BP 137/83 | HR 80 | Temp 98.2°F | Resp 16 | Ht 65.0 in | Wt 158.0 lb

## 2020-04-22 DIAGNOSIS — R05 Cough: Secondary | ICD-10-CM | POA: Diagnosis not present

## 2020-04-22 DIAGNOSIS — K219 Gastro-esophageal reflux disease without esophagitis: Secondary | ICD-10-CM | POA: Diagnosis not present

## 2020-04-22 DIAGNOSIS — R058 Other specified cough: Secondary | ICD-10-CM

## 2020-04-22 DIAGNOSIS — R062 Wheezing: Secondary | ICD-10-CM

## 2020-04-22 MED ORDER — OMEPRAZOLE 40 MG PO CPDR: 40.0000 mg | DELAYED_RELEASE_CAPSULE | Freq: Every day | ORAL | 3 refills | Status: DC

## 2020-04-22 MED ORDER — LEVOCETIRIZINE DIHYDROCHLORIDE 5 MG PO TABS: 5.0000 mg | ORAL_TABLET | Freq: Every evening | ORAL | 3 refills | Status: DC

## 2020-04-22 NOTE — Assessment & Plan Note (Signed)
Deteriorated.  Pt reports she has 'upset stomach' after eating and is unable to lie on her back due to GERD.  She has not been taking any reflux medication- previously on Pantoprazole.  Discussed lifestyle modifications- small but frequent meals, not lying down after eating.  Suspect this is contributing to nocturnal cough/wheezing.  Start Omeprazole.  Will follow.

## 2020-04-22 NOTE — Progress Notes (Signed)
   Subjective:    [name], female    DOB: September 01, 1946, 74 y.o.   MRN: [id number]  HPI 'the wheezing at night is awful'- pt reports she is able to take a deep breath but 'it gets really loud'.  Reports she will have occasional SOB at breath at night.  sxs started 'a couple of months' ago.  Initially pt was unable to sleep on L side or back.  Pt reports she will feel some acid reflux when lying on her back.  No nausea.  No CP.  No longer taking PPI. Reports upset stomach after eating.  Denies swelling of hands/feet.   Pt reports fatigue from not sleeping due to coughing.  Review of Systems For ROS see HPI   This visit occurred during the SARS-CoV-2 public health emergency.  Safety protocols were in place, including screening questions prior to the visit, additional usage of staff PPE, and extensive cleaning of exam room while observing appropriate contact time as indicated for disinfecting solutions.       Objective:   Physical Exam Vitals reviewed.  Constitutional:      General: She is not in acute distress.    Appearance: Normal appearance. She is well-developed. She is not ill-appearing.  HENT:     Head: Normocephalic and atraumatic.  Eyes:     Conjunctiva/sclera: Conjunctivae normal.     Pupils: Pupils are equal, round, and reactive to light.  Neck:     Thyroid: No thyromegaly.  Cardiovascular:     Rate and Rhythm: Normal rate and regular rhythm.     Heart sounds: Normal heart sounds. No murmur heard.   Pulmonary:     Effort: Pulmonary effort is normal. No respiratory distress.     Breath sounds: No stridor. Wheezing (diffuse end expiratory wheezes) present. No rhonchi.  Abdominal:     General: There is no distension.     Palpations: Abdomen is soft.     Tenderness: There is no abdominal tenderness.  Musculoskeletal:     Cervical back: Normal range of motion and neck supple.     Right lower leg: No edema.     Left lower leg: No edema.  Lymphadenopathy:      Cervical: No cervical adenopathy.  Skin:    General: Skin is warm and dry.  Neurological:     Mental Status: She is alert and oriented to person, place, and time.  Psychiatric:        Behavior: Behavior normal.           Assessment & Plan:  Nocturnal cough/wheeze- new to provider, ongoing for pt over last few months.  Suspect this is multifactorial- PND, GERD.  She has no sxs of CHF- no swelling, CP.  Will get CXR to assess.  Start PPI, daily antihistamine.  If no improvement in sxs after 2 weeks of treatment, will refer to pulmonary for complete evaluation and tx.  Pt expressed understanding and is in agreement w/ plan.

## 2020-04-22 NOTE — Patient Instructions (Addendum)
Follow up in 2 weeks to recheck breathing and cough Go to [facility] across from [facility]) to get your chest xray START the Omeprazole once daily to decrease acid reflux CONTINUE the Flonase daily ADD the Xyzal (levocetirizine) once daily to decrease allergy congestion and drainage AVOID eating right before you lie down If symptoms don't improve or worsen, let me know! Call with any questions or concerns Hang in there!

## 2020-04-23 DIAGNOSIS — H5203 Hypermetropia, bilateral: Secondary | ICD-10-CM | POA: Diagnosis not present

## 2020-04-23 DIAGNOSIS — H524 Presbyopia: Secondary | ICD-10-CM | POA: Diagnosis not present

## 2020-04-29 ENCOUNTER — Other Ambulatory Visit: Payer: Self-pay | Admitting: Family Medicine

## 2020-04-29 DIAGNOSIS — Z1231 Encounter for screening mammogram for malignant neoplasm of breast: Secondary | ICD-10-CM

## 2020-05-02 ENCOUNTER — Ambulatory Visit
Admission: RE | Admit: 2020-05-02 | Discharge: 2020-05-02 | Disposition: A | Discharge status: Home / Self Care | Payer: Medicare HMO | Source: Ambulatory Visit | Attending: Family Medicine | Admitting: Family Medicine

## 2020-05-02 ENCOUNTER — Other Ambulatory Visit: Payer: Self-pay

## 2020-05-02 DIAGNOSIS — Z1231 Encounter for screening mammogram for malignant neoplasm of breast: Secondary | ICD-10-CM

## 2020-05-08 ENCOUNTER — Ambulatory Visit (INDEPENDENT_AMBULATORY_CARE_PROVIDER_SITE_OTHER): Payer: Medicare HMO | Admitting: Family Medicine

## 2020-05-08 ENCOUNTER — Other Ambulatory Visit: Payer: Self-pay

## 2020-05-08 ENCOUNTER — Encounter: Payer: Self-pay | Admitting: Family Medicine

## 2020-05-08 VITALS — BP 131/84 | HR 86 | Temp 98.1°F | Resp 16 | Ht 65.0 in | Wt 157.2 lb

## 2020-05-08 DIAGNOSIS — R82998 Other abnormal findings in urine: Secondary | ICD-10-CM

## 2020-05-08 DIAGNOSIS — R05 Cough: Secondary | ICD-10-CM

## 2020-05-08 DIAGNOSIS — R8290 Unspecified abnormal findings in urine: Secondary | ICD-10-CM | POA: Diagnosis not present

## 2020-05-08 DIAGNOSIS — R062 Wheezing: Secondary | ICD-10-CM

## 2020-05-08 DIAGNOSIS — R058 Other specified cough: Secondary | ICD-10-CM

## 2020-05-08 LAB — POCT URINALYSIS DIPSTICK
Bilirubin, UA: NEGATIVE
Blood, UA: NEGATIVE
Glucose, UA: NEGATIVE
Ketones, UA: NEGATIVE
Nitrite, UA: NEGATIVE
Protein, UA: POSITIVE — AB
Spec Grav, UA: <=1.005 — AB (ref 1.010–1.025)
Urobilinogen, UA: 0.2 E.U./dL
pH, UA: 6.5 (ref 5.0–8.0)

## 2020-05-08 NOTE — Progress Notes (Signed)
   Subjective:    [name], female    DOB: 08-31-1946, 74 y.o.   MRN: [id number]  HPI Nocturnal cough/wheeze- pt was started on Omeprazole at last visit.  Pt reports the coughing is much better since starting the PPI.  Wheezing is much less.  Was not able to tolerate the Xyzal so has added Mucinex.  Urinary odor- pt reports it smells strong.  No burning w/ urination.  + suprapubic.  No CVA tenderness.  + frequency.   Review of Systems For ROS see HPI   This visit occurred during the SARS-CoV-2 public health emergency.  Safety protocols were in place, including screening questions prior to the visit, additional usage of staff PPE, and extensive cleaning of exam room while observing appropriate contact time as indicated for disinfecting solutions.       Objective:   Physical Exam Vitals reviewed.  Constitutional:      General: She is not in acute distress.    Appearance: Normal appearance. She is not ill-appearing.  HENT:     Head: Normocephalic and atraumatic.  Eyes:     Extraocular Movements: Extraocular movements intact.     Pupils: Pupils are equal, round, and reactive to light.  Cardiovascular:     Rate and Rhythm: Normal rate and regular rhythm.     Pulses: Normal pulses.     Heart sounds: Normal heart sounds. No murmur heard.   Pulmonary:     Effort: Pulmonary effort is normal. No respiratory distress.     Breath sounds: Normal breath sounds. No wheezing or rhonchi.  Abdominal:     General: Abdomen is flat.     Palpations: Abdomen is soft.     Tenderness: There is no abdominal tenderness. There is no right CVA tenderness, left CVA tenderness or guarding.  Musculoskeletal:     Cervical back: Normal range of motion and neck supple.  Lymphadenopathy:     Cervical: No cervical adenopathy.  Skin:    General: Skin is warm and dry.  Neurological:     General: No focal deficit present.     Mental Status: She is alert and oriented to person, place, and  time.  Psychiatric:        Mood and Affect: Mood normal.        Behavior: Behavior normal.        Thought Content: Thought content normal.           Assessment & Plan:  Nocturnal cough w/ wheeze- much improved since starting PPI.  Pt states she was unable to take the allergy medication- 'it had HCl and I can't do that'.  Admits that she breathes much better in places w/o carpet and is considering removing her carpet or getting an air purifier.  I told her both of those would be great options.  Will follow.  Urine Odor- new.  Pt w/o specific sxs of UTI (no dysuria, hematuria, urgency) but UA has leuks so will culture prior to starting abx.  Pt expressed understanding and is in agreement w/ plan.

## 2020-05-08 NOTE — Patient Instructions (Signed)
Follow up as needed or as scheduled We'll notify you of your urine results and make any changes if needed CONTINUE the Omeprazole daily Your heart and lungs sound great!  I suspect the wheezing is due to allergies Call with any questions or concerns Hang in there!!!

## 2020-05-09 ENCOUNTER — Other Ambulatory Visit: Payer: Self-pay | Admitting: Family Medicine

## 2020-05-09 LAB — URINE CULTURE
MICRO NUMBER:: 10765690
Result:: NO GROWTH
SPECIMEN QUALITY:: ADEQUATE

## 2020-05-22 ENCOUNTER — Ambulatory Visit
Admission: RE | Admit: 2020-05-22 | Discharge: 2020-05-22 | Disposition: A | Discharge status: Home / Self Care | Payer: Medicare HMO | Source: Ambulatory Visit | Attending: Family Medicine | Admitting: Family Medicine

## 2020-05-22 ENCOUNTER — Other Ambulatory Visit: Payer: Self-pay | Admitting: Family Medicine

## 2020-05-22 ENCOUNTER — Other Ambulatory Visit: Payer: Self-pay

## 2020-05-22 DIAGNOSIS — Z1231 Encounter for screening mammogram for malignant neoplasm of breast: Secondary | ICD-10-CM

## 2020-05-28 ENCOUNTER — Other Ambulatory Visit: Payer: Self-pay | Admitting: General Practice

## 2020-05-28 ENCOUNTER — Encounter: Payer: Self-pay | Admitting: Family Medicine

## 2020-05-28 ENCOUNTER — Other Ambulatory Visit: Payer: Self-pay

## 2020-05-28 ENCOUNTER — Ambulatory Visit (INDEPENDENT_AMBULATORY_CARE_PROVIDER_SITE_OTHER): Payer: Medicare HMO | Admitting: Family Medicine

## 2020-05-28 VITALS — BP 116/78 | HR 78 | Temp 98.1°F | Resp 16 | Ht 65.0 in | Wt 155.2 lb

## 2020-05-28 DIAGNOSIS — R350 Frequency of micturition: Secondary | ICD-10-CM

## 2020-05-28 DIAGNOSIS — I10 Essential (primary) hypertension: Secondary | ICD-10-CM | POA: Diagnosis not present

## 2020-05-28 DIAGNOSIS — Z0000 Encounter for general adult medical examination without abnormal findings: Secondary | ICD-10-CM

## 2020-05-28 DIAGNOSIS — R8290 Unspecified abnormal findings in urine: Secondary | ICD-10-CM

## 2020-05-28 DIAGNOSIS — R82998 Other abnormal findings in urine: Secondary | ICD-10-CM

## 2020-05-28 LAB — POCT URINALYSIS DIPSTICK
Bilirubin, UA: NEGATIVE
Blood, UA: POSITIVE
Glucose, UA: NEGATIVE
Ketones, UA: NEGATIVE
Leukocytes, UA: NEGATIVE
Nitrite, UA: NEGATIVE
Protein, UA: NEGATIVE
Spec Grav, UA: 1.015 (ref 1.010–1.025)
Urobilinogen, UA: 0.2 E.U./dL
pH, UA: 6 (ref 5.0–8.0)

## 2020-05-28 LAB — CBC WITH DIFFERENTIAL/PLATELET
Basophils Absolute: 0 10*3/uL (ref 0.0–0.1)
Basophils Relative: 0.2 % (ref 0.0–3.0)
Eosinophils Absolute: 0.3 10*3/uL (ref 0.0–0.7)
Eosinophils Relative: 5.4 % — ABNORMAL HIGH (ref 0.0–5.0)
HCT: 37.2 % (ref 36.0–46.0)
Hemoglobin: 12.5 g/dL (ref 12.0–15.0)
Lymphocytes Relative: 33 % (ref 12.0–46.0)
Lymphs Abs: 2.1 10*3/uL (ref 0.7–4.0)
MCHC: 33.6 g/dL (ref 30.0–36.0)
MCV: 90.9 fl (ref 78.0–100.0)
Monocytes Absolute: 0.5 10*3/uL (ref 0.1–1.0)
Monocytes Relative: 7.6 % (ref 3.0–12.0)
Neutro Abs: 3.4 10*3/uL (ref 1.4–7.7)
Neutrophils Relative %: 53.8 % (ref 43.0–77.0)
Platelets: 274 10*3/uL (ref 150.0–400.0)
RBC: 4.09 Mil/uL (ref 3.87–5.11)
RDW: 13.9 % (ref 11.5–15.5)
WBC: 6.3 10*3/uL (ref 4.0–10.5)

## 2020-05-28 LAB — BASIC METABOLIC PANEL
BUN: 15 mg/dL (ref 6–23)
CO2: 27 mEq/L (ref 19–32)
Calcium: 9.6 mg/dL (ref 8.4–10.5)
Chloride: 103 mEq/L (ref 96–112)
Creatinine, Ser: 1.18 mg/dL (ref 0.40–1.20)
GFR: 53.95 mL/min — ABNORMAL LOW (ref >60.00)
Glucose, Bld: 85 mg/dL (ref 70–99)
Potassium: 4.4 mEq/L (ref 3.5–5.1)
Sodium: 138 mEq/L (ref 135–145)

## 2020-05-28 LAB — HEPATIC FUNCTION PANEL
ALT: 17 U/L (ref 0–35)
AST: 17 U/L (ref 0–37)
Albumin: 4.3 g/dL (ref 3.5–5.2)
Alkaline Phosphatase: 70 U/L (ref 39–117)
Bilirubin, Direct: 0.1 mg/dL (ref 0.0–0.3)
Total Bilirubin: 0.6 mg/dL (ref 0.2–1.2)
Total Protein: 7 g/dL (ref 6.0–8.3)

## 2020-05-28 LAB — LIPID PANEL
Cholesterol: 171 mg/dL (ref 0–200)
HDL: 51.1 mg/dL (ref >39.00)
LDL Cholesterol: 102 mg/dL — ABNORMAL HIGH (ref 0–99)
NonHDL: 119.81
Total CHOL/HDL Ratio: 3
Triglycerides: 88 mg/dL (ref 0.0–149.0)
VLDL: 17.6 mg/dL (ref 0.0–40.0)

## 2020-05-28 LAB — TSH: TSH: 1.2 u[IU]/mL (ref 0.35–4.50)

## 2020-05-28 MED ORDER — MECLIZINE HCL 25 MG PO TABS: 25.0000 mg | ORAL_TABLET | Freq: Three times a day (TID) | ORAL | 0 refills | Status: DC | PRN

## 2020-05-28 NOTE — Addendum Note (Signed)
Addended by: Fritz Pickerel on: 05/28/2020 11:50 AM   Modules accepted: Orders

## 2020-05-28 NOTE — Assessment & Plan Note (Signed)
Pt's PE unchanged from previous.  UTD on colonoscopy.  Refuses Prevnar.  UTD on COVID vaccines.  UTD on mammo.  Check labs.  Anticipatory guidance provided.

## 2020-05-28 NOTE — Assessment & Plan Note (Signed)
Chronic problem, adequate control.  Asymptomatic.  Check labs.  Will follow.

## 2020-05-28 NOTE — Progress Notes (Signed)
   Subjective:    [name], female    DOB: 1946/03/10, 74 y.o.   MRN: [id number]  HPI CPE- UTD on colonoscopy, mammo.  Due for Prevnar- pt declines.  UTD on COVID vaccines.  Reviewed past medical, surgical, family and social histories.   Health Maintenance  Topic Date Due  . PNA vac Low Risk Adult (2 of 2 - PCV13) 03/27/2014  . TETANUS/TDAP  11/25/2020 (Originally 11/12/1963)  . Hepatitis C Screening  11/25/2020 (Originally [birth year])  . DEXA SCAN  02/19/2022 (Originally 11/11/2009)  . COLONOSCOPY  03/27/2022  . COVID-19 Vaccine  Completed     Patient Care Team    Relationship Specialty Notifications Start End  Midge Minium, MD PCP - General Family Medicine  03/27/14   Elayne Snare, MD Consulting Physician Endocrinology  05/13/16   Carol Ada, MD Consulting Physician Gastroenterology  05/13/16       Review of Systems Patient reports no vision/ hearing changes, adenopathy,fever, weight change,  persistant/recurrent hoarseness , swallowing issues, chest pain, palpitations, edema, persistant/recurrent cough, hemoptysis, dyspnea (rest/exertional/paroxysmal nocturnal), gastrointestinal bleeding (melena, rectal bleeding), abdominal pain, significant heartburn, Gyn symptoms (abnormal  bleeding, pain),  syncope, focal weakness, memory loss, numbness & tingling, skin/hair/nail changes, abnormal bruising or bleeding, anxiety, or depression.   + 'my bowels are crazy'- pt is lactose intolerant but reports she needs to be careful what she eats b/c she has diarrhea + urinary frequency  This visit occurred during the SARS-CoV-2 public health emergency.  Safety protocols were in place, including screening questions prior to the visit, additional usage of staff PPE, and extensive cleaning of exam room while observing appropriate contact time as indicated for disinfecting solutions.       Objective:   Physical Exam General Appearance:    Alert, cooperative, no distress, appears  stated age  Head:    Normocephalic, without obvious abnormality, atraumatic  Eyes:    PERRL, conjunctiva/corneas clear, EOM's intact, fundi    benign, both eyes  Ears:    Normal TM's and external ear canals, both ears  Nose:   Deferred due to COVID  Throat:   Neck:   Supple, symmetrical, trachea midline, no adenopathy;    Thyroid: no enlargement/tenderness/nodules  Back:     Symmetric, no curvature, ROM normal, no CVA tenderness  Lungs:     Clear to auscultation bilaterally, respirations unlabored  Chest Wall:    No tenderness or deformity   Heart:    Regular rate and rhythm, S1 and S2 normal, no murmur, rub   or gallop  Breast Exam:    Deferred to mammo  Abdomen:     Soft, non-tender, bowel sounds active all four quadrants,    no masses, no organomegaly  Genitalia:    Deferred  Rectal:    Extremities:   Extremities normal, atraumatic, no cyanosis or edema  Pulses:   2+ and symmetric all extremities  Skin:   Skin color, texture, turgor normal, no rashes or lesions  Lymph nodes:   Cervical, supraclavicular, and axillary nodes normal  Neurologic:   CNII-XII intact, normal strength, sensation and reflexes    throughout          Assessment & Plan:

## 2020-05-28 NOTE — Patient Instructions (Addendum)
Follow up in 6 months to recheck BP We'll notify you of your lab results and make any changes if needed Try and eat less sugar! Call with any questions or concerns Stay Safe!  Stay Healthy!

## 2020-06-02 LAB — URINE CULTURE
MICRO NUMBER:: 10852840
SPECIMEN QUALITY:: ADEQUATE

## 2020-06-02 LAB — UNLABELED: Test Ordered On Req: 395

## 2020-06-02 LAB — PAT ID TIQ DOC: Test Affected: 395

## 2020-06-02 NOTE — Progress Notes (Signed)
Called pt and VM was full.

## 2020-06-04 ENCOUNTER — Ambulatory Visit: Payer: Medicare HMO

## 2020-06-05 ENCOUNTER — Other Ambulatory Visit: Payer: Self-pay

## 2020-06-05 ENCOUNTER — Ambulatory Visit: Payer: Medicare HMO

## 2020-06-06 ENCOUNTER — Encounter: Payer: Self-pay | Admitting: General Practice

## 2020-06-06 ENCOUNTER — Ambulatory Visit (INDEPENDENT_AMBULATORY_CARE_PROVIDER_SITE_OTHER): Payer: Medicare HMO

## 2020-06-06 ENCOUNTER — Ambulatory Visit: Payer: Medicare HMO

## 2020-06-06 DIAGNOSIS — R350 Frequency of micturition: Secondary | ICD-10-CM | POA: Diagnosis not present

## 2020-06-06 NOTE — Progress Notes (Signed)
Margaret Torres, 74 year old female presents to the office to drop off urine specimen for repeat urine culture per PCP's order on 06/03/20. Urine collected and [facility]. Fritz Pickerel, LPN

## 2020-06-08 LAB — URINE CULTURE
MICRO NUMBER:: 10882568
Result:: NO GROWTH
SPECIMEN QUALITY:: ADEQUATE

## 2020-06-09 ENCOUNTER — Encounter: Payer: Self-pay | Admitting: General Practice

## 2020-06-25 ENCOUNTER — Telehealth: Payer: Self-pay | Admitting: Family Medicine

## 2020-06-25 NOTE — Progress Notes (Signed)
  Chronic Care Management   Outreach Note  06/25/2020 Name: Margaret Torres MRN: [id number] DOB: 1946/08/31  Referred by: Midge Minium, MD Reason for referral : Chronic Care Management   Chronic Care Management   Outreach Note  06/25/2020 Name: Margaret Torres MRN: [id number] DOB: [birth year]  Referred by: Midge Minium, MD Reason for referral : Chronic Care Management   An unsuccessful telephone outreach was attempted today. The patient was referred to the pharmacist for assistance with care management and care coordination.   Follow Up Plan:     Lauretta Grill Upstream Scheduler

## 2020-07-08 ENCOUNTER — Telehealth: Payer: Self-pay | Admitting: Family Medicine

## 2020-07-08 NOTE — Chronic Care Management (AMB) (Signed)
  Chronic Care Management   Outreach Note  07/08/2020 Name: Margaret Torres MRN: [id number] DOB: 07-12-1946  Referred by: Midge Minium, MD Reason for referral : No chief complaint on file.   A second unsuccessful telephone outreach was attempted today. The patient was referred to pharmacist for assistance with care management and care coordination.  Follow Up Plan:   Lauretta Grill Upstream Scheduler

## 2020-07-13 ENCOUNTER — Other Ambulatory Visit: Payer: Self-pay | Admitting: Family Medicine

## 2020-07-15 ENCOUNTER — Telehealth: Payer: Self-pay | Admitting: Family Medicine

## 2020-07-15 NOTE — Chronic Care Management (AMB) (Signed)
°  Chronic Care Management   Note  07/15/2020 Name: [name] MRN: [id number] DOB: 1945-10-21  [name] is a 74 y.o. year old female who is a primary care patient of Birdie Riddle, Aundra Millet, MD. I reached out to [name] by phone today in response to a referral sent by Ms. Ellianne Enis's PCP, Midge Minium, MD.   [name] was given information about Chronic Care Management services today including:  1. CCM service includes personalized support from designated clinical staff supervised by her physician, including individualized plan of care and coordination with other care providers 2. 24/7 contact phone numbers for assistance for urgent and routine care needs. 3. Service will only be billed when office clinical staff spend 20 minutes or more in a month to coordinate care. 4. Only one practitioner may furnish and bill the service in a calendar month. 5. The patient may stop CCM services at any time (effective at the end of the month) by phone call to the office staff.   Patient agreed to services and verbal consent obtained.   Follow up plan:   Lauretta Grill Upstream Scheduler

## 2020-07-21 ENCOUNTER — Encounter (HOSPITAL_COMMUNITY): Payer: Self-pay | Admitting: Emergency Medicine

## 2020-07-21 ENCOUNTER — Ambulatory Visit (HOSPITAL_COMMUNITY)
Admission: EM | Admit: 2020-07-21 | Discharge: 2020-07-21 | Disposition: A | Discharge status: Home / Self Care | Payer: Medicare HMO | Attending: Internal Medicine | Admitting: Internal Medicine

## 2020-07-21 ENCOUNTER — Other Ambulatory Visit: Payer: Self-pay

## 2020-07-21 DIAGNOSIS — R319 Hematuria, unspecified: Secondary | ICD-10-CM | POA: Diagnosis not present

## 2020-07-21 LAB — CBC WITH DIFFERENTIAL/PLATELET
Abs Immature Granulocytes: 0.02 10*3/uL (ref 0.00–0.07)
Basophils Absolute: 0.1 10*3/uL (ref 0.0–0.1)
Basophils Relative: 1 %
Eosinophils Absolute: 0.2 10*3/uL (ref 0.0–0.5)
Eosinophils Relative: 2 %
HCT: 34.3 % — ABNORMAL LOW (ref 36.0–46.0)
Hemoglobin: 11.7 g/dL — ABNORMAL LOW (ref 12.0–15.0)
Immature Granulocytes: 0 %
Lymphocytes Relative: 30 %
Lymphs Abs: 2.3 10*3/uL (ref 0.7–4.0)
MCH: 30.4 pg (ref 26.0–34.0)
MCHC: 34.1 g/dL (ref 30.0–36.0)
MCV: 89.1 fL (ref 80.0–100.0)
Monocytes Absolute: 0.4 10*3/uL (ref 0.1–1.0)
Monocytes Relative: 6 %
Neutro Abs: 4.7 10*3/uL (ref 1.7–7.7)
Neutrophils Relative %: 61 %
Platelets: 314 10*3/uL (ref 150–400)
RBC: 3.85 MIL/uL — ABNORMAL LOW (ref 3.87–5.11)
RDW: 13 % (ref 11.5–15.5)
WBC: 7.7 10*3/uL (ref 4.0–10.5)
nRBC: 0 % (ref 0.0–0.2)

## 2020-07-21 LAB — POCT URINALYSIS DIPSTICK, ED / UC
Glucose, UA: NEGATIVE mg/dL
Ketones, ur: NEGATIVE mg/dL
Nitrite: NEGATIVE
Protein, ur: >=300 mg/dL — AB
Specific Gravity, Urine: 1.01 (ref 1.005–1.030)
Urobilinogen, UA: 0.2 mg/dL (ref 0.0–1.0)
pH: 7 (ref 5.0–8.0)

## 2020-07-21 MED ORDER — PHENAZOPYRIDINE HCL 200 MG PO TABS
200.0000 mg | ORAL_TABLET | Freq: Three times a day (TID) | ORAL | 0 refills | Status: DC
Start: 2020-07-21 — End: 2020-08-05

## 2020-07-21 MED ORDER — OXYMETAZOLINE HCL 0.05 % NA SOLN
NASAL | Status: AC
  Filled 2020-07-21: qty 30

## 2020-07-21 NOTE — ED Provider Notes (Signed)
[facility]    CSN: [id number] Arrival date & time: 07/21/20  0950      History   Chief Complaint Chief Complaint  Patient presents with  . Hematuria  . Abdominal Pain    HPI Margaret Torres is a 74 y.o. female comes to urgent care with 2-day history of painless bloody urine.  Patient says the symptoms started couple of days to a few days ago and has been persistent.  She has been passing blood clots.  She denies any vaginal bleeding.  She denies any dysuria urgency at the outset of her symptoms but now she is beginning to have some pressure in the suprapubic area as well as discomfort at the end of urination.  She has some urinary hesitancy as well.  No fever, chills, flank pain.  No falls or trauma to the abdomen.  Patient has had urge incontinence for the past several weeks.  No weight loss or night sweats.   HPI  Past Medical History:  Diagnosis Date  . Anxiety   . Cervical cancer ([place])   . Depression   . Hypertension     Patient Active Problem List   Diagnosis Date Noted  . Primary osteoarthritis of both knees 10/20/2018  . GERD (gastroesophageal reflux disease) 10/12/2018  . Multiple falls 10/12/2018  . Multiple thyroid nodules 12/30/2017  . Hyperthyroidism 04/21/2016  . Bowel incontinence 04/21/2016  . Leg pain, bilateral 03/02/2016  . Dysphagia 03/02/2016  . Olecranon bursitis of left elbow 01/30/2016  . Physical exam 01/27/2015  . Bilateral renal cysts 01/27/2015  . Left anterior knee pain 09/30/2014  . Insomnia 06/20/2014  . Hemorrhoids, external 03/27/2014  . Anxiety and depression 07/16/2007  . Essential hypertension 07/16/2007  . Allergic rhinitis 07/16/2007  . Hyperlipidemia 06/26/2007  . TAH/BSO, HX OF 06/26/2007    Past Surgical History:  Procedure Laterality Date  . ABDOMINAL HYSTERECTOMY    . WISDOM TOOTH EXTRACTION Bilateral     OB History   No obstetric history on file.      Home Medications    Prior to Admission  medications   Medication Sig Start Date End Date Taking? Authorizing Provider  amLODipine (NORVASC) 10 MG tablet Take 1 tablet by mouth once daily 03/13/20   Midge Minium, MD  aspirin EC 81 MG tablet Take 81 mg by mouth daily.     [provider]  clonazePAM (KLONOPIN) 0.5 MG tablet Take 1 tablet (0.5 mg total) by mouth 2 (two) times daily as needed. 02/25/20   Midge Minium, MD  fluticasone Asencion Islam) 50 MCG/ACT nasal spray Use 2 spray(s) in each nostril once daily 07/14/20   Midge Minium, MD  levocetirizine (XYZAL) 5 MG tablet Take 1 tablet (5 mg total) by mouth every evening. Patient not taking: Reported on 05/08/2020 04/22/20   Midge Minium, MD  meclizine (ANTIVERT) 25 MG tablet Take 1 tablet (25 mg total) by mouth 3 (three) times daily as needed for dizziness. 05/28/20   Midge Minium, MD  omeprazole (PRILOSEC) 40 MG capsule Take 1 capsule (40 mg total) by mouth daily. 04/22/20   Midge Minium, MD    Family History Family History  Problem Relation Age of Onset  . Cervical cancer Mother   . Hypertension Mother   . Hypertension Father   . Heart disease Brother   . Diabetes Brother   . Stroke Maternal Grandmother   . Cancer Other   . Diabetes Sister   . Stroke Sister   .  Cancer Sister        bone cancer  . Breast cancer Sister   . Heart attack Son   . Thyroid disease Neg Hx     Social History Social History   Tobacco Use  . Smoking status: Never Smoker  . Smokeless tobacco: Never Used  Substance Use Topics  . Alcohol use: No  . Drug use: No     Allergies   Influenza vaccines   Review of Systems Review of Systems  Constitutional: Negative.   Gastrointestinal: Positive for abdominal pain. Negative for diarrhea, nausea and vomiting.  Genitourinary: Positive for difficulty urinating, hematuria and urgency. Negative for flank pain, frequency, pelvic pain and vaginal discharge.  Musculoskeletal: Negative.   Skin: Negative.       Physical Exam Triage Vital Signs ED Triage Vitals  Enc Vitals Group     BP 07/21/20 1123 (!) 147/83     Pulse Rate 07/21/20 1123 86     Resp 07/21/20 1123 17     Temp 07/21/20 1123 98.2 F (36.8 C)     Temp Source 07/21/20 1123 Oral     SpO2 07/21/20 1123 99 %     Weight --      Height --      Head Circumference --      Peak Flow --      Pain Score 07/21/20 1121 5     Pain Loc --      Pain Edu? --      Excl. in [place]? --    No data found.  Updated Vital Signs BP (!) 147/83 (BP Location: Left Arm)   Pulse 86   Temp 98.2 F (36.8 C) (Oral)   Resp 17   LMP 06/30/2013   SpO2 99%   Visual Acuity Right Eye Distance:   Left Eye Distance:   Bilateral Distance:    Right Eye Near:   Left Eye Near:    Bilateral Near:     Physical Exam Vitals and nursing note reviewed.  Constitutional:      General: She is not in acute distress.    Appearance: She is not ill-appearing.  Abdominal:     General: Bowel sounds are normal.     Palpations: Abdomen is soft.     Tenderness: There is abdominal tenderness in the suprapubic area.     Hernia: No hernia is present.  Neurological:     Mental Status: She is alert.      UC Treatments / Results  Labs (all labs ordered are listed, but only abnormal results are displayed) Labs Reviewed  POCT URINALYSIS DIPSTICK, ED / UC - Abnormal; Notable for the following components:      Result Value   Bilirubin Urine SMALL (*)    Hgb urine dipstick LARGE (*)    Protein, ur >=300 (*)    Leukocytes,Ua TRACE (*)    All other components within normal limits  CBC WITH DIFFERENTIAL/PLATELET    EKG   Radiology No results found.  Procedures Procedures (including critical care time)  Medications Ordered in UC Medications - No data to display  Initial Impression / Assessment and Plan / UC Course  I have reviewed the triage vital signs and the nursing notes.  Pertinent labs & imaging results that were available during my care of  the patient were reviewed by me and considered in my medical decision making (see chart for details).     1.  Painless hematuria: Pyridium as needed for urethral discomfort  after voiding Urine analysis is remarkable for large hemoglobin trace leukocyte esterase. Urine cultures have been sent Antibiotics were sent Urology referral for painless hematuria Patient to be seen by Dr. Claudia Desanctis on 07/23/2020. Return precautions given.  Final Clinical Impressions(s) / UC Diagnoses   Final diagnoses:  Painless hematuria     Discharge Instructions     Please follow-up with a urologist as scheduled If symptoms worsen please return to the urgent care to be reevaluated.   ED Prescriptions    None     PDMP not reviewed this encounter.   Chase Picket, MD 07/21/20 1324

## 2020-07-21 NOTE — ED Triage Notes (Signed)
Pt presents with abdominal pain and blood in urine and frequency xs 2 days.

## 2020-07-21 NOTE — Discharge Instructions (Signed)
Please follow-up with a urologist as scheduled If symptoms worsen please return to the urgent care to be reevaluated.

## 2020-07-22 LAB — URINE CULTURE

## 2020-07-23 DIAGNOSIS — R310 Gross hematuria: Secondary | ICD-10-CM | POA: Diagnosis not present

## 2020-07-28 DIAGNOSIS — N289 Disorder of kidney and ureter, unspecified: Secondary | ICD-10-CM | POA: Diagnosis not present

## 2020-07-28 DIAGNOSIS — N281 Cyst of kidney, acquired: Secondary | ICD-10-CM | POA: Diagnosis not present

## 2020-07-28 DIAGNOSIS — R310 Gross hematuria: Secondary | ICD-10-CM | POA: Diagnosis not present

## 2020-08-05 ENCOUNTER — Other Ambulatory Visit: Payer: Self-pay

## 2020-08-05 ENCOUNTER — Other Ambulatory Visit: Payer: Self-pay | Admitting: Family Medicine

## 2020-08-05 ENCOUNTER — Ambulatory Visit (INDEPENDENT_AMBULATORY_CARE_PROVIDER_SITE_OTHER): Payer: Medicare HMO | Admitting: Family Medicine

## 2020-08-05 ENCOUNTER — Other Ambulatory Visit (INDEPENDENT_AMBULATORY_CARE_PROVIDER_SITE_OTHER): Payer: Medicare HMO

## 2020-08-05 ENCOUNTER — Encounter: Payer: Self-pay | Admitting: Family Medicine

## 2020-08-05 VITALS — BP 130/78 | HR 78 | Temp 97.6°F | Resp 17 | Wt 157.4 lb

## 2020-08-05 DIAGNOSIS — R058 Other specified cough: Secondary | ICD-10-CM | POA: Diagnosis not present

## 2020-08-05 DIAGNOSIS — R350 Frequency of micturition: Secondary | ICD-10-CM | POA: Diagnosis not present

## 2020-08-05 DIAGNOSIS — R82998 Other abnormal findings in urine: Secondary | ICD-10-CM

## 2020-08-05 DIAGNOSIS — R062 Wheezing: Secondary | ICD-10-CM

## 2020-08-05 DIAGNOSIS — R1013 Epigastric pain: Secondary | ICD-10-CM

## 2020-08-05 DIAGNOSIS — R3589 Other polyuria: Secondary | ICD-10-CM

## 2020-08-05 LAB — POCT URINALYSIS DIPSTICK OB
Bilirubin, UA: NEGATIVE
Blood, UA: NEGATIVE
Glucose, UA: NEGATIVE
Ketones, UA: POSITIVE
Nitrite, UA: NEGATIVE
POC,PROTEIN,UA: NEGATIVE
Spec Grav, UA: 1.01 (ref 1.010–1.025)
Urobilinogen, UA: 1 E.U./dL
pH, UA: 6 (ref 5.0–8.0)

## 2020-08-05 MED ORDER — CEPHALEXIN 500 MG PO CAPS: 500.0000 mg | ORAL_CAPSULE | Freq: Two times a day (BID) | ORAL | 0 refills | Status: AC

## 2020-08-05 MED ORDER — ALBUTEROL SULFATE HFA 108 (90 BASE) MCG/ACT IN AERS
2.0000 | INHALATION_SPRAY | Freq: Four times a day (QID) | RESPIRATORY_TRACT | 3 refills | Status: DC | PRN
Start: 2020-08-05 — End: 2020-11-17

## 2020-08-05 MED ORDER — FAMOTIDINE 40 MG PO TABS
40.0000 mg | ORAL_TABLET | Freq: Every day | ORAL | 3 refills | Status: DC
Start: 2020-08-05 — End: 2020-11-28

## 2020-08-05 MED ORDER — SUCRALFATE 1 G PO TABS
1.0000 g | ORAL_TABLET | Freq: Three times a day (TID) | ORAL | 0 refills | Status: DC
Start: 2020-08-05 — End: 2020-11-17

## 2020-08-05 NOTE — Progress Notes (Signed)
Subjective:    [name], female    DOB: 1946/04/23, 74 y.o.   MRN: [id number]  HPI ? UTI- increased urinary frequency, burning, suprapubic pain.  Pt had blood in urine on 10/11 and went to UC.  She had a urology appt on 10/13.  Had cystoscopy- unrevealing.  CT ordered to assess kidneys- 'saw a couple of cysts' and told her to return in 6 months and at that time they would consider MRI.  Bleeding lasted 5 days.  Her frequency and pain started after urology evaluation.  Nocturnal wheeze- pt reports 'my lungs are fine', 'it's in my bronchial tubes'.  Only wheezes at night.  Pt recorded herself.  Stopped Omeprazole- this caused diarrhea.  Pt had normal CXR.  She is worried about mold in the house.  Abd pain- 'all the time'.  Described as a burning pain.  Stopped her omeprazole due to diarrhea.   Review of Systems For ROS see HPI   This visit occurred during the SARS-CoV-2 public health emergency.  Safety protocols were in place, including screening questions prior to the visit, additional usage of staff PPE, and extensive cleaning of exam room while observing appropriate contact time as indicated for disinfecting solutions.       Objective:   Physical Exam Vitals reviewed.  Constitutional:      General: She is not in acute distress.    Appearance: Normal appearance. She is well-developed. She is not ill-appearing.  HENT:     Head: Normocephalic and atraumatic.  Eyes:     Conjunctiva/sclera: Conjunctivae normal.     Pupils: Pupils are equal, round, and reactive to light.  Neck:     Thyroid: No thyromegaly.  Cardiovascular:     Rate and Rhythm: Normal rate and regular rhythm.     Heart sounds: Normal heart sounds. No murmur heard.   Pulmonary:     Effort: Pulmonary effort is normal. No respiratory distress.     Breath sounds: Normal breath sounds. No wheezing or rhonchi.  Abdominal:     General: There is no distension.     Palpations: Abdomen is soft.      Tenderness: There is no abdominal tenderness. There is no guarding or rebound.  Musculoskeletal:     Cervical back: Normal range of motion and neck supple.  Lymphadenopathy:     Cervical: No cervical adenopathy.  Skin:    General: Skin is warm and dry.  Neurological:     Mental Status: She is alert and oriented to person, place, and time.  Psychiatric:        Behavior: Behavior normal.     Comments: anxious           Assessment & Plan:  Urinary frequency- new.  sxs started after her recent cystoscopy.  UA suspicious for infxn.  Will start abx and await culture results.  If sxs don't improve we will need to reach out to Urology.  Wheezing- pt has complained about nocturnal wheezing multiple times.  We have tried to treat PND, GERD but pt states sxs persist.  She has never had wheezing in the office.  Pt is concerned she has mold in her home.  Recent CXR WNL.  Will start albuterol inhaler and refer to pulmonary for complete evaluation and tx.  Epigastric pain- new.  No TTP.  Pt reports 'constant burning'.  She stopped the Omeprazole b/c it caused diarrhea.  She reports pain w/ eating.  Will start Famotidine nightly and Carafate prior  to eating.  If no improvement will need GI referral.  Pt expressed understanding and is in agreement w/ plan.

## 2020-08-05 NOTE — Patient Instructions (Signed)
Follow up as needed or as scheduled START the Cephalexin twice daily- w/ food- to treat the urinary tract infection Drink LOTS of water USE the albuterol inhaler- 2 puffs before bed- to help w/ the wheezing We'll call you with your pulmonary (lung) appt START the Famotidine nightly to improve the stomach pain TAKE the Sucralfate prior to each meal and before bed to help w/ stomach pain Call with any questions or concerns- particularly if the stomach pain isn't improving Hang in here!

## 2020-08-05 NOTE — Progress Notes (Signed)
ponit

## 2020-08-06 LAB — URINE CULTURE
MICRO NUMBER:: 11121086
SPECIMEN QUALITY:: ADEQUATE

## 2020-08-14 ENCOUNTER — Other Ambulatory Visit: Payer: Self-pay | Admitting: Family Medicine

## 2020-08-19 ENCOUNTER — Other Ambulatory Visit: Payer: Self-pay | Admitting: Family Medicine

## 2020-08-19 NOTE — Telephone Encounter (Signed)
LFD 02/25/20 #60 with 3 refills LOV 08/05/20 NOV 11/28/20

## 2020-08-25 ENCOUNTER — Telehealth: Payer: Self-pay

## 2020-08-25 NOTE — Progress Notes (Unsigned)
    Chronic Care Management Pharmacy Assistant   Name: Margaret Torres  MRN: [id number] DOB: 07-27-1946  Reason for Encounter: Medication Review/ Initial Visit  Patient Questions:  1.  Have you seen any other providers since your last visit? {CHL THN UPSTREAM YES/NO:24149}  2.  Any changes in your medicines or health? {CHL THN UPSTREAM YES/NO:24149}   Margaret Torres,  74 y.o. , female presents for their Initial CCM visit with the clinical pharmacist In office.  PCP : Margaret Minium, MD  Allergies:   Allergies  Allergen Reactions  . Influenza Vaccines     Rash, tongue swelling    Medications: Outpatient Encounter Medications as of 08/25/2020  Medication Sig  . clonazePAM (KLONOPIN) 0.5 MG tablet Take 1 tablet by mouth twice daily as needed  . albuterol (VENTOLIN HFA) 108 (90 Base) MCG/ACT inhaler Inhale 2 puffs into the lungs every 6 (six) hours as needed for wheezing or shortness of breath.  Marland Kitchen amLODipine (NORVASC) 10 MG tablet Take 1 tablet by mouth once daily  . famotidine (PEPCID) 40 MG tablet Take 1 tablet (40 mg total) by mouth at bedtime.  . fluticasone (FLONASE) 50 MCG/ACT nasal spray Use 2 spray(s) in each nostril once daily  . meclizine (ANTIVERT) 25 MG tablet Take 1 tablet (25 mg total) by mouth 3 (three) times daily as needed for dizziness.  . sucralfate (CARAFATE) 1 g tablet Take 1 tablet (1 g total) by mouth 4 (four) times daily -  with meals and at bedtime.   No facility-administered encounter medications on file as of 08/25/2020.    Current Diagnosis: Patient Active Problem List   Diagnosis Date Noted  . Primary osteoarthritis of both knees 10/20/2018  . GERD (gastroesophageal reflux disease) 10/12/2018  . Multiple falls 10/12/2018  . Multiple thyroid nodules 12/30/2017  . Hyperthyroidism 04/21/2016  . Bowel incontinence 04/21/2016  . Leg pain, bilateral 03/02/2016  . Dysphagia 03/02/2016  . Olecranon bursitis of left elbow 01/30/2016  . Physical  exam 01/27/2015  . Bilateral renal cysts 01/27/2015  . Left anterior knee pain 09/30/2014  . Insomnia 06/20/2014  . Hemorrhoids, external 03/27/2014  . Anxiety and depression 07/16/2007  . Essential hypertension 07/16/2007  . Allergic rhinitis 07/16/2007  . Hyperlipidemia 06/26/2007  . TAH/BSO, HX OF 06/26/2007    Have you seen any other providers since your last visit? **{YES NO:22349}  Any changes in your medications or health? {YES NO:22349}  Any side effects from any medications? {YES NO:22349}  Do you have an symptoms or problems not managed by your medications? {YES NO:22349}  Any concerns about your health right now? {YES NO:22349}  Has your provider asked that you check blood pressure, blood sugar, or follow special diet at home? {YES NO:22349}  Do you get any type of exercise on a regular basis? {YES NO:22349}  Can you think of a goal you would like to reach for your health? ***  Do you have any problems getting your medications? {YES NO:22349}  Is there anything that you would like to discuss during the appointment? ***  Please bring medications and supplements to appointment   Margaret Torres ,[facility] Clinical Pharmacist Assistant [phone]   Follow-Up:  Pharmacist Review

## 2020-08-27 NOTE — Progress Notes (Signed)
Chronic Care Management Pharmacy  Name: Margaret Torres  MRN: [id number]   DOB: 12/31/1945  Chief Complaint/ HPI Margaret Torres, 74 y.o., female, presents for their initial CCM visit with the clinical pharmacist via telephone due to COVID-19 pandemic .  PCP : Midge Minium, MD Encounter Diagnoses  Name Primary?  . Gastroesophageal reflux disease, unspecified whether esophagitis present Yes  . Essential hypertension   . Hyperlipidemia, unspecified hyperlipidemia type     Office Visits:  08/05/2020 (PCP): frequent urination, epigastric pain. rx for UTI, H2RA for GI symptoms. Pt will be called with pulmonary apt. Drink lots of water. Previous concerns with salt formulations.   Patient Active Problem List   Diagnosis Date Noted  . Primary osteoarthritis of both knees 10/20/2018  . GERD (gastroesophageal reflux disease) 10/12/2018  . Multiple falls 10/12/2018  . Multiple thyroid nodules 12/30/2017  . Hyperthyroidism 04/21/2016  . Bowel incontinence 04/21/2016  . Leg pain, bilateral 03/02/2016  . Dysphagia 03/02/2016  . Olecranon bursitis of left elbow 01/30/2016  . Physical exam 01/27/2015  . Bilateral renal cysts 01/27/2015  . Left anterior knee pain 09/30/2014  . Insomnia 06/20/2014  . Hemorrhoids, external 03/27/2014  . Anxiety and depression 07/16/2007  . Essential hypertension 07/16/2007  . Allergic rhinitis 07/16/2007  . Hyperlipidemia 06/26/2007  . TAH/BSO, HX OF 06/26/2007   Past Surgical History:  Procedure Laterality Date  . ABDOMINAL HYSTERECTOMY    . WISDOM TOOTH EXTRACTION Bilateral    Family History  Problem Relation Age of Onset  . Cervical cancer Mother   . Hypertension Mother   . Hypertension Father   . Heart disease Brother   . Diabetes Brother   . Stroke Maternal Grandmother   . Cancer Other   . Diabetes Sister   . Stroke Sister   . Cancer Sister        bone cancer  . Breast cancer Sister   . Heart attack Son   . Thyroid disease Neg Hx      Allergies  Allergen Reactions  . Influenza Vaccines     Rash, tongue swelling   Outpatient Encounter Medications as of 08/28/2020  Medication Sig  . amLODipine (NORVASC) 10 MG tablet Take 1 tablet by mouth once daily  . aspirin 325 MG tablet Take 162.5 mg by mouth daily.  . clonazePAM (KLONOPIN) 0.5 MG tablet Take 1 tablet by mouth twice daily as needed  . famotidine (PEPCID) 40 MG tablet Take 1 tablet (40 mg total) by mouth at bedtime.  . fluticasone (FLONASE) 50 MCG/ACT nasal spray Use 2 spray(s) in each nostril once daily  . meclizine (ANTIVERT) 25 MG tablet Take 1 tablet (25 mg total) by mouth 3 (three) times daily as needed for dizziness.  . sucralfate (CARAFATE) 1 g tablet Take 1 tablet (1 g total) by mouth 4 (four) times daily -  with meals and at bedtime.  Marland Kitchen albuterol (VENTOLIN HFA) 108 (90 Base) MCG/ACT inhaler Inhale 2 puffs into the lungs every 6 (six) hours as needed for wheezing or shortness of breath. (Patient not taking: Reported on 08/28/2020)   No facility-administered encounter medications on file as of 08/28/2020.   Patient Care Team    Relationship Specialty Notifications Start End  Midge Minium, MD PCP - General Family Medicine  03/27/14   Elayne Snare, MD Consulting Physician Endocrinology  05/13/16   Carol Ada, MD Consulting Physician Gastroenterology  05/13/16   Madelin Rear, [facility] Pharmacist Pharmacist  07/15/20    Comment: [phone]   Current  Diagnosis/Assessment: Goals Addressed            This Visit's Progress   . PharmD Care Plan       CARE PLAN ENTRY (see longitudinal plan of care for additional care plan information)  Current Barriers:  . Chronic Disease Management support, education, and care coordination needs related to Hypertension, Hyperlipidemia, and GERD   Hypertension BP Readings from Last 3 Encounters:  08/05/20 130/78  07/21/20 (!) 147/83  05/28/20 116/78   . Pharmacist Clinical Goal(s): o Over the next 365 days, patient  will work with PharmD and providers to maintain BP goal <130/80 . Current regimen:  o Amlodipine 10 mg once daily . Interventions: o We discussed diet and exercise extensively. Maintain a healthy weight and exercise regularly. Eat healthy foods, such as: Lean proteins, complex carbohydrates, fresh fruits and vegetables, healthy fats. . Patient self care activities - Over the next 365 days, patient will: o Check BP daily, document, and provide at future appointments o Ensure daily salt intake < 2300 mg/day  Hyperlipidemia Lab Results  Component Value Date/Time   LDLCALC 102 (H) 05/28/2020 10:47 AM   . Pharmacist Clinical Goal(s): o Over the next 365 days, patient will work with PharmD and providers to achieve LDL goal < 100 . Current regimen:  o N/a - diet and exercise alone . Interventions: o We discussed diet and exercise extensively. Maintain a healthy weight and exercise regularly. Eat healthy foods, such as: Lean proteins, complex carbohydrates, fresh fruits and vegetables, healthy fats. . Patient self care activities - Over the next 365 days, patient will: o Continue current management  GERD (acid reflux) . Pharmacist Clinical Goal(s) o Over the next 365 days, patient will work with PharmD and providers to minimize acid reflux symptoms . Current regimen:  o Famotidine 40 mg once daily at night o Sucralfate with meals and at bedtime . Interventions: o Review triggers of acid reflux including spicy foods, tomato sauce, fried foods . Patient self care activities - Over the next 365 days, patient will: o Interior and spatial designer with any medication related questions  Medication management . Pharmacist Clinical Goal(s): o Over the next 365 days, patient will work with PharmD and providers to maintain optimal medication adherence . Current pharmacy: [facility] . Interventions o Comprehensive medication review performed. o Continue current medication management strategy . Patient self  care activities - Over the next 365 days, patient will: o Take medications as prescribed o Report any questions or concerns to PharmD and/or provider(s) Initial goal documentation .      Hypertension   BP goal <161/09 basic metabolic panel BMP Latest Ref Rng & Units 05/28/2020 11/26/2019 04/30/2019  Glucose 70 - 99 mg/dL 85 90 85  BUN 6 - 23 mg/dL '15 12 16  ' Creatinine 0.40 - 1.20 mg/dL 1.18 0.94 0.98  Sodium 135 - 145 mEq/L 138 138 138  Potassium 3.5 - 5.1 mEq/L 4.4 4.5 3.8  Chloride 96 - 112 mEq/L 103 104 106  CO2 19 - 32 mEq/L '27 28 23  ' Calcium 8.4 - 10.5 mg/dL 9.6 9.4 9.2   BP Readings from Last 3 Encounters:  08/05/20 130/78  07/21/20 (!) 147/83  05/28/20 116/78   Previous medications: n/a. Patient checks BP at home every morning. Recent home readings: 130s/70s at home. Reports routinely eating fruits/vegetables, typically eats 2 meals per day. Patient is currently at goal on the following medications:  . Amlodipine 10 mg once daily  We discussed diet and exercise extensively. Maintain a  healthy weight and exercise regularly. Eat healthy foods, such as: Lean proteins, complex carbohydrates, fresh fruits and vegetables, healthy fats.  Plan  Continue current medications.  Hyperlipidemia   LDL goal < 100  Lipid Panel     Component Value Date/Time   CHOL 171 05/28/2020 1047   TRIG 88.0 05/28/2020 1047   HDL 51.10 05/28/2020 1047   LDLCALC 102 (H) 05/28/2020 1047    Hepatic Function Latest Ref Rng & Units 05/28/2020 11/26/2019 04/30/2019  Total Protein 6.0 - 8.3 g/dL 7.0 6.9 6.8  Albumin 3.5 - 5.2 g/dL 4.3 4.3 4.5  AST 0 - 37 U/L '17 19 22  ' ALT 0 - 35 U/L '17 24 24  ' Alk Phosphatase 39 - 117 U/L 70 72 61  Total Bilirubin 0.2 - 1.2 mg/dL 0.6 0.5 0.6  Bilirubin, Direct 0.0 - 0.3 mg/dL 0.1 0.1 0.1    The 10-year ASCVD risk score Mikey Bussing DC Jr., et al., 2013) is: 13.3%   Values used to calculate the score:     Age: 53 years     Sex: Female     Is Non-Hispanic African  American: Yes     Diabetic: No     Tobacco smoker: No     Systolic Blood Pressure: 817 mmHg     Is BP treated: Yes     HDL Cholesterol: 51.1 mg/dL     Total Cholesterol: 171 mg/dL   Patient is currently at goal the following medications:  . n/a  Plan  Continue current medications.  GERD   Patient denies recent acid reflux. Bloating/gas from famotidine. Previous meds: omeprazole, pantoprazole, zantac. Currently controlled on: . Sucralfate 1 g four times daily with meals and at bedtime - taking 2 times daily and at bed . Famotidine 40 mg once daily at bedtime  We discussed: Avoidance of potential triggers such as alcohol, fatty foods, lying down after eating, and tomato sauce.  Plan   Continue current medications.  Anxiety    Denies side effects, reports ongoing benefit of taking medication. Patient is currently controlled on the following medications:  . Clonazepam 0.5 mg twice daily   Plan  Continue current medications.  Medication Management / Care Coordination   Receives prescription medications from:  [facility] ([address] [place] [address] [place] [address] Phone: [phone] Fax: [phone]   Denies any issues with current medication management.   Plan  Continue current medication management strategy. ___________________________ SDOH (Social Determinants of Health) assessments performed: Yes.  Future Appointments  Date Time Provider [place]  09/08/2020 10:30 AM Laurin Coder, MD [facility] None  11/28/2020 10:30 AM Midge Minium, MD [facility]  08/27/2021  1:30 PM [facility] PHARMACIST [facility]   Visit follow-up:  . CPA follow-up: 6 month BP. Marland Kitchen RPH follow-up: 12 month telephone visit. Review PAP needs/med assistance.   Madelin Rear, Pharm.D., BCGP Clinical Pharmacist [facility] [phone]

## 2020-08-28 ENCOUNTER — Ambulatory Visit: Payer: Medicare HMO

## 2020-08-28 ENCOUNTER — Other Ambulatory Visit: Payer: Self-pay

## 2020-08-28 DIAGNOSIS — K219 Gastro-esophageal reflux disease without esophagitis: Secondary | ICD-10-CM

## 2020-08-28 DIAGNOSIS — E785 Hyperlipidemia, unspecified: Secondary | ICD-10-CM

## 2020-08-28 DIAGNOSIS — I10 Essential (primary) hypertension: Secondary | ICD-10-CM

## 2020-08-28 NOTE — Patient Instructions (Signed)
Please review care plan below and call me at [phone] with any questions!  Thank you, Edyth Gunnels., Clinical Pharmacist  Goals Addressed            This Visit's Progress   . PharmD Care Plan       CARE PLAN ENTRY (see longitudinal plan of care for additional care plan information)  Current Barriers:  . Chronic Disease Management support, education, and care coordination needs related to Hypertension, Hyperlipidemia, and GERD   Hypertension BP Readings from Last 3 Encounters:  08/05/20 130/78  07/21/20 (!) 147/83  05/28/20 116/78   . Pharmacist Clinical Goal(s): o Over the next 365 days, patient will work with PharmD and providers to maintain BP goal <130/80 . Current regimen:  o Amlodipine 10 mg once daily . Interventions: o We discussed diet and exercise extensively. Maintain a healthy weight and exercise regularly. Eat healthy foods, such as: Lean proteins, complex carbohydrates, fresh fruits and vegetables, healthy fats. . Patient self care activities - Over the next 365 days, patient will: o Check BP daily, document, and provide at future appointments o Ensure daily salt intake < 2300 mg/day  Hyperlipidemia Lab Results  Component Value Date/Time   LDLCALC 102 (H) 05/28/2020 10:47 AM   . Pharmacist Clinical Goal(s): o Over the next 365 days, patient will work with PharmD and providers to achieve LDL goal < 100 . Current regimen:  o N/a - diet and exercise alone . Interventions: o We discussed diet and exercise extensively. Maintain a healthy weight and exercise regularly. Eat healthy foods, such as: Lean proteins, complex carbohydrates, fresh fruits and vegetables, healthy fats. . Patient self care activities - Over the next 365 days, patient will: o Continue current management  GERD (acid reflux) . Pharmacist Clinical Goal(s) o Over the next 365 days, patient will work with PharmD and providers to minimize acid reflux symptoms . Current regimen:  o Famotidine  40 mg once daily at night o Sucralfate with meals and at bedtime . Interventions: o Review triggers of acid reflux including spicy foods, tomato sauce, fried foods . Patient self care activities - Over the next 365 days, patient will: o Interior and spatial designer with any medication related questions  Medication management . Pharmacist Clinical Goal(s): o Over the next 365 days, patient will work with PharmD and providers to maintain optimal medication adherence . Current pharmacy: [facility] . Interventions o Comprehensive medication review performed. o Continue current medication management strategy . Patient self care activities - Over the next 365 days, patient will: o Take medications as prescribed o Report any questions or concerns to PharmD and/or provider(s) Initial goal documentation .      The patient verbalized understanding of instructions provided today and agreed to receive a mailed copy of patient instruction and/or educational materials. Telephone follow up appointment with pharmacy team member scheduled for: See next appointment with "Care Management Staff" under "What's Next" below.   Madelin Rear, Pharm.D., BCGP Clinical Pharmacist [facility] [phone]  Hypertension, Adult High blood pressure (hypertension) is when the force of blood pumping through the arteries is too strong. The arteries are the blood vessels that carry blood from the heart throughout the body. Hypertension forces the heart to work harder to pump blood and may cause arteries to become narrow or stiff. Untreated or uncontrolled hypertension can cause a heart attack, heart failure, a stroke, kidney disease, and other problems. A blood pressure reading consists of a higher number over a lower number. Ideally, your  blood pressure should be below 120/80. The first ("top") number is called the systolic pressure. It is a measure of the pressure in your arteries as your heart beats. The second  ("bottom") number is called the diastolic pressure. It is a measure of the pressure in your arteries as the heart relaxes. What are the causes? The exact cause of this condition is not known. There are some conditions that result in or are related to high blood pressure. What increases the risk? Some risk factors for high blood pressure are under your control. The following factors may make you more likely to develop this condition:  Smoking.  Having type 2 diabetes mellitus, high cholesterol, or both.  Not getting enough exercise or physical activity.  Being overweight.  Having too much fat, sugar, calories, or salt (sodium) in your diet.  Drinking too much alcohol. Some risk factors for high blood pressure may be difficult or impossible to change. Some of these factors include:  Having chronic kidney disease.  Having a family history of high blood pressure.  Age. Risk increases with age.  Race. You may be at higher risk if you are African American.  Gender. Men are at higher risk than women before age 11. After age 4, women are at higher risk than men.  Having obstructive sleep apnea.  Stress. What are the signs or symptoms? High blood pressure may not cause symptoms. Very high blood pressure (hypertensive crisis) may cause:  Headache.  Anxiety.  Shortness of breath.  Nosebleed.  Nausea and vomiting.  Vision changes.  Severe chest pain.  Seizures. How is this diagnosed? This condition is diagnosed by measuring your blood pressure while you are seated, with your arm resting on a flat surface, your legs uncrossed, and your feet flat on the floor. The cuff of the blood pressure monitor will be placed directly against the skin of your upper arm at the level of your heart. It should be measured at least twice using the same arm. Certain conditions can cause a difference in blood pressure between your right and left arms. Certain factors can cause blood pressure  readings to be lower or higher than normal for a short period of time:  When your blood pressure is higher when you are in a health care provider's office than when you are at home, this is called white coat hypertension. Most people with this condition do not need medicines.  When your blood pressure is higher at home than when you are in a health care provider's office, this is called masked hypertension. Most people with this condition may need medicines to control blood pressure. If you have a high blood pressure reading during one visit or you have normal blood pressure with other risk factors, you may be asked to:  Return on a different day to have your blood pressure checked again.  Monitor your blood pressure at home for 1 week or longer. If you are diagnosed with hypertension, you may have other blood or imaging tests to help your health care provider understand your overall risk for other conditions. How is this treated? This condition is treated by making healthy lifestyle changes, such as eating healthy foods, exercising more, and reducing your alcohol intake. Your health care provider may prescribe medicine if lifestyle changes are not enough to get your blood pressure under control, and if:  Your systolic blood pressure is above 130.  Your diastolic blood pressure is above 80. Your personal target blood pressure may vary  depending on your medical conditions, your age, and other factors. Follow these instructions at home: Eating and drinking   Eat a diet that is high in fiber and potassium, and low in sodium, added sugar, and fat. An example eating plan is called the DASH (Dietary Approaches to Stop Hypertension) diet. To eat this way: ? Eat plenty of fresh fruits and vegetables. Try to fill one half of your plate at each meal with fruits and vegetables. ? Eat whole grains, such as whole-wheat pasta, Vi rice, or whole-grain bread. Fill about one fourth of your plate with whole  grains. ? Eat or drink low-fat dairy products, such as skim milk or low-fat yogurt. ? Avoid fatty cuts of meat, processed or cured meats, and poultry with skin. Fill about one fourth of your plate with lean proteins, such as fish, chicken without skin, beans, eggs, or tofu. ? Avoid pre-made and processed foods. These tend to be higher in sodium, added sugar, and fat.  Reduce your daily sodium intake. Most people with hypertension should eat less than 1,500 mg of sodium a day.  Do not drink alcohol if: ? Your health care provider tells you not to drink. ? You are pregnant, may be pregnant, or are planning to become pregnant.  If you drink alcohol: ? Limit how much you use to:  0-1 drink a day for women.  0-2 drinks a day for men. ? Be aware of how much alcohol is in your drink. In the [place] one drink equals one 12 oz bottle of beer (355 mL), one 5 oz glass of wine (148 mL), or one 1 oz glass of hard liquor (44 mL). Lifestyle   Work with your health care provider to maintain a healthy body weight or to lose weight. Ask what an ideal weight is for you.  Get at least 30 minutes of exercise most days of the week. Activities may include walking, swimming, or biking.  Include exercise to strengthen your muscles (resistance exercise), such as Pilates or lifting weights, as part of your weekly exercise routine. Try to do these types of exercises for 30 minutes at least 3 days a week.  Do not use any products that contain nicotine or tobacco, such as cigarettes, e-cigarettes, and chewing tobacco. If you need help quitting, ask your health care provider.  Monitor your blood pressure at home as told by your health care provider.  Keep all follow-up visits as told by your health care provider. This is important. Medicines  Take over-the-counter and prescription medicines only as told by your health care provider. Follow directions carefully. Blood pressure medicines must be taken as  prescribed.  Do not skip doses of blood pressure medicine. Doing this puts you at risk for problems and can make the medicine less effective.  Ask your health care provider about side effects or reactions to medicines that you should watch for. Contact a health care provider if you:  Think you are having a reaction to a medicine you are taking.  Have headaches that keep coming back (recurring).  Feel dizzy.  Have swelling in your ankles.  Have trouble with your vision. Get help right away if you:  Develop a severe headache or confusion.  Have unusual weakness or numbness.  Feel faint.  Have severe pain in your chest or abdomen.  Vomit repeatedly.  Have trouble breathing. Summary  Hypertension is when the force of blood pumping through your arteries is too strong. If this condition is not controlled,  it may put you at risk for serious complications.  Your personal target blood pressure may vary depending on your medical conditions, your age, and other factors. For most people, a normal blood pressure is less than 120/80.  Hypertension is treated with lifestyle changes, medicines, or a combination of both. Lifestyle changes include losing weight, eating a healthy, low-sodium diet, exercising more, and limiting alcohol. This information is not intended to replace advice given to you by your health care provider. Make sure you discuss any questions you have with your health care provider. Document Revised: 06/07/2018 Document Reviewed: 06/07/2018 Elsevier Patient Education  2020 [employer].

## 2020-09-08 ENCOUNTER — Ambulatory Visit (INDEPENDENT_AMBULATORY_CARE_PROVIDER_SITE_OTHER): Payer: Medicare HMO | Admitting: Pulmonary Disease

## 2020-09-08 ENCOUNTER — Encounter: Payer: Self-pay | Admitting: Pulmonary Disease

## 2020-09-08 ENCOUNTER — Other Ambulatory Visit: Payer: Self-pay

## 2020-09-08 VITALS — BP 130/82 | HR 74 | Temp 97.8°F | Ht 64.5 in | Wt 157.6 lb

## 2020-09-08 DIAGNOSIS — R062 Wheezing: Secondary | ICD-10-CM

## 2020-09-08 DIAGNOSIS — G478 Other sleep disorders: Secondary | ICD-10-CM | POA: Diagnosis not present

## 2020-09-08 NOTE — Addendum Note (Signed)
Addended by: Luanna Salk on: 09/08/2020 11:14 AM   Modules accepted: Orders

## 2020-09-08 NOTE — Progress Notes (Signed)
Margaret Torres    [id number]    Feb 20, 1946  Primary Care Physician:Tabori, Aundra Millet, MD  Referring Physician: Midge Torres, Margaret [name]  Margaret Torres [id number]  Chief complaint:  Patient seen for wheezing at night  HPI:  He actually played a recording of wheezing This usually happens when she is home Sometimes worse whenever the HVAC system comes on-here and comes on  No history of water damage in the house No pets No new Pets  No past history of asthma  Sleep is nonrestorative because she wakes up multiple times for incontinence Usually goes to bed at 8 PM wakes up at 6 AM Weight is stable Has multiple reasons contributing to not being able to sleep well at night  Denies prior history of snoring  Never smoker  She stated her reflux symptoms well controlled Denies any burning sensation  Outpatient Encounter Medications as of 09/08/2020  Medication Sig  . albuterol (VENTOLIN HFA) 108 (90 Base) MCG/ACT inhaler Inhale 2 puffs into the lungs every 6 (six) hours as needed for wheezing or shortness of breath.  Marland Kitchen amLODipine (NORVASC) 10 MG tablet Take 1 tablet by mouth once daily  . aspirin 325 MG tablet Take 162.5 mg by mouth daily.  . clonazePAM (KLONOPIN) 0.5 MG tablet Take 1 tablet by mouth twice daily as needed  . famotidine (PEPCID) 40 MG tablet Take 1 tablet (40 mg total) by mouth at bedtime.  . fluticasone (FLONASE) 50 MCG/ACT nasal spray Use 2 spray(s) in each nostril once daily  . meclizine (ANTIVERT) 25 MG tablet Take 1 tablet (25 mg total) by mouth 3 (three) times daily as needed for dizziness.  . sucralfate (CARAFATE) 1 g tablet Take 1 tablet (1 g total) by mouth 4 (four) times daily -  with meals and at bedtime.   No facility-administered encounter medications on file as of 09/08/2020.    Allergies as of 09/08/2020 - Review Complete 09/08/2020  Allergen Reaction Noted  . Influenza vaccines  09/01/2015    Past Medical  History:  Diagnosis Date  . Anxiety   . Cervical cancer ([place])   . Depression   . Hypertension     Past Surgical History:  Procedure Laterality Date  . ABDOMINAL HYSTERECTOMY    . WISDOM TOOTH EXTRACTION Bilateral     Family History  Problem Relation Age of Onset  . Cervical cancer Mother   . Hypertension Mother   . Hypertension Father   . Heart disease Brother   . Diabetes Brother   . Stroke Maternal Grandmother   . Cancer Other   . Diabetes Sister   . Stroke Sister   . Cancer Sister        bone cancer  . Breast cancer Sister   . Heart attack Son   . Thyroid disease Neg Hx     Social History   Socioeconomic History  . Marital status: Divorced    Spouse name: Not on file  . Number of children: 3  . Years of education: Not on file  . Highest education level: Not on file  Occupational History  . Occupation: Retired   Tobacco Use  . Smoking status: Never Smoker  . Smokeless tobacco: Never Used  Substance and Sexual Activity  . Alcohol use: No  . Drug use: No  . Sexual activity: Not Currently    Birth control/protection: Post-menopausal  Other Topics Concern  . Not on file  Social History Narrative   Hobbies: enjoys fishing    1 Daughter that lives locally    1 Son lives in [place]    3 [place]    1 Son deceased    Social Determinants of Health   Financial Resource Strain:   . Difficulty of Paying Living Expenses: Not on file  Food Insecurity: No Food Insecurity  . Worried About Charity fundraiser in the Last Year: Never true  . Ran Out of Food in the Last Year: Never true  Transportation Needs: No Transportation Needs  . Lack of Transportation (Medical): No  . Lack of Transportation (Non-Medical): No  Physical Activity:   . Days of Exercise per Week: Not on file  . Minutes of Exercise per Session: Not on file  Stress:   . Feeling of Stress : Not on file  Social Connections:   . Frequency of Communication with Friends and Family: Not on file  .  Frequency of Social Gatherings with Friends and Family: Not on file  . Attends Religious Services: Not on file  . Active Member of Clubs or Organizations: Not on file  . Attends Archivist Meetings: Not on file  . Marital Status: Not on file  Intimate Partner Violence:   . Fear of Current or Ex-Partner: Not on file  . Emotionally Abused: Not on file  . Physically Abused: Not on file  . Sexually Abused: Not on file    Review of Systems  Constitutional: Positive for fatigue.  Respiratory: Positive for wheezing.   Psychiatric/Behavioral: Positive for sleep disturbance.    Vitals:   09/08/20 1037  BP: 130/82  Pulse: 74  Temp: 97.8 F (36.6 C)  SpO2: 98%     Physical Exam Constitutional:      Appearance: Normal appearance.  HENT:     Nose: No congestion or rhinorrhea.     Mouth/Throat:     Mouth: Mucous membranes are moist.     Pharynx: No oropharyngeal exudate or posterior oropharyngeal erythema.  Eyes:     General:        Right eye: No discharge.        Left eye: No discharge.  Cardiovascular:     Rate and Rhythm: Normal rate and regular rhythm.     Heart sounds: No murmur heard.  No friction rub.  Pulmonary:     Effort: Pulmonary effort is normal. No respiratory distress.     Breath sounds: Normal breath sounds. No stridor. No wheezing or rhonchi.  Musculoskeletal:     Cervical back: No rigidity or tenderness.  Neurological:     Mental Status: She is alert.  Psychiatric:        Mood and Affect: Mood normal.    Data Reviewed: Chest x-ray performed 04/22/2020 reviewed showing no acute infiltrate-reviewed by myself  Assessment:  Wheezing, shortness of breath - usually occurs when she is in a home environment -Noted worsening whenever he heating system comes on -Improves when she is outdoors  Reflux symptoms well controlled currently on current medications  Prescribed albuterol which she has not tried yet   History is not suggestive of sleep  disordered breathing  Plan/Recommendations: We will obtain a pulmonary function test  Encouraged to try albuterol to see if it helps  Continue management for reflux  I will see her back in about 6 weeks  Encouraged her to get the home checked for mold, have HVAC system serviced  Multiple factors likely contributing to nonrestorative sleep  Sleep study not indicated at present   Sherrilyn Rist MD [facility] 09/08/2020, 11:00 AM  CC: Margaret Minium, MD

## 2020-09-08 NOTE — Patient Instructions (Signed)
Have somebody check the home for mold  Deep cleaning of your HVAC system  Fill the prescription for albuterol  Schedule you for breathing study  Follow-up in 6 weeks  Call with significant concerns

## 2020-09-10 ENCOUNTER — Telehealth: Payer: Self-pay | Admitting: Pulmonary Disease

## 2020-09-10 NOTE — Telephone Encounter (Signed)
ATC patient unable to reach LM to call back office (x1)  Did leave detailed message on machine stating that Dr. Birdie Riddle ordered her 4 inhalers in October and she might want to follow up with her office as she sent them into the preferred pharmacy requested.

## 2020-09-15 ENCOUNTER — Other Ambulatory Visit: Payer: Self-pay | Admitting: Family Medicine

## 2020-09-16 NOTE — Telephone Encounter (Signed)
Called and spoke to pt. Pt states she was told by the pharmacy that she could not get the albuterol since she didn't get it filled back in October. [facility] [address] and spoke to [place]. He states he can fill the albuterol hfa now and will be about $27.   Called and spoke to pt. Informed her of the information. Pt states she cannot afford the $27 but is going to call the pharmacy and insurance to see if something can be done to reduce the price. Pt states she will call back if she is unable to get the albuterol inhaler. Will close encounter.

## 2020-09-29 ENCOUNTER — Other Ambulatory Visit: Payer: Self-pay | Admitting: Family Medicine

## 2020-09-30 ENCOUNTER — Other Ambulatory Visit: Payer: Self-pay | Admitting: Family Medicine

## 2020-10-22 ENCOUNTER — Ambulatory Visit: Payer: Medicare HMO | Admitting: Acute Care

## 2020-11-02 ENCOUNTER — Other Ambulatory Visit: Payer: Self-pay | Admitting: Family Medicine

## 2020-11-03 NOTE — Telephone Encounter (Signed)
Requesting:Klonopin 0.5mg  Contract: UDS: Last Visit:08/05/20 Next Visit:11/28/20 Last Refill:09/29/20 60 tabs 0 refills Please Advise

## 2020-11-05 ENCOUNTER — Telehealth: Payer: Self-pay

## 2020-11-05 NOTE — Chronic Care Management (AMB) (Signed)
Chronic Care Management Pharmacy Assistant   Name: Miangel Flom  MRN: [id number] DOB: 08-06-1946  Reason for Encounter: Disease State/ General Adherence Call   PCP : Midge Minium, MD  Allergies:   Allergies  Allergen Reactions  . Influenza Vaccines     Rash, tongue swelling    Medications: Outpatient Encounter Medications as of 11/05/2020  Medication Sig  . clonazePAM (KLONOPIN) 0.5 MG tablet Take 1 tablet by mouth twice daily as needed  . albuterol (VENTOLIN HFA) 108 (90 Base) MCG/ACT inhaler Inhale 2 puffs into the lungs every 6 (six) hours as needed for wheezing or shortness of breath.  Marland Kitchen amLODipine (NORVASC) 10 MG tablet Take 1 tablet by mouth once daily  . aspirin 325 MG tablet Take 162.5 mg by mouth daily.  . famotidine (PEPCID) 40 MG tablet Take 1 tablet (40 mg total) by mouth at bedtime.  . fluticasone (FLONASE) 50 MCG/ACT nasal spray Use 2 spray(s) in each nostril once daily  . meclizine (ANTIVERT) 25 MG tablet Take 1 tablet (25 mg total) by mouth 3 (three) times daily as needed for dizziness.  . sucralfate (CARAFATE) 1 g tablet Take 1 tablet (1 g total) by mouth 4 (four) times daily -  with meals and at bedtime.   No facility-administered encounter medications on file as of 11/05/2020.    Current Diagnosis: Patient Active Problem List   Diagnosis Date Noted  . Primary osteoarthritis of both knees 10/20/2018  . GERD (gastroesophageal reflux disease) 10/12/2018  . Multiple falls 10/12/2018  . Multiple thyroid nodules 12/30/2017  . Hyperthyroidism 04/21/2016  . Bowel incontinence 04/21/2016  . Leg pain, bilateral 03/02/2016  . Dysphagia 03/02/2016  . Olecranon bursitis of left elbow 01/30/2016  . Physical exam 01/27/2015  . Bilateral renal cysts 01/27/2015  . Left anterior knee pain 09/30/2014  . Insomnia 06/20/2014  . Hemorrhoids, external 03/27/2014  . Anxiety and depression 07/16/2007  . Essential hypertension 07/16/2007  . Allergic rhinitis  07/16/2007  . Hyperlipidemia 06/26/2007  . TAH/BSO, HX OF 06/26/2007    Have you seen any other providers since your last visit with Madelin Rear, Pharm.D., BCGP?  09/08/2020 [facility], Alden Server, MD, Wheezing.  Have you had any problems recently with your health?  Patient states she has been making strange sounds while breathing at night after she had her 2nd COVID vaccine. Patient states she does not have any trouble breathing, she only makes strange sounds. Patient states she does not get enough sleep due to these sounds.. Patient states she has been running an air purifier and goes outside daily for fresh air. Patient states nothing seems to help her strange sounds. Patient states she also can not "hold her bladder". Patient states she has to get up ~5-6 times during the night to urinate. Patient states she has to wear pads. Patient states she is unsure if this is normal due to her age or if there is anything she can do to help her frequent urination at night.  Have you had any problems with your pharmacy?  Patient states she has not had any problems with her pharmacy.  What issues or side effects are you having with your medications?  Patient states medication sucralfate causes her to have bad stomach cramps.  What would you like me to pass along to Madelin Rear, [place].D., BCGP for them to help you with?   Patient states she would like for me to pass along her recent health and medication problems as  mentioned above.  What can we do to take care of you better?  Patient states "I wish I knew".   April D Calhoun, [place] Pharmacist Assistant [phone]    Follow-Up:  Pharmacist Review

## 2020-11-13 ENCOUNTER — Telehealth: Payer: Self-pay

## 2020-11-13 ENCOUNTER — Other Ambulatory Visit (HOSPITAL_COMMUNITY)
Admission: RE | Admit: 2020-11-13 | Discharge: 2020-11-13 | Disposition: A | Discharge status: Home / Self Care | Payer: Medicare HMO | Source: Ambulatory Visit | Attending: Acute Care | Admitting: Acute Care

## 2020-11-13 DIAGNOSIS — Z01812 Encounter for preprocedural laboratory examination: Secondary | ICD-10-CM | POA: Insufficient documentation

## 2020-11-13 DIAGNOSIS — Z20822 Contact with and (suspected) exposure to covid-19: Secondary | ICD-10-CM | POA: Insufficient documentation

## 2020-11-13 LAB — SARS CORONAVIRUS 2 (TAT 6-24 HRS): SARS Coronavirus 2: NEGATIVE

## 2020-11-13 MED ORDER — CLONAZEPAM 0.5 MG PO TABS: 0.5000 mg | ORAL_TABLET | Freq: Two times a day (BID) | ORAL | 1 refills | Status: DC | PRN

## 2020-11-13 NOTE — Chronic Care Management (AMB) (Signed)
    Chronic Care Management Pharmacy Assistant   Name: Margaret Torres  MRN: [id number] DOB: 03-18-1946  Patient called back stating [facility] won't fill her Rx for Clonazepam without an early fill approval from Dr. Birdie Riddle. Please advise  April D Calhoun, [place] Pharmacist Assistant [phone]

## 2020-11-13 NOTE — Telephone Encounter (Signed)
Can you call her pharmacy and ask if they are able to do a special order for this medication? They usually just need the [facility] [id number] to place this order through their distributor. If they cannot place this order or honor her request, we can ask pt if she would be interested in switching pharmacies to upstream pharmacy and see if they can help with the request.   h  The famotidine shouldn't be contributing to elevated bp. When is patient taking BP readings and BP medication. Make sure she is checking at least 2 hours after BP med. Have her check daily over the next week and keep log and we can check back in on her. Next week

## 2020-11-13 NOTE — Telephone Encounter (Signed)
Spoke with pharmacy, confirmed this medication was authorized today.

## 2020-11-13 NOTE — Telephone Encounter (Signed)
Pt called in stating that the [facility] on [address] has the medication she needs. She states she is at the pharmacy now we can we send this in for her?   Please advise

## 2020-11-13 NOTE — Telephone Encounter (Cosign Needed)
I called and spoke with the patient, she is aware new Rx sent for Clonazepam. Patient is also aware of instructions to check her BP  as noted: Make sure she is checking at least 2 hours after BP med. Have her check daily over the next week and keep log and we can check back in on her. Next week  April D Calhoun, [place] Pharmacist Assistant [phone]

## 2020-11-13 NOTE — Telephone Encounter (Signed)
New prescription sent to preferred pharmacy for pt.

## 2020-11-13 NOTE — Chronic Care Management (AMB) (Signed)
    Chronic Care Management Pharmacy Assistant   Name: Margaret Torres  MRN: [id number] DOB: December 16, 1945  Reason for Encounter: Patient call   PCP : Midge Minium, MD  Allergies:   Allergies  Allergen Reactions  . Influenza Vaccines     Rash, tongue swelling    Medications: Outpatient Encounter Medications as of 11/13/2020  Medication Sig  . clonazePAM (KLONOPIN) 0.5 MG tablet Take 1 tablet by mouth twice daily as needed  . albuterol (VENTOLIN HFA) 108 (90 Base) MCG/ACT inhaler Inhale 2 puffs into the lungs every 6 (six) hours as needed for wheezing or shortness of breath.  Marland Kitchen amLODipine (NORVASC) 10 MG tablet Take 1 tablet by mouth once daily  . aspirin 325 MG tablet Take 162.5 mg by mouth daily.  . famotidine (PEPCID) 40 MG tablet Take 1 tablet (40 mg total) by mouth at bedtime.  . fluticasone (FLONASE) 50 MCG/ACT nasal spray Use 2 spray(s) in each nostril once daily  . meclizine (ANTIVERT) 25 MG tablet Take 1 tablet (25 mg total) by mouth 3 (three) times daily as needed for dizziness.  . sucralfate (CARAFATE) 1 g tablet Take 1 tablet (1 g total) by mouth 4 (four) times daily -  with meals and at bedtime.   No facility-administered encounter medications on file as of 11/13/2020.    Current Diagnosis: Patient Active Problem List   Diagnosis Date Noted  . Primary osteoarthritis of both knees 10/20/2018  . GERD (gastroesophageal reflux disease) 10/12/2018  . Multiple falls 10/12/2018  . Multiple thyroid nodules 12/30/2017  . Hyperthyroidism 04/21/2016  . Bowel incontinence 04/21/2016  . Leg pain, bilateral 03/02/2016  . Dysphagia 03/02/2016  . Olecranon bursitis of left elbow 01/30/2016  . Physical exam 01/27/2015  . Bilateral renal cysts 01/27/2015  . Left anterior knee pain 09/30/2014  . Insomnia 06/20/2014  . Hemorrhoids, external 03/27/2014  . Anxiety and depression 07/16/2007  . Essential hypertension 07/16/2007  . Allergic rhinitis 07/16/2007  . Hyperlipidemia  06/26/2007  . TAH/BSO, HX OF 06/26/2007    Patient called stating that she is having issues/side effects from her medications.   Patient states the "yellow pill, TEVA of my Clonazepam makes me have heart palpitations". Patient states only the brand TEVA makes her "feel more anxious" , "it makes me feel like I've been running". Patient states her pulse was 105. Patient states she can take a pink pill manufactured through [employer] of Clonazepam and will not experience any side effects. Patient states she has communicated this side effect with her pharmacy [facility] on [address]. Patient states although she has asked to not be given this brand of medication, they continue to dispense it to her.  Patient states medication Famotidine (PEDCID) raises her blood pressure. Patient states at first it was only slightly elevated, the more she takes the medication the higher her blood pressure goes. Patient states she checks her blood pressure every morning and every night. Patient states "I don't want problems with my blood pressure".  Please Advise  April D Calhoun, [place] Pharmacist Assistant [phone]   Follow-Up:  Pharmacist Review

## 2020-11-17 ENCOUNTER — Ambulatory Visit (INDEPENDENT_AMBULATORY_CARE_PROVIDER_SITE_OTHER): Payer: Medicare HMO | Admitting: Pulmonary Disease

## 2020-11-17 ENCOUNTER — Ambulatory Visit (INDEPENDENT_AMBULATORY_CARE_PROVIDER_SITE_OTHER): Payer: Medicare HMO

## 2020-11-17 ENCOUNTER — Ambulatory Visit: Payer: Medicare HMO | Admitting: Primary Care

## 2020-11-17 ENCOUNTER — Ambulatory Visit: Payer: Medicare HMO | Admitting: Acute Care

## 2020-11-17 ENCOUNTER — Other Ambulatory Visit: Payer: Self-pay

## 2020-11-17 ENCOUNTER — Encounter: Payer: Self-pay | Admitting: Primary Care

## 2020-11-17 VITALS — BP 118/70 | HR 50 | Temp 97.2°F | Ht 64.5 in | Wt 155.0 lb

## 2020-11-17 DIAGNOSIS — R062 Wheezing: Secondary | ICD-10-CM

## 2020-11-17 HISTORY — DX: Wheezing: R06.2

## 2020-11-17 LAB — PULMONARY FUNCTION TEST
DL/VA % pred: 117 %
DL/VA: 4.81 ml/min/mmHg/L
DLCO cor % pred: 78 %
DLCO cor: 15.15 ml/min/mmHg
DLCO unc % pred: 78 %
DLCO unc: 15.15 ml/min/mmHg
FEF 25-75 Post: 2.03 L/sec
FEF 25-75 Pre: 1.9 L/sec
FEF2575-%Change-Post: 7 %
FEF2575-%Pred-Post: 135 %
FEF2575-%Pred-Pre: 126 %
FEV1-%Change-Post: 3 %
FEV1-%Pred-Post: 100 %
FEV1-%Pred-Pre: 97 %
FEV1-Post: 1.72 L
FEV1-Pre: 1.67 L
FEV1FVC-%Change-Post: -1 %
FEV1FVC-%Pred-Pre: 110 %
FEV6-%Change-Post: 4 %
FEV6-%Pred-Post: 97 %
FEV6-%Pred-Pre: 93 %
FEV6-Post: 2.08 L
FEV6-Pre: 1.98 L
FEV6FVC-%Pred-Post: 104 %
FEV6FVC-%Pred-Pre: 104 %
FVC-%Change-Post: 4 %
FVC-%Pred-Post: 93 %
FVC-%Pred-Pre: 89 %
FVC-Post: 2.08 L
FVC-Pre: 1.98 L
Post FEV1/FVC ratio: 83 %
Post FEV6/FVC ratio: 100 %
Pre FEV1/FVC ratio: 84 %
Pre FEV6/FVC Ratio: 100 %
RV % pred: 149 %
RV: 3.49 L
TLC % pred: 120 %
TLC: 6.19 L

## 2020-11-17 LAB — POCT EXHALED NITRIC OXIDE: FeNO level (ppb): 99

## 2020-11-17 MED ORDER — BREO ELLIPTA 100-25 MCG/INH IN AEPB: 1.0000 | INHALATION_SPRAY | Freq: Every day | RESPIRATORY_TRACT | 0 refills | Status: DC

## 2020-11-17 NOTE — Progress Notes (Signed)
@Patient  ID: Margaret Torres, female    DOB: Feb 08, 1946, 75 y.o.   MRN: [id number]  Chief Complaint  Patient presents with  . Follow-up    PFT done today for wheezing  and non restorative sleep    Referring provider: Midge Minium, MD  HPI: 75 year old female, never smoked.  Past medical history significant for hypertension, dysphagia, GERD, allergic rhinitis, hypothyroidism, multiple thyroid nodules, anxiety/depression, insomnia.  Patient of Dr. Burnett Harry, seen for initial consult on 11/29/2 for wheezing. No hx asthma. Wheezing and shortness of breath occur in home environment. Ordered for PFTs and given prescription for albuterol prn.   11/17/2020 Patient presents today for 6 week follow-up with PFTs. She continues to experience wheezing. She first noticed this in June 2021 after receiving covid vaccine. Wheezing mainly occurs in her home. She has a video recording playing of this today. She has not tried using prescribed Albuterol inhaler. States that she has an "allergy cough", she gets up a lot of phlegm. She has associated post nasal drip. She uses Flonase every day. She is not taking an antihistamine. She takes mucinex and cloricidin as needed. She is currently on famotidine but she feels this elevates her blood pressure. She has no pets. She is not aware of any mold. Her house was built in 1958. Denies f/c/s, chest tightness or shortness of breath.   Pulmonary function testing: 11/17/2020 - FVC 2.08 (93%), FEV1 1.72 (100%), ratio 83, TLC 120%,RV 149%,  DLCOunc 15.15 (78%) Normal spirometry and diffusion capacity. No BD response. Evidence of air trapping.   11/17/2020 FENO- 99  Allergies  Allergen Reactions  . Influenza Vaccines     Rash, tongue swelling    Immunization History  Administered Date(s) Administered  . Moderna Sars-Covid-2 Vaccination 02/28/2020, 03/30/2020  . Pneumococcal Polysaccharide-23 03/27/2013    Past Medical History:  Diagnosis Date  . Anxiety   .  Cervical cancer ([place])   . Depression   . Hypertension     Tobacco History: Social History   Tobacco Use  Smoking Status Never Smoker  Smokeless Tobacco Never Used   Counseling given: Not Answered   Outpatient Medications Prior to Visit  Medication Sig Dispense Refill  . amLODipine (NORVASC) 10 MG tablet Take 1 tablet by mouth once daily 90 tablet 0  . aspirin 325 MG tablet Take 162.5 mg by mouth daily.    . clonazePAM (KLONOPIN) 0.5 MG tablet Take 1 tablet (0.5 mg total) by mouth 2 (two) times daily as needed. 60 tablet 1  . famotidine (PEPCID) 40 MG tablet Take 1 tablet (40 mg total) by mouth at bedtime. 30 tablet 3  . fluticasone (FLONASE) 50 MCG/ACT nasal spray Use 2 spray(s) in each nostril once daily 16 g 0  . meclizine (ANTIVERT) 25 MG tablet Take 1 tablet (25 mg total) by mouth 3 (three) times daily as needed for dizziness. 30 tablet 0  . albuterol (VENTOLIN HFA) 108 (90 Base) MCG/ACT inhaler Inhale 2 puffs into the lungs every 6 (six) hours as needed for wheezing or shortness of breath. (Patient not taking: Reported on 11/17/2020) 8 g 3  . sucralfate (CARAFATE) 1 g tablet Take 1 tablet (1 g total) by mouth 4 (four) times daily -  with meals and at bedtime. (Patient not taking: Reported on 11/17/2020) 90 tablet 0   No facility-administered medications prior to visit.   Review of Systems  Review of Systems  Constitutional: Negative.   HENT: Positive for postnasal drip.   Respiratory:  Positive for wheezing. Negative for cough, chest tightness and shortness of breath.   Gastrointestinal: Negative.    Physical Exam  BP 118/70   Pulse (!) 50   Temp (!) 97.2 F (36.2 C) (Tympanic)   Ht 5' 4.5" (1.638 m)   Wt 155 lb (70.3 kg)   LMP 06/30/2013   SpO2 97%   BMI 26.19 kg/m  Physical Exam Constitutional:      Appearance: Normal appearance.  HENT:     Mouth/Throat:     Comments: Deferred d/t masking Pulmonary:     Effort: No respiratory distress.     Breath sounds: No  stridor.     Comments: Faint expiratory wheezing upper lobes Neurological:     General: No focal deficit present.     Mental Status: She is alert and oriented to person, place, and time. Mental status is at baseline.  Psychiatric:        Mood and Affect: Mood normal.        Behavior: Behavior normal.        Thought Content: Thought content normal.        Judgment: Judgment normal.      Lab Results:  CBC    Component Value Date/Time   WBC 7.7 07/21/2020 1211   RBC 3.85 (L) 07/21/2020 1211   HGB 11.7 (L) 07/21/2020 1211   HCT 34.3 (L) 07/21/2020 1211   PLT 314 07/21/2020 1211   MCV 89.1 07/21/2020 1211   MCH 30.4 07/21/2020 1211   MCHC 34.1 07/21/2020 1211   RDW 13.0 07/21/2020 1211   LYMPHSABS 2.3 07/21/2020 1211   MONOABS 0.4 07/21/2020 1211   EOSABS 0.2 07/21/2020 1211   BASOSABS 0.1 07/21/2020 1211    BMET    Component Value Date/Time   NA 138 05/28/2020 1047   K 4.4 05/28/2020 1047   CL 103 05/28/2020 1047   CO2 27 05/28/2020 1047   GLUCOSE 85 05/28/2020 1047   BUN 15 05/28/2020 1047   CREATININE 1.18 05/28/2020 1047   CALCIUM 9.6 05/28/2020 1047   GFRNONAA >60 03/01/2015 0633   GFRAA >60 03/01/2015 0633    BNP No results found for: BNP  ProBNP No results found for: PROBNP  Imaging: No results found.   Assessment & Plan:   Wheezing - Patient complains of wheezing since June 2021. No associated cough or shortness of breath. Spirometry and diffusion capacity are normal, no BD response. Evidence of air trapping. FENO was elevated 99. We will check CXR and CBC with diff/resp allergy panel. High suspicion for allergic asthma, although patient does not agree. Recommend patient start sample BREO 100 and Loratidine 10mg  daily. Continue Flonase nasal spray and famotidine for PND and GERD control.   Recommendations:   - Start BREO 100 - take 1 puff every day in the morning x 2 weeks (if this helps we will send in prescription) - Start taking over the counter  Claritin  - Continues Flonase nasal spray - Continue Famotidine at bedtime - Use albuterol inhaler 2 puffs every 6 hours for wheezing to see if this helps   Follow-up: - 1 month with Dr. Ander Slade (March)   Martyn Ehrich, NP 11/17/2020

## 2020-11-17 NOTE — Patient Instructions (Addendum)
Pulmonary function testing today looked normal Your FENO was elevated indicating that wheezing could be coming from asthma We will check another CXR and some addirtional labs to see if an allergen is causing your symptoms  Recommendations:   - Start BREO - take 1 puff every day in the morning x 2 weeks (if this helps we will send in prescription) - Start taking over the counter Claritin  - Continues Flonase nasal spray - Continue Famotidine at bedtime - Use albuterol inhaler 2 puffs every 6 hours for wheezing to see if this helps   Follow-up: - 1 month with Dr. Ander Slade (March)

## 2020-11-17 NOTE — Assessment & Plan Note (Addendum)
-   Patient complains of wheezing since June 2021. No associated cough or shortness of breath. Spirometry and diffusion capacity are normal, no BD response. Evidence of air trapping. FENO was elevated 99. We will check CXR and CBC with diff/resp allergy panel. High suspicion for allergic asthma, although patient does not agree. Recommend patient start sample BREO 100 and Loratidine 10mg  daily. Continue Flonase nasal spray and famotidine for PND and GERD control.   Recommendations:   - Start BREO 100 - take 1 puff every day in the morning x 2 weeks (if this helps we will send in prescription) - Start taking over the counter Claritin  - Continues Flonase nasal spray - Continue Famotidine at bedtime - Use albuterol inhaler 2 puffs every 6 hours for wheezing to see if this helps   Follow-up: - 1 month with Dr. Ander Slade (March)

## 2020-11-17 NOTE — Progress Notes (Signed)
Full PFT performed today. °

## 2020-11-17 NOTE — Addendum Note (Signed)
Addended by: Elie Confer on: 11/17/2020 12:29 PM   Modules accepted: Orders

## 2020-11-18 NOTE — Progress Notes (Signed)
Please let patient know CXR showed evidence of bronchitis, no pneumonia. Can you please send in Doxycycline 100mg  twice daily x 7 days.

## 2020-11-19 ENCOUNTER — Encounter: Payer: Self-pay | Admitting: *Deleted

## 2020-11-25 ENCOUNTER — Telehealth: Payer: Self-pay | Admitting: Pulmonary Disease

## 2020-11-25 NOTE — Telephone Encounter (Signed)
Please let patient know CXR showed evidence of bronchitis, no pneumonia. Can you please send in Doxycycline 100mg  twice daily x 7 days.   I have called and LM on VM for the pt to call us back about her cxr results listed above.

## 2020-11-26 ENCOUNTER — Encounter: Payer: Self-pay | Admitting: Pulmonary Disease

## 2020-11-26 ENCOUNTER — Other Ambulatory Visit: Payer: Self-pay | Admitting: Family Medicine

## 2020-11-26 MED ORDER — DOXYCYCLINE HYCLATE 100 MG PO TABS: 100.0000 mg | ORAL_TABLET | Freq: Two times a day (BID) | ORAL | 0 refills | Status: DC

## 2020-11-26 NOTE — Telephone Encounter (Signed)
11/26/20  Called patient and spoke with her.  Provided results from Geraldo Pitter NP regarding 11/18/2020 chest x-ray.  Prescription sent for doxycycline.  Patient does not have allergy to doxycycline.  Reviewed with patient to take antibiotic with food.  Patient aware to avoid long periods of time in direct sunlight without sunscreen while taking doxycycline.  Patient is aware to contact our office if symptoms are not improving or if they are worsening.  Nothing further needed at this time.  Wyn Quaker, FNP

## 2020-11-26 NOTE — Telephone Encounter (Signed)
Patient is returning phone call. Patient phone number is [phone] h or [phone] c.

## 2020-11-28 ENCOUNTER — Encounter: Payer: Self-pay | Admitting: Family Medicine

## 2020-11-28 ENCOUNTER — Ambulatory Visit (INDEPENDENT_AMBULATORY_CARE_PROVIDER_SITE_OTHER): Payer: Medicare HMO | Admitting: Family Medicine

## 2020-11-28 ENCOUNTER — Other Ambulatory Visit: Payer: Self-pay

## 2020-11-28 VITALS — BP 142/80 | HR 62 | Temp 98.0°F | Resp 20 | Ht 64.0 in | Wt 155.4 lb

## 2020-11-28 DIAGNOSIS — K640 First degree hemorrhoids: Secondary | ICD-10-CM

## 2020-11-28 DIAGNOSIS — R1084 Generalized abdominal pain: Secondary | ICD-10-CM

## 2020-11-28 DIAGNOSIS — M25512 Pain in left shoulder: Secondary | ICD-10-CM | POA: Diagnosis not present

## 2020-11-28 DIAGNOSIS — K219 Gastro-esophageal reflux disease without esophagitis: Secondary | ICD-10-CM | POA: Diagnosis not present

## 2020-11-28 DIAGNOSIS — K641 Second degree hemorrhoids: Secondary | ICD-10-CM | POA: Insufficient documentation

## 2020-11-28 DIAGNOSIS — I10 Essential (primary) hypertension: Secondary | ICD-10-CM | POA: Diagnosis not present

## 2020-11-28 DIAGNOSIS — R195 Other fecal abnormalities: Secondary | ICD-10-CM | POA: Insufficient documentation

## 2020-11-28 DIAGNOSIS — R079 Chest pain, unspecified: Secondary | ICD-10-CM

## 2020-11-28 DIAGNOSIS — K625 Hemorrhage of anus and rectum: Secondary | ICD-10-CM | POA: Insufficient documentation

## 2020-11-28 DIAGNOSIS — G4700 Insomnia, unspecified: Secondary | ICD-10-CM

## 2020-11-28 DIAGNOSIS — R3915 Urgency of urination: Secondary | ICD-10-CM

## 2020-11-28 HISTORY — DX: First degree hemorrhoids: K64.0

## 2020-11-28 HISTORY — DX: Second degree hemorrhoids: K64.1

## 2020-11-28 HISTORY — DX: Hemorrhage of anus and rectum: K62.5

## 2020-11-28 HISTORY — DX: Generalized abdominal pain: R10.84

## 2020-11-28 LAB — CBC WITH DIFFERENTIAL/PLATELET
Basophils Absolute: 0.1 10*3/uL (ref 0.0–0.1)
Basophils Relative: 1.1 % (ref 0.0–3.0)
Eosinophils Absolute: 0.2 10*3/uL (ref 0.0–0.7)
Eosinophils Relative: 3.2 % (ref 0.0–5.0)
HCT: 37.2 % (ref 36.0–46.0)
Hemoglobin: 12.3 g/dL (ref 12.0–15.0)
Lymphocytes Relative: 29.6 % (ref 12.0–46.0)
Lymphs Abs: 1.9 10*3/uL (ref 0.7–4.0)
MCHC: 33 g/dL (ref 30.0–36.0)
MCV: 90.6 fl (ref 78.0–100.0)
Monocytes Absolute: 0.5 10*3/uL (ref 0.1–1.0)
Monocytes Relative: 7.3 % (ref 3.0–12.0)
Neutro Abs: 3.8 10*3/uL (ref 1.4–7.7)
Neutrophils Relative %: 58.8 % (ref 43.0–77.0)
Platelets: 286 10*3/uL (ref 150.0–400.0)
RBC: 4.11 Mil/uL (ref 3.87–5.11)
RDW: 13.6 % (ref 11.5–15.5)
WBC: 6.5 10*3/uL (ref 4.0–10.5)

## 2020-11-28 LAB — POCT URINALYSIS DIPSTICK OB
Bilirubin, UA: NEGATIVE
Glucose, UA: NEGATIVE
Ketones, UA: NEGATIVE
Leukocytes, UA: NEGATIVE
Nitrite, UA: NEGATIVE
POC,PROTEIN,UA: NEGATIVE
Spec Grav, UA: 1.01 (ref 1.010–1.025)
Urobilinogen, UA: 0.2 E.U./dL
pH, UA: 6.5 (ref 5.0–8.0)

## 2020-11-28 LAB — BASIC METABOLIC PANEL
BUN: 19 mg/dL (ref 6–23)
CO2: 27 mEq/L (ref 19–32)
Calcium: 9.5 mg/dL (ref 8.4–10.5)
Chloride: 103 mEq/L (ref 96–112)
Creatinine, Ser: 0.96 mg/dL (ref 0.40–1.20)
GFR: 57.66 mL/min — ABNORMAL LOW (ref >60.00)
Glucose, Bld: 91 mg/dL (ref 70–99)
Potassium: 3.7 mEq/L (ref 3.5–5.1)
Sodium: 137 mEq/L (ref 135–145)

## 2020-11-28 LAB — HEPATIC FUNCTION PANEL
ALT: 24 U/L (ref 0–35)
AST: 24 U/L (ref 0–37)
Albumin: 4.5 g/dL (ref 3.5–5.2)
Alkaline Phosphatase: 69 U/L (ref 39–117)
Bilirubin, Direct: 0.1 mg/dL (ref 0.0–0.3)
Total Bilirubin: 0.5 mg/dL (ref 0.2–1.2)
Total Protein: 7.6 g/dL (ref 6.0–8.3)

## 2020-11-28 LAB — LIPID PANEL
Cholesterol: 171 mg/dL (ref 0–200)
HDL: 53.2 mg/dL (ref >39.00)
LDL Cholesterol: 103 mg/dL — ABNORMAL HIGH (ref 0–99)
NonHDL: 117.52
Total CHOL/HDL Ratio: 3
Triglycerides: 72 mg/dL (ref 0.0–149.0)
VLDL: 14.4 mg/dL (ref 0.0–40.0)

## 2020-11-28 LAB — TSH: TSH: 2.09 u[IU]/mL (ref 0.35–4.50)

## 2020-11-28 MED ORDER — TRAZODONE HCL 50 MG PO TABS: 25.0000 mg | ORAL_TABLET | Freq: Every evening | ORAL | 3 refills | Status: DC | PRN

## 2020-11-28 NOTE — Progress Notes (Signed)
Subjective:    [name], female    DOB: October 22, 1945, 75 y.o.   MRN: [id number]  HPI HTN- chronic problem, on Amlodipine 10mg  daily.  Was well controlled at Pulmonary on 2/7 but is mildly elevated today.  At times she reports HR will be elevated- 'the other day it was 105'.  Ongoing pulmonary work up for wheezing.  Denies CP.  Denies SOB.  No edema.  GERD- chronic problem, on Pepcid 40mg  daily.  Pt reports Pepcid elevates BP and 'it brings me down'.  Pt reports mood is better when she doesn't take Famotidine.  Pt wants to stop medication, 'I feel better without it'.  Pt reports Famotidine is not effective.  Ate spaghetti yesterday 'and it didn't do nothing'.  Insomnia- pt reports no improvement w/ melatonin.  'I may sleep for 2 hrs and then I'm laying there with my mind racing'.  L shoulder/arm pain- 'it's always sore and tender'.  Pt reports pain is worse at night.  'it's a nagging ache'.  Says L breast feels heavy compared to R- not interested in diagnostic mammo.  'I worry that it's my heart'.  Had multiple CXRs recently- no obvious cause.  sxs started 3-4 weeks ago.  Pain w/ overhead motion or reaching for things.   Review of Systems For ROS see HPI   This visit occurred during the SARS-CoV-2 public health emergency.  Safety protocols were in place, including screening questions prior to the visit, additional usage of staff PPE, and extensive cleaning of exam room while observing appropriate contact time as indicated for disinfecting solutions.       Objective:   Physical Exam Vitals reviewed.  Constitutional:      General: She is not in acute distress.    Appearance: Normal appearance. She is not ill-appearing.  HENT:     Head: Normocephalic and atraumatic.  Eyes:     Extraocular Movements: Extraocular movements intact.     Conjunctiva/sclera: Conjunctivae normal.     Pupils: Pupils are equal, round, and reactive to light.  Cardiovascular:     Rate and Rhythm:  Normal rate. Rhythm irregular.     Pulses: Normal pulses.     Heart sounds: Normal heart sounds. No murmur heard.   Pulmonary:     Effort: Pulmonary effort is normal. No respiratory distress.     Breath sounds: Normal breath sounds. No stridor. No wheezing, rhonchi or rales.  Chest:     Chest wall: No tenderness.  Breasts:     Left: Normal. No swelling, skin change or tenderness.    Musculoskeletal:        General: No swelling.     Cervical back: Normal range of motion and neck supple.     Comments: Pain w/ internal rotation of L shoulder, negative impingement signs.  Pain w/ overhead motion.  Lymphadenopathy:     Cervical: No cervical adenopathy.  Skin:    General: Skin is warm and dry.     Findings: No lesion or rash.  Neurological:     General: No focal deficit present.     Mental Status: She is alert and oriented to person, place, and time.  Psychiatric:     Comments: anxious           Assessment & Plan:  L shoulder pain- new.  Pt actually describes pain as L sided- neck, shoulder, breast, chest wall, arm.  No known injury.  States it aches all the time.  Suspect she may  have some cervical DDD contributing to her pain.  Will refer to ortho for complete evaluation and tx.  L chest pain- new.  Pt reports she has chest discomfort and L breast heaviness.  Mammo was normal in August 2021 and she is not interested in diagnostic imaging today.  EKG WNL w/ exception of occasional premature beats.  Her pain does not sound cardiac in nature and I suspect this is more musculoskeletal given her neck, shoulder, and arm pain.  No need for cardiac work up at this time.  Will follow.

## 2020-11-28 NOTE — Assessment & Plan Note (Signed)
Ongoing issue for pt.  Hx of good control on Amlodipine 10mg  daily.  BP was WNL at pulmonary on 2/7 but is mildly elevated today.  Denies CP or SOB.  Feels famotidine elevates BP.  Will stop Famotidine and follow BP closely.  Pt expressed understanding and is in agreement w/ plan.

## 2020-11-28 NOTE — Patient Instructions (Signed)
Follow up in 1 month to recheck insomnia We'll notify you of your lab results and make any changes if needed STOP the Famotidine Make sure you limit the amount of spicy foods, acidic foods (tomato sauce, salsa, citrus) to help with your reflux START the Trazodone- 1/2 tab nightly for sleep We'll call you with your orthopedic appt for your shoulder Call with any questions or concerns Hang in there!

## 2020-11-28 NOTE — Assessment & Plan Note (Signed)
Ongoing issue for pt.  Again reviewed dietary changes that would improve her sxs as she is not helping herself with her current diet.  She feels Famotidine elevates BP and causes depression.  Doesn't feel it's effective when she does take it.  Will stop Famotidine and monitor sxs as pt may need a different controller medication.

## 2020-11-28 NOTE — Assessment & Plan Note (Signed)
Ongoing issue for pt.  No relief w/ Melatonin 10mg .  I suspect her insomnia is caused largely by her anxiety.  We have tried to treat her anxiety in the past but she has been resistant and inconsistent w/ tx plans.  Will start low dose Trazodone and monitor for improvement.  Pt expressed understanding and is in agreement w/ plan.

## 2020-11-29 LAB — URINE CULTURE
MICRO NUMBER:: 11552498
Result:: NO GROWTH
SPECIMEN QUALITY:: ADEQUATE

## 2020-12-02 DIAGNOSIS — H5203 Hypermetropia, bilateral: Secondary | ICD-10-CM | POA: Diagnosis not present

## 2020-12-05 ENCOUNTER — Telehealth: Payer: Self-pay

## 2020-12-05 NOTE — Chronic Care Management (AMB) (Signed)
    Chronic Care Management Pharmacy Assistant   Name: Margaret Torres  MRN: [id number] DOB: 10-Nov-1945  Reason for Encounter: Chart Review  PCP : Midge Minium, MD  Allergies:   Allergies  Allergen Reactions  . Influenza Vaccines     Rash, tongue swelling    Medications: Outpatient Encounter Medications as of 12/05/2020  Medication Sig  . amLODipine (NORVASC) 10 MG tablet Take 1 tablet by mouth once daily  . aspirin 325 MG tablet Take 162.5 mg by mouth daily.  . clonazePAM (KLONOPIN) 0.5 MG tablet Take 1 tablet (0.5 mg total) by mouth 2 (two) times daily as needed.  . doxycycline (VIBRA-TABS) 100 MG tablet Take 1 tablet (100 mg total) by mouth 2 (two) times daily.  . fluticasone (FLONASE) 50 MCG/ACT nasal spray Use 2 spray(s) in each nostril once daily  . fluticasone furoate-vilanterol (BREO ELLIPTA) 100-25 MCG/INH AEPB Inhale 1 puff into the lungs daily. (Patient not taking: Reported on 11/28/2020)  . meclizine (ANTIVERT) 25 MG tablet Take 1 tablet (25 mg total) by mouth 3 (three) times daily as needed for dizziness.  . traZODone (DESYREL) 50 MG tablet Take 0.5-1 tablets (25-50 mg total) by mouth at bedtime as needed for sleep.   No facility-administered encounter medications on file as of 12/05/2020.    Current Diagnosis: Patient Active Problem List   Diagnosis Date Noted  . Abnormal feces 11/28/2020  . First degree hemorrhoids 11/28/2020  . Generalized abdominal pain 11/28/2020  . Second degree hemorrhoids 11/28/2020  . Hemorrhage of rectum and anus 11/28/2020  . Wheezing 11/17/2020  . Primary osteoarthritis of both knees 10/20/2018  . GERD (gastroesophageal reflux disease) 10/12/2018  . Multiple falls 10/12/2018  . Multiple thyroid nodules 12/30/2017  . Diarrhea 09/20/2016  . Hyperthyroidism 04/21/2016  . Bowel incontinence 04/21/2016  . Leg pain, bilateral 03/02/2016  . Dysphagia 03/02/2016  . Olecranon bursitis of left elbow 01/30/2016  . Physical exam  01/27/2015  . Bilateral renal cysts 01/27/2015  . Left anterior knee pain 09/30/2014  . Insomnia 06/20/2014  . Hemorrhoids, external 03/27/2014  . Anxiety and depression 07/16/2007  . Essential hypertension 07/16/2007  . Allergic rhinitis 07/16/2007  . Hyperlipidemia 06/26/2007  . TAH/BSO, HX OF 06/26/2007    Reviewed chart for medication changes ahead of medication coordination call.  11/17/2020 OV (pulmonology) Geraldo Pitter, NP; wheezing, spirometry and diffusion capacity are normal, high suspicion for allergic asthma, start sample BREO 100 and Loratadine 10 mg daily. Continue Flonase nasal spray and famotidine for PND and GERD.  11/28/2020 OV PCP Dr. Birdie Riddle; will stop Famotidine as patient feels it elevates BP, Start Trazodone 1/2 tab nightly for sleep, ortho referral for left shoulder pain.  Future Appointments  Date Time Provider [place]  12/16/2020 10:30 AM Laurin Coder, MD [facility] None  12/24/2020 10:00 AM Hilts, Legrand Como, MD [facility] None  08/27/2021  1:30 PM [facility] PHARMACIST [facility]    April D Calhoun, [place] Pharmacist Assistant [phone]   Follow-Up:  Pharmacist Review

## 2020-12-10 ENCOUNTER — Other Ambulatory Visit: Payer: Self-pay | Admitting: Urology

## 2020-12-10 ENCOUNTER — Other Ambulatory Visit (HOSPITAL_COMMUNITY): Payer: Self-pay | Admitting: Urology

## 2020-12-10 DIAGNOSIS — N281 Cyst of kidney, acquired: Secondary | ICD-10-CM

## 2020-12-16 ENCOUNTER — Encounter: Payer: Self-pay | Admitting: Pulmonary Disease

## 2020-12-16 ENCOUNTER — Other Ambulatory Visit: Payer: Self-pay

## 2020-12-16 ENCOUNTER — Ambulatory Visit: Payer: Medicare HMO | Admitting: Pulmonary Disease

## 2020-12-16 VITALS — BP 122/80 | HR 58 | Temp 97.7°F | Ht 64.5 in | Wt 157.4 lb

## 2020-12-16 DIAGNOSIS — R062 Wheezing: Secondary | ICD-10-CM

## 2020-12-16 NOTE — Progress Notes (Signed)
Margaret Torres    [id number]    1946/08/30  Primary Care Physician:Tabori, Margaret Millet, MD  Referring Physician: Midge Minium, MD [name] [place],  [place] [address]  Chief complaint:  Patient seen for wheezing at night She is feeling better  HPI:  Wheezing continues to improve Did not initiate Breo  She is currently using Flonase and Claritin Did not have significant problems when she was away and when she came back home she noticed a little bit of wheezing Overall better  No history of water damage in the house No pets No new Pets  No past history of asthma  Sleep is nonrestorative because she wakes up multiple times for incontinence Usually goes to bed at 8 PM wakes up at 6 AM Weight is stable Has multiple reasons contributing to not being able to sleep well at night  Denies prior history of snoring  Never smoker  She stated her reflux symptoms well controlled Denies any burning sensation  Outpatient Encounter Medications as of 12/16/2020  Medication Sig  . amLODipine (NORVASC) 10 MG tablet Take 1 tablet by mouth once daily  . aspirin 325 MG tablet Take 162.5 mg by mouth daily.  . clonazePAM (KLONOPIN) 0.5 MG tablet Take 1 tablet (0.5 mg total) by mouth 2 (two) times daily as needed.  . doxycycline (VIBRA-TABS) 100 MG tablet Take 1 tablet (100 mg total) by mouth 2 (two) times daily.  . fluticasone (FLONASE) 50 MCG/ACT nasal spray Use 2 spray(s) in each nostril once daily  . fluticasone furoate-vilanterol (BREO ELLIPTA) 100-25 MCG/INH AEPB Inhale 1 puff into the lungs daily.  . meclizine (ANTIVERT) 25 MG tablet Take 1 tablet (25 mg total) by mouth 3 (three) times daily as needed for dizziness.  . traZODone (DESYREL) 50 MG tablet Take 0.5-1 tablets (25-50 mg total) by mouth at bedtime as needed for sleep.   No facility-administered encounter medications on file as of 12/16/2020.    Allergies as of 12/16/2020 - Review Complete 12/16/2020   Allergen Reaction Noted  . Influenza vaccines  09/01/2015    Past Medical History:  Diagnosis Date  . Anxiety   . Cervical cancer ([place])   . Depression   . Hypertension     Past Surgical History:  Procedure Laterality Date  . ABDOMINAL HYSTERECTOMY    . WISDOM TOOTH EXTRACTION Bilateral     Family History  Problem Relation Age of Onset  . Cervical cancer Mother   . Hypertension Mother   . Hypertension Father   . Heart disease Brother   . Diabetes Brother   . Stroke Maternal Grandmother   . Cancer Other   . Diabetes Sister   . Stroke Sister   . Cancer Sister        bone cancer  . Breast cancer Sister   . Heart attack Son   . Thyroid disease Neg Hx     Social History   Socioeconomic History  . Marital status: Divorced    Spouse name: Not on file  . Number of children: 3  . Years of education: Not on file  . Highest education level: Not on file  Occupational History  . Occupation: Retired   Tobacco Use  . Smoking status: Never Smoker  . Smokeless tobacco: Never Used  Substance and Sexual Activity  . Alcohol use: No  . Drug use: No  . Sexual activity: Not Currently    Birth control/protection: Post-menopausal  Other Topics Concern  . Not on file  Social History Narrative   Hobbies: enjoys fishing    1 Daughter that lives locally    1 Son lives in [place]    3 [place]    1 Son deceased    Social Determinants of Health   Financial Resource Strain: Not on file  Food Insecurity: No Food Insecurity  . Worried About Charity fundraiser in the Last Year: Never true  . Ran Out of Food in the Last Year: Never true  Transportation Needs: No Transportation Needs  . Lack of Transportation (Medical): No  . Lack of Transportation (Non-Medical): No  Physical Activity: Not on file  Stress: Not on file  Social Connections: Not on file  Intimate Partner Violence: Not on file    Review of Systems  Constitutional: Positive for fatigue.  Respiratory: Positive  for wheezing.   Psychiatric/Behavioral: Positive for sleep disturbance.    Vitals:   12/16/20 1035  BP: 122/80  Pulse: (!) 58  Temp: 97.7 F (36.5 C)  SpO2: 100%     Physical Exam Constitutional:      Appearance: Normal appearance.  HENT:     Nose: No congestion or rhinorrhea.     Mouth/Throat:     Mouth: Mucous membranes are moist.     Pharynx: No oropharyngeal exudate or posterior oropharyngeal erythema.  Eyes:     General:        Right eye: No discharge.        Left eye: No discharge.  Cardiovascular:     Rate and Rhythm: Normal rate and regular rhythm.     Heart sounds: No murmur heard. No friction rub.  Pulmonary:     Effort: Pulmonary effort is normal. No respiratory distress.     Breath sounds: Normal breath sounds. No stridor. No wheezing or rhonchi.  Musculoskeletal:     Cervical back: No rigidity or tenderness.  Neurological:     Mental Status: She is alert.    Data Reviewed: Chest x-ray performed 04/22/2020 reviewed showing no acute infiltrate-reviewed by myself PFT from 11/17/2020-within normal limits  Assessment:  Wheezing, shortness of breath -Continues to improve -She never really initiated Breo- -She does use Flonase and Claritin  Reflux symptoms well controlled currently on current medications  Albuterol as needed   Plan/Recommendations:  Continue Flonase and Claritin  Fair attention to triggers  No changes need made at the present time  I will see you back in a year  You are welcome to call if you have any significant problems   Sherrilyn Rist MD [facility] 12/16/2020, 10:41 AM  CC: Midge Minium, MD

## 2020-12-16 NOTE — Patient Instructions (Addendum)
You do not need to start the [facility]  Your breathing study from 11/17/2020 was within normal limits  Continue with Flonase and Claritin as prescribed  Call us if you are having any significant problems  I will see you a year from now, you are welcome to call if you are having any problems

## 2020-12-23 ENCOUNTER — Other Ambulatory Visit: Payer: Self-pay

## 2020-12-23 ENCOUNTER — Encounter: Payer: Self-pay | Admitting: Family Medicine

## 2020-12-23 ENCOUNTER — Telehealth (INDEPENDENT_AMBULATORY_CARE_PROVIDER_SITE_OTHER): Payer: Medicare HMO | Admitting: Family Medicine

## 2020-12-23 DIAGNOSIS — N281 Cyst of kidney, acquired: Secondary | ICD-10-CM

## 2020-12-23 NOTE — Progress Notes (Signed)
I connected with  Margaret Torres on 12/23/20 by a video enabled telemedicine application and verified that I am speaking with the correct person using two identifiers.   I discussed the limitations of evaluation and management by telemedicine. The patient expressed understanding and agreed to proceed.

## 2020-12-23 NOTE — Progress Notes (Signed)
Virtual Visit via Video   I connected with patient on 12/23/20 at  1:30 PM EDT by a video enabled telemedicine application and verified that I am speaking with the correct person using two identifiers.  Location patient: Home Location provider: [facility], Office Persons participating in the virtual visit: Patient, Provider, [place] (Sabrina M)  I discussed the limitations of evaluation and management by telemedicine and the availability of in person appointments. The patient expressed understanding and agreed to proceed.  Subjective:   HPI:   Renal cysts- pt had CT scan w/ Dr Louis Meckel in October.  She has multiple large simple cysts and 1 hyperdense cyst of R lower pole that was poorly characterized by CT.  They recommended MRI in 6 months to reassess and better characterize.  Pt states she was unaware of this.  Wants to know if I think this is necessary.  Doesn't want to go to a 'private office' b/c 'they're more expensive'.  Wants to go through [facility] or [place].  I told her that we don't have a urologist and that [facility] is the only option in [place].    ROS:   See pertinent positives and negatives per HPI.  Patient Active Problem List   Diagnosis Date Noted  . Abnormal feces 11/28/2020  . First degree hemorrhoids 11/28/2020  . Generalized abdominal pain 11/28/2020  . Second degree hemorrhoids 11/28/2020  . Hemorrhage of rectum and anus 11/28/2020  . Wheezing 11/17/2020  . Primary osteoarthritis of both knees 10/20/2018  . GERD (gastroesophageal reflux disease) 10/12/2018  . Multiple falls 10/12/2018  . Multiple thyroid nodules 12/30/2017  . Diarrhea 09/20/2016  . Hyperthyroidism 04/21/2016  . Bowel incontinence 04/21/2016  . Leg pain, bilateral 03/02/2016  . Dysphagia 03/02/2016  . Olecranon bursitis of left elbow 01/30/2016  . Physical exam 01/27/2015  . Bilateral renal cysts 01/27/2015  . Left anterior knee pain 09/30/2014  . Insomnia 06/20/2014  .  Hemorrhoids, external 03/27/2014  . Anxiety and depression 07/16/2007  . Essential hypertension 07/16/2007  . Allergic rhinitis 07/16/2007  . Hyperlipidemia 06/26/2007  . TAH/BSO, HX OF 06/26/2007    Social History   Tobacco Use  . Smoking status: Never Smoker  . Smokeless tobacco: Never Used  Substance Use Topics  . Alcohol use: No    Current Outpatient Medications:  .  amLODipine (NORVASC) 10 MG tablet, Take 1 tablet by mouth once daily, Disp: 90 tablet, Rfl: 1 .  aspirin 325 MG tablet, Take 162.5 mg by mouth daily., Disp: , Rfl:  .  clonazePAM (KLONOPIN) 0.5 MG tablet, Take 1 tablet (0.5 mg total) by mouth 2 (two) times daily as needed., Disp: 60 tablet, Rfl: 1 .  fluticasone (FLONASE) 50 MCG/ACT nasal spray, Use 2 spray(s) in each nostril once daily, Disp: 16 g, Rfl: 0 .  meclizine (ANTIVERT) 25 MG tablet, Take 1 tablet (25 mg total) by mouth 3 (three) times daily as needed for dizziness., Disp: 30 tablet, Rfl: 0 .  doxycycline (VIBRA-TABS) 100 MG tablet, Take 1 tablet (100 mg total) by mouth 2 (two) times daily. (Patient not taking: Reported on 12/23/2020), Disp: 14 tablet, Rfl: 0 .  fluticasone furoate-vilanterol (BREO ELLIPTA) 100-25 MCG/INH AEPB, Inhale 1 puff into the lungs daily. (Patient not taking: Reported on 12/23/2020), Disp: 1 each, Rfl: 0 .  traZODone (DESYREL) 50 MG tablet, Take 0.5-1 tablets (25-50 mg total) by mouth at bedtime as needed for sleep. (Patient not taking: Reported on 12/23/2020), Disp: 30 tablet, Rfl: 3  Allergies  Allergen Reactions  . Influenza Vaccines     Rash, tongue swelling    Objective:   LMP 06/30/2013  Pt is able to speak clearly, coherently without shortness of breath or increased work of breathing.  Thought process is linear.  Mood is appropriate.   Assessment and Plan:   Bilateral Renal Cysts- our conversation went round and round about whether or not she needed the MRI.  Once it was agreed upon that she needed the MRI, she wasn't  sure she wanted to get it w/ [facility] b/c they're 'expensive'.  I explained that they are affiliated w/ the [facility] but are a private office.  They are also the only urology group in [place].  I reviewed Dr Herrick's note from October with her.  She did not remember the plan.  States she needs an MRI.  Encouraged her to call the imaging center that she has the appt with and let them know.   Annye Asa, MD 12/23/2020  Time spent with the patient: 13 minutes, of which >50% was spent in obtaining information about symptoms, reviewing previous labs, evaluations, and treatments, counseling about condition (please see the discussed topics above), and developing a plan to further investigate it; had a number of questions which I addressed.

## 2020-12-24 ENCOUNTER — Other Ambulatory Visit: Payer: Self-pay | Admitting: Urology

## 2020-12-24 ENCOUNTER — Ambulatory Visit: Payer: Medicare HMO | Admitting: Family Medicine

## 2020-12-24 DIAGNOSIS — N281 Cyst of kidney, acquired: Secondary | ICD-10-CM

## 2021-01-05 ENCOUNTER — Other Ambulatory Visit: Payer: Self-pay | Admitting: Family Medicine

## 2021-01-12 ENCOUNTER — Ambulatory Visit (HOSPITAL_COMMUNITY): Payer: Medicare HMO

## 2021-01-12 ENCOUNTER — Encounter (HOSPITAL_COMMUNITY): Payer: Self-pay

## 2021-01-16 ENCOUNTER — Other Ambulatory Visit: Payer: Self-pay

## 2021-01-16 ENCOUNTER — Ambulatory Visit
Admission: RE | Admit: 2021-01-16 | Discharge: 2021-01-16 | Disposition: A | Discharge status: Home / Self Care | Payer: Medicare HMO | Source: Ambulatory Visit | Attending: Urology | Admitting: Urology

## 2021-01-16 DIAGNOSIS — K7689 Other specified diseases of liver: Secondary | ICD-10-CM | POA: Diagnosis not present

## 2021-01-16 DIAGNOSIS — N281 Cyst of kidney, acquired: Secondary | ICD-10-CM

## 2021-01-16 DIAGNOSIS — M5136 Other intervertebral disc degeneration, lumbar region: Secondary | ICD-10-CM | POA: Diagnosis not present

## 2021-01-16 DIAGNOSIS — K820 Obstruction of gallbladder: Secondary | ICD-10-CM | POA: Diagnosis not present

## 2021-01-16 MED ORDER — GADOBENATE DIMEGLUMINE 529 MG/ML IV SOLN
14.0000 mL | Freq: Once | INTRAVENOUS | Status: AC | PRN
  Administered 2021-01-16: 14 mL via INTRAVENOUS

## 2021-01-20 ENCOUNTER — Telehealth: Payer: Self-pay

## 2021-01-20 NOTE — Telephone Encounter (Signed)
Was able to confirm w/ [facility] on [address]. They have over #20 in stock of non-teva brand that they can give patient today or tomorrow. Patient informed and understands.

## 2021-01-20 NOTE — Telephone Encounter (Signed)
Pt called asking to speak with Edison Nasuti. Please advise.

## 2021-01-20 NOTE — Telephone Encounter (Signed)
Spoke with patient regarding clonazepam Rx. Preferred mfg for her is actavis and not teva.   Confirmed this is currently on [facility] order however actavis brand may be on back order per pharmacy. They stated they would have another update / see if actavis brand is in today when they process order at 3pm and they can update pt then   [facility] is not able to do central search to see what other stores have in stock.

## 2021-02-18 ENCOUNTER — Telehealth: Payer: Self-pay

## 2021-02-18 NOTE — Chronic Care Management (AMB) (Signed)
Chronic Care Management Pharmacy Assistant   Name: Margaret Torres  MRN: [id number] DOB: 08-Jun-1946  Reason for Encounter: Hypertension Disease State Call  Recent office visits:  12/23/20- Annye Asa, MD (Video Visit)- Questions about MRI  Recent consult visits:   12/16/20- Laurin Coder, MD (Pulmonary)- seen for wheezing, follow up 1 yr  Hospital visits:  None in previous 6 months  Medications: Outpatient Encounter Medications as of 02/18/2021  Medication Sig  . amLODipine (NORVASC) 10 MG tablet Take 1 tablet by mouth once daily  . aspirin 325 MG tablet Take 162.5 mg by mouth daily.  . clonazePAM (KLONOPIN) 0.5 MG tablet Take 1 tablet (0.5 mg total) by mouth 2 (two) times daily as needed.  . doxycycline (VIBRA-TABS) 100 MG tablet Take 1 tablet (100 mg total) by mouth 2 (two) times daily. (Patient not taking: Reported on 12/23/2020)  . fluticasone (FLONASE) 50 MCG/ACT nasal spray Use 2 spray(s) in each nostril once daily  . fluticasone furoate-vilanterol (BREO ELLIPTA) 100-25 MCG/INH AEPB Inhale 1 puff into the lungs daily. (Patient not taking: Reported on 12/23/2020)  . meclizine (ANTIVERT) 25 MG tablet Take 1 tablet (25 mg total) by mouth 3 (three) times daily as needed for dizziness.  . traZODone (DESYREL) 50 MG tablet Take 0.5-1 tablets (25-50 mg total) by mouth at bedtime as needed for sleep. (Patient not taking: Reported on 12/23/2020)   No facility-administered encounter medications on file as of 02/18/2021.   Reviewed chart prior to disease state call. Spoke with patient regarding BP  Recent Office Vitals: BP Readings from Last 3 Encounters:  12/16/20 122/80  11/28/20 (!) 142/80  11/17/20 118/70   Pulse Readings from Last 3 Encounters:  12/16/20 (!) 58  11/28/20 62  11/17/20 (!) 50    Wt Readings from Last 3 Encounters:  12/16/20 157 lb 6.4 oz (71.4 kg)  11/28/20 155 lb 6.4 oz (70.5 kg)  11/17/20 155 lb (70.3 kg)     Kidney Function Lab Results   Component Value Date/Time   CREATININE 0.96 11/28/2020 11:22 AM   CREATININE 1.18 05/28/2020 10:47 AM   GFR 57.66 (L) 11/28/2020 11:22 AM   GFRNONAA >60 03/01/2015 06:33 AM   GFRAA >60 03/01/2015 06:33 AM    BMP Latest Ref Rng & Units 11/28/2020 05/28/2020 11/26/2019  Glucose 70 - 99 mg/dL 91 85 90  BUN 6 - 23 mg/dL 19 15 12   Creatinine 0.40 - 1.20 mg/dL 0.96 1.18 0.94  Sodium 135 - 145 mEq/L 137 138 138  Potassium 3.5 - 5.1 mEq/L 3.7 4.4 4.5  Chloride 96 - 112 mEq/L 103 103 104  CO2 19 - 32 mEq/L 27 27 28   Calcium 8.4 - 10.5 mg/dL 9.5 9.6 9.4   I spoke with Margaret Torres this morning and she is doing well overall. She has been taking her medications as directed and is content with receiving her medications through [facility]. She does find [facility] to be convenient, but at times a little difficult to keep track of. She was unaware that her amlodipine was to refilled soon so she will be calling [facility] to do that. She has not been taking her trazadone as she says it keeps her awake instead of aiding her to sleep. She would like both CPP and PCP advice about getting an alternative medication. Margaret Torres recently had an MRI which had negative results, but she is quite upset about having a high co-pay and does not wish to continue any appointments with that private office.  Current antihypertensive regimen:  Amlodipine 10 mg- take one tab daily   How often are you checking your Blood Pressure? Patient stated she checks her BP daily BB  Current home BP readings: 130/74  What recent interventions/DTPs have been made by any provider to improve Blood Pressure control since last CPP Visit:  No recent interventions  Any recent hospitalizations or ED visits since last visit with CPP? No  What diet changes have been made to improve Blood Pressure Control?  Patient stated she eats very healthy with a diet consisting of vegetables and fruits. She is also very herbal with consistent use of tumeric and  ginger. She had oatmeal and an apple for breakfast this morning and usually eats this.  What exercise is being done to improve your Blood Pressure Control?  Patient stated she does yard work every morning as a part of her activity   Adherence Review: Is the patient currently on ACE/ARB medication? No Does the patient have >5 day gap between last estimated fill dates? No Amlodipine 10 mg - [facility] last filled 11/26/20   Star Rating Drugs: No Star Rating Drugs  [employer], CMA

## 2021-02-19 NOTE — Progress Notes (Signed)
Subjective:   Margaret Torres is a 75 y.o. female who presents for Medicare Annual (Subsequent) preventive examination.  Review of Systems     Cardiac Risk Factors include: advanced age (>26men, >86 women);dyslipidemia;hypertension     Objective:    Today's Vitals   02/23/21 0854  BP: 128/70  Pulse: 68  Resp: 16  Temp: 98.2 F (36.8 C)  TempSrc: Temporal  SpO2: 98%  Weight: 155 lb 12.8 oz (70.7 kg)  Height: 5\' 4"  (1.626 m)   Body mass index is 26.74 kg/m.  Advanced Directives 02/23/2021 02/20/2020 05/13/2016 03/01/2015  Does Patient Have a Medical Advance Directive? No Yes No No  Type of Advance Directive - Healthcare Power of [place];Living will - -  Does patient want to make changes to medical advance directive? - No - Patient declined - -  Copy of [place] in Chart? - No - copy requested - -  Would patient like information on creating a medical advance directive? Yes (MAU/Ambulatory/Procedural Areas - Information given) - No - patient declined information No - patient declined information    Current Medications (verified) Outpatient Encounter Medications as of 02/23/2021  Medication Sig  . amLODipine (NORVASC) 10 MG tablet Take 1 tablet by mouth once daily  . aspirin 325 MG tablet Take 162.5 mg by mouth daily.  . clonazePAM (KLONOPIN) 0.5 MG tablet Take 1 tablet (0.5 mg total) by mouth 2 (two) times daily as needed.  Marland Kitchen dextromethorphan-guaiFENesin (MUCINEX DM) 30-600 MG 12hr tablet Take 1 tablet by mouth 2 (two) times daily as needed for cough.  . fluticasone (FLONASE) 50 MCG/ACT nasal spray Use 2 spray(s) in each nostril once daily  . loratadine (CLARITIN) 10 MG tablet Take 10 mg by mouth daily as needed for allergies.  Marland Kitchen meclizine (ANTIVERT) 25 MG tablet Take 1 tablet (25 mg total) by mouth 3 (three) times daily as needed for dizziness.  . fluticasone furoate-vilanterol (BREO ELLIPTA) 100-25 MCG/INH AEPB Inhale 1 puff into the lungs daily. (Patient  not taking: No sig reported)  . traZODone (DESYREL) 50 MG tablet Take 0.5-1 tablets (25-50 mg total) by mouth at bedtime as needed for sleep. (Patient not taking: No sig reported)  . [DISCONTINUED] doxycycline (VIBRA-TABS) 100 MG tablet Take 1 tablet (100 mg total) by mouth 2 (two) times daily. (Patient not taking: Reported on 12/23/2020)   No facility-administered encounter medications on file as of 02/23/2021.    Allergies (verified) Influenza vaccines   History: Past Medical History:  Diagnosis Date  . Anxiety   . Cervical cancer ([place])   . Depression   . Hypertension    Past Surgical History:  Procedure Laterality Date  . ABDOMINAL HYSTERECTOMY    . WISDOM TOOTH EXTRACTION Bilateral    Family History  Problem Relation Age of Onset  . Cervical cancer Mother   . Hypertension Mother   . Hypertension Father   . Heart disease Brother   . Diabetes Brother   . Stroke Maternal Grandmother   . Cancer Other   . Diabetes Sister   . Stroke Sister   . Cancer Sister        bone cancer  . Breast cancer Sister   . Heart attack Son   . Thyroid disease Neg Hx    Social History   Socioeconomic History  . Marital status: Divorced    Spouse name: Not on file  . Number of children: 3  . Years of education: Not on file  . Highest education level:  Not on file  Occupational History  . Occupation: Retired   Tobacco Use  . Smoking status: Never Smoker  . Smokeless tobacco: Never Used  Substance and Sexual Activity  . Alcohol use: No  . Drug use: No  . Sexual activity: Not Currently    Birth control/protection: Post-menopausal  Other Topics Concern  . Not on file  Social History Narrative   Hobbies: enjoys fishing    1 Daughter that lives locally    1 Son lives in [place]    3 [place]    1 Son deceased    Social Determinants of Health   Financial Resource Strain: [place]   . Difficulty of Paying Living Expenses: Not hard at all  Food Insecurity: No Food Insecurity  .  Worried About Charity fundraiser in the Last Year: Never true  . Ran Out of Food in the Last Year: Never true  Transportation Needs: No Transportation Needs  . Lack of Transportation (Medical): No  . Lack of Transportation (Non-Medical): No  Physical Activity: Sufficiently Active  . Days of Exercise per Week: 7 days  . Minutes of Exercise per Session: 30 min  Stress: No Stress Concern Present  . Feeling of Stress : Only a little  Social Connections: Moderately Isolated  . Frequency of Communication with Friends and Family: More than three times a week  . Frequency of Social Gatherings with Friends and Family: More than three times a week  . Attends Religious Services: More than 4 times per year  . Active Member of Clubs or Organizations: No  . Attends Archivist Meetings: Never  . Marital Status: Widowed    Tobacco Counseling Counseling given: Not Answered   Clinical Intake:  Pre-visit preparation completed: Yes  Pain : No/denies pain     Nutritional Status: BMI 25 -29 Overweight Nutritional Risks: None Diabetes: No  How often do you need to have someone help you when you read instructions, pamphlets, or other written materials from your doctor or pharmacy?: 1 - Never  Diabetic?No  Interpreter Needed?: No  Information entered by :: Caroleen Hamman LPn   Activities of Daily Living In your present state of health, do you have any difficulty performing the following activities: 02/23/2021 12/23/2020  Hearing? N N  Vision? N N  Difficulty concentrating or making decisions? Y N  Comment occasionally -  Walking or climbing stairs? N N  Dressing or bathing? N N  Doing errands, shopping? N N  Preparing Food and eating ? N -  Using the Toilet? N -  In the past six months, have you accidently leaked urine? Y -  Comment at night -  Do you have problems with loss of bowel control? N -  Managing your Medications? N -  Managing your Finances? N -  Housekeeping or  managing your Housekeeping? N -  Some recent data might be hidden    Patient Care Team: Midge Minium, MD as PCP - General (Family Medicine) Elayne Snare, MD as Consulting Physician (Endocrinology) Carol Ada, MD as Consulting Physician (Gastroenterology) Madelin Rear, [facility] as Pharmacist (Pharmacist)  Indicate any recent Medical Services you may have received from other than [facility] providers in the past year (date may be approximate).     Assessment:   This is a routine wellness examination for [place].  Hearing/Vision screen  Hearing Screening   125Hz  250Hz  500Hz  1000Hz  2000Hz  3000Hz  4000Hz  6000Hz  8000Hz   Right ear:  Left ear:           Comments: No issues  Vision Screening Comments: Reading glasses Last eye exam-6 months ago-[facility]  Dietary issues and exercise activities discussed: Current Exercise Habits: Home exercise routine (yardwork), Type of exercise: Other - see comments, Time (Minutes): 60, Frequency (Times/Week): 7, Weekly Exercise (Minutes/Week): 420, Intensity: Mild, Exercise limited by: None identified  Goals Addressed            This Visit's Progress   . Patient Stated       Maintain current healthy active lifestyle      Depression Screen PHQ 2/9 Scores 02/23/2021 12/23/2020 11/28/2020 08/05/2020 05/28/2020 05/08/2020 04/22/2020  PHQ - 2 Score 1 0 0 0 2 0 1  PHQ- 9 Score - 0 0 0 2 - 1    Fall Risk Fall Risk  02/23/2021 12/23/2020 11/28/2020 08/05/2020 05/28/2020  Falls in the past year? 0 0 0 0 0  Number falls in past yr: 0 0 0 0 0  Comment - - - - -  Injury with Fall? 0 0 0 0 0  Risk for fall due to : - No Fall Risks No Fall Risks No Fall Risks -  Follow up Falls prevention discussed - - - Falls evaluation completed    FALL RISK PREVENTION PERTAINING TO THE HOME:  Any stairs in or around the home? Yes  If so, are there any without handrails? No  Home free of loose throw rugs in walkways, pet beds, electrical cords, etc?  Yes  Adequate lighting in your home to reduce risk of falls? Yes   ASSISTIVE DEVICES UTILIZED TO PREVENT FALLS:  Life alert? No  Use of a cane, walker or w/c? No  Grab bars in the bathroom? No  Shower chair or bench in shower? No  Elevated toilet seat or a handicapped toilet? No   TIMED UP AND GO:  Was the test performed? Yes .  Length of time to ambulate 10 feet: 10 sec.   Gait slow and steady without use of assistive device  Cognitive Function:Normal cognitive status assessed by direct observation by this Nurse Health Advisor. No abnormalities found.       6CIT Screen 02/23/2021 02/20/2020  What Year? 0 points 0 points  What month? 0 points 0 points  What time? 0 points 0 points  Count back from 20 0 points 0 points  Months in reverse 0 points 0 points  Repeat phrase 0 points 0 points  Total Score 0 0    Immunizations Immunization History  Administered Date(s) Administered  . Moderna Sars-Covid-2 Vaccination 02/28/2020, 03/30/2020  . Pneumococcal Polysaccharide-23 03/27/2013    TDAP status: Due, Education has been provided regarding the importance of this vaccine. Advised may receive this vaccine at local pharmacy or Health Dept. Aware to provide a copy of the vaccination record if obtained from local pharmacy or Health Dept. Verbalized acceptance and understanding.  Flu Vaccine status: Due, Education has been provided regarding the importance of this vaccine. Advised may receive this vaccine at local pharmacy or Health Dept. Aware to provide a copy of the vaccination record if obtained from local pharmacy or Health Dept. Verbalized acceptance and understanding.  Pneumococcal vaccine status: Due, Education has been provided regarding the importance of this vaccine. Advised may receive this vaccine at local pharmacy or Health Dept. Aware to provide a copy of the vaccination record if obtained from local pharmacy or Health Dept. Verbalized acceptance and  understanding.  Covid-19 vaccine  status: Information provided on how to obtain vaccines. Booster  Qualifies for Shingles Vaccine? Yes   Zostavax completed No   Shingrix Completed?: No.    Education has been provided regarding the importance of this vaccine. Patient has been advised to call insurance company to determine out of pocket expense if they have not yet received this vaccine. Advised may also receive vaccine at local pharmacy or Health Dept. Verbalized acceptance and understanding.  Screening Tests Health Maintenance  Topic Date Due  . COVID-19 Vaccine (3 - Booster for Moderna series) 06/11/2021 (Originally 08/30/2020)  . PNA vac Low Risk Adult (2 of 2 - PCV13) 08/05/2021 (Originally 03/27/2014)  . TETANUS/TDAP  11/28/2021 (Originally [birth year]12/1964)  . Hepatitis C Screening  12/23/2021 (Originally 11[birth year]11/1962)  . DEXA SCAN  02/19/2022 (Originally 11/11/2009)  . HPV VACCINES  Aged Out    Health Maintenance  There are no preventive care reminders to display for this patient.  Colorectal cancer screening: No longer required.   Mammogram status: Completed Bilateral-05/22/2020. Repeat every year  Bone Density status: Due-Declined   Lung Cancer Screening: (Low Dose CT Chest recommended if Age 72-80 years, 30 pack-year currently smoking OR have quit w/in 15years.) does not qualify.     Additional Screening:  Hepatitis C Screening: does qualify; Declined  Vision Screening: Recommended annual ophthalmology exams for early detection of glaucoma and other disorders of the eye. Is the patient up to date with their annual eye exam?  Yes  Who is the provider or what is the name of the office in which the patient attends annual eye exams? [facility]   Dental Screening: Recommended annual dental exams for proper oral hygiene  Community Resource Referral / Chronic Care Management: CRR required this visit?  No   CCM required this visit?  No      Plan:     I have personally  reviewed and noted the following in the patient's chart:   . Medical and social history . Use of alcohol, tobacco or illicit drugs  . Current medications and supplements including opioid prescriptions.  . Functional ability and status . Nutritional status . Physical activity . Advanced directives . List of other physicians . Hospitalizations, surgeries, and ER visits in previous 12 months . Vitals . Screenings to include cognitive, depression, and falls . Referrals and appointments  In addition, I have reviewed and discussed with patient certain preventive protocols, quality metrics, and best practice recommendations. A written personalized care plan for preventive services as well as general preventive health recommendations were provided to patient.     Marta Antu, LPN   11/06/5168  Nurse Health Advisor  Nurse Notes: None

## 2021-02-23 ENCOUNTER — Other Ambulatory Visit: Payer: Self-pay

## 2021-02-23 ENCOUNTER — Ambulatory Visit: Payer: Medicare HMO

## 2021-02-23 ENCOUNTER — Ambulatory Visit (INDEPENDENT_AMBULATORY_CARE_PROVIDER_SITE_OTHER): Payer: Medicare HMO

## 2021-02-23 VITALS — BP 128/70 | HR 68 | Temp 98.2°F | Resp 16 | Ht 64.0 in | Wt 155.8 lb

## 2021-02-23 DIAGNOSIS — Z0000 Encounter for general adult medical examination without abnormal findings: Secondary | ICD-10-CM

## 2021-02-23 NOTE — Patient Instructions (Signed)
Margaret Torres , Thank you for taking time to come for your Medicare Wellness Visit. I appreciate your ongoing commitment to your health goals. Please review the following plan we discussed and let me know if I can assist you in the future.   Screening recommendations/referrals: Colonoscopy: No longer required Mammogram: Completed 05/22/2020-Due 05/22/2021 Bone Density: Due-Declined. Call the office to schedule if you change your mind. Recommended yearly ophthalmology/optometry visit for glaucoma screening and checkup Recommended yearly dental visit for hygiene and checkup  Vaccinations: Influenza vaccine: Allergic Pneumococcal vaccine: Allergic Tdap vaccine: Discuss with pharmacy Shingles vaccine: Discuss with pharmacy Covid-19:Booster due-Declined  Advanced directives: Information given today  Conditions/risks identified: See problem list  Next appointment: Follow up in one year for your annual wellness visit    Preventive Care 65 Years and Older, Female Preventive care refers to lifestyle choices and visits with your health care provider that can promote health and wellness. What does preventive care include?  A yearly physical exam. This is also called an annual well check.  Dental exams once or twice a year.  Routine eye exams. Ask your health care provider how often you should have your eyes checked.  Personal lifestyle choices, including:  Daily care of your teeth and gums.  Regular physical activity.  Eating a healthy diet.  Avoiding tobacco and drug use.  Limiting alcohol use.  Practicing safe sex.  Taking low-dose aspirin every day.  Taking vitamin and mineral supplements as recommended by your health care provider. What happens during an annual well check? The services and screenings done by your health care provider during your annual well check will depend on your age, overall health, lifestyle risk factors, and family history of disease. Counseling  Your  health care provider may ask you questions about your:  Alcohol use.  Tobacco use.  Drug use.  Emotional well-being.  Home and relationship well-being.  Sexual activity.  Eating habits.  History of falls.  Memory and ability to understand (cognition).  Work and work Statistician.  Reproductive health. Screening  You may have the following tests or measurements:  Height, weight, and BMI.  Blood pressure.  Lipid and cholesterol levels. These may be checked every 5 years, or more frequently if you are over 87 years old.  Skin check.  Lung cancer screening. You may have this screening every year starting at age 31 if you have a 30-pack-year history of smoking and currently smoke or have quit within the past 15 years.  Fecal occult blood test (FOBT) of the stool. You may have this test every year starting at age 46.  Flexible sigmoidoscopy or colonoscopy. You may have a sigmoidoscopy every 5 years or a colonoscopy every 10 years starting at age 80.  Hepatitis C blood test.  Hepatitis B blood test.  Sexually transmitted disease (STD) testing.  Diabetes screening. This is done by checking your blood sugar (glucose) after you have not eaten for a while (fasting). You may have this done every 1-3 years.  Bone density scan. This is done to screen for osteoporosis. You may have this done starting at age 51.  Mammogram. This may be done every 1-2 years. Talk to your health care provider about how often you should have regular mammograms. Talk with your health care provider about your test results, treatment options, and if necessary, the need for more tests. Vaccines  Your health care provider may recommend certain vaccines, such as:  Influenza vaccine. This is recommended every year.  Tetanus, diphtheria,  and acellular pertussis (Tdap, Td) vaccine. You may need a Td booster every 10 years.  Zoster vaccine. You may need this after age 61.  Pneumococcal 13-valent  conjugate (PCV13) vaccine. One dose is recommended after age 45.  Pneumococcal polysaccharide (PPSV23) vaccine. One dose is recommended after age 95. Talk to your health care provider about which screenings and vaccines you need and how often you need them. This information is not intended to replace advice given to you by your health care provider. Make sure you discuss any questions you have with your health care provider. Document Released: 10/24/2015 Document Revised: 06/16/2016 Document Reviewed: 07/29/2015 Elsevier Interactive Patient Education  2017 [employer] Prevention in the Home Falls can cause injuries. They can happen to people of all ages. There are many things you can do to make your home safe and to help prevent falls. What can I do on the outside of my home?  Regularly fix the edges of walkways and driveways and fix any cracks.  Remove anything that might make you trip as you walk through a door, such as a raised step or threshold.  Trim any bushes or trees on the path to your home.  Use bright outdoor lighting.  Clear any walking paths of anything that might make someone trip, such as rocks or tools.  Regularly check to see if handrails are loose or broken. Make sure that both sides of any steps have handrails.  Any raised decks and porches should have guardrails on the edges.  Have any leaves, snow, or ice cleared regularly.  Use sand or salt on walking paths during winter.  Clean up any spills in your garage right away. This includes oil or grease spills. What can I do in the bathroom?  Use night lights.  Install grab bars by the toilet and in the tub and shower. Do not use towel bars as grab bars.  Use non-skid mats or decals in the tub or shower.  If you need to sit down in the shower, use a plastic, non-slip stool.  Keep the floor dry. Clean up any water that spills on the floor as soon as it happens.  Remove soap buildup in the tub or  shower regularly.  Attach bath mats securely with double-sided non-slip rug tape.  Do not have throw rugs and other things on the floor that can make you trip. What can I do in the bedroom?  Use night lights.  Make sure that you have a light by your bed that is easy to reach.  Do not use any sheets or blankets that are too big for your bed. They should not hang down onto the floor.  Have a firm chair that has side arms. You can use this for support while you get dressed.  Do not have throw rugs and other things on the floor that can make you trip. What can I do in the kitchen?  Clean up any spills right away.  Avoid walking on wet floors.  Keep items that you use a lot in easy-to-reach places.  If you need to reach something above you, use a strong step stool that has a grab bar.  Keep electrical cords out of the way.  Do not use floor polish or wax that makes floors slippery. If you must use wax, use non-skid floor wax.  Do not have throw rugs and other things on the floor that can make you trip. What can I do with my  stairs?  Do not leave any items on the stairs.  Make sure that there are handrails on both sides of the stairs and use them. Fix handrails that are broken or loose. Make sure that handrails are as long as the stairways.  Check any carpeting to make sure that it is firmly attached to the stairs. Fix any carpet that is loose or worn.  Avoid having throw rugs at the top or bottom of the stairs. If you do have throw rugs, attach them to the floor with carpet tape.  Make sure that you have a light switch at the top of the stairs and the bottom of the stairs. If you do not have them, ask someone to add them for you. What else can I do to help prevent falls?  Wear shoes that:  Do not have high heels.  Have rubber bottoms.  Are comfortable and fit you well.  Are closed at the toe. Do not wear sandals.  If you use a stepladder:  Make sure that it is fully  opened. Do not climb a closed stepladder.  Make sure that both sides of the stepladder are locked into place.  Ask someone to hold it for you, if possible.  Clearly mark and make sure that you can see:  Any grab bars or handrails.  First and last steps.  Where the edge of each step is.  Use tools that help you move around (mobility aids) if they are needed. These include:  Canes.  Walkers.  Scooters.  Crutches.  Turn on the lights when you go into a dark area. Replace any light bulbs as soon as they burn out.  Set up your furniture so you have a clear path. Avoid moving your furniture around.  If any of your floors are uneven, fix them.  If there are any pets around you, be aware of where they are.  Review your medicines with your doctor. Some medicines can make you feel dizzy. This can increase your chance of falling. Ask your doctor what other things that you can do to help prevent falls. This information is not intended to replace advice given to you by your health care provider. Make sure you discuss any questions you have with your health care provider. Document Released: 07/24/2009 Document Revised: 03/04/2016 Document Reviewed: 11/01/2014 Elsevier Interactive Patient Education  2017 [employer].

## 2021-03-13 ENCOUNTER — Ambulatory Visit (INDEPENDENT_AMBULATORY_CARE_PROVIDER_SITE_OTHER): Payer: Medicare HMO | Admitting: Family Medicine

## 2021-03-13 ENCOUNTER — Other Ambulatory Visit: Payer: Self-pay

## 2021-03-13 ENCOUNTER — Encounter: Payer: Self-pay | Admitting: Family Medicine

## 2021-03-13 VITALS — BP 130/80 | HR 66 | Temp 97.7°F | Resp 19 | Ht 64.0 in | Wt 154.0 lb

## 2021-03-13 DIAGNOSIS — Z719 Counseling, unspecified: Secondary | ICD-10-CM

## 2021-03-13 DIAGNOSIS — G4700 Insomnia, unspecified: Secondary | ICD-10-CM

## 2021-03-13 MED ORDER — BELSOMRA 10 MG PO TABS: 10.0000 mg | ORAL_TABLET | Freq: Every evening | ORAL | 3 refills | Status: DC | PRN

## 2021-03-13 MED ORDER — CLONAZEPAM 0.5 MG PO TABS: 0.5000 mg | ORAL_TABLET | Freq: Two times a day (BID) | ORAL | 1 refills | Status: DC | PRN

## 2021-03-13 NOTE — Patient Instructions (Signed)
Schedule your complete physical for after 8/18 No need for labs today- yay! Start the Belsomra nightly to improve sleep Thankfully you don't have to worry about Lyme Disease bc the tick wasn't full of blood Call with any questions or concerns Have a great weekend!!

## 2021-03-13 NOTE — Progress Notes (Signed)
   Subjective:    [name], female    DOB: January 10, 1946, 75 y.o.   MRN: [id number]  HPI Fatigue- 'I really just don't sleep at night'.  No improvement w/ trazodone.  Reports 'mind will be running'.  'I wake up tired'.  Possible tick bite- pt was working in her yard 'in springtime'.  Grandson had Lyme disease and now pt is worried.  Tick was not engorged.  Area has a small scab.  Review of Systems For ROS see HPI   This visit occurred during the SARS-CoV-2 public health emergency.  Safety protocols were in place, including screening questions prior to the visit, additional usage of staff PPE, and extensive cleaning of exam room while observing appropriate contact time as indicated for disinfecting solutions.       Objective:   Physical Exam Vitals reviewed.  Constitutional:      General: She is not in acute distress.    Appearance: Normal appearance. She is not ill-appearing.  HENT:     Head: Normocephalic and atraumatic.  Eyes:     Extraocular Movements: Extraocular movements intact.     Conjunctiva/sclera: Conjunctivae normal.     Pupils: Pupils are equal, round, and reactive to light.  Cardiovascular:     Rate and Rhythm: Normal rate and regular rhythm.  Pulmonary:     Effort: Pulmonary effort is normal. No respiratory distress.  Musculoskeletal:     Right lower leg: No edema.     Left lower leg: No edema.  Skin:    General: Skin is warm and dry.     Findings: No erythema, lesion or rash.  Neurological:     General: No focal deficit present.     Mental Status: She is alert and oriented to person, place, and time.  Psychiatric:     Comments: Anxious but otherwise normal           Assessment & Plan:  Counseling- new.  Reviewed that in order for pt to contract Lyme Disease the tick would have had to be embedded and engorged.  This was not the case.  Reassurance provided.  No need for labs at this time

## 2021-03-13 NOTE — Assessment & Plan Note (Signed)
Ongoing issue for pt.  No relief w/ Trazodone.  Again discussed that her insomnia is due to untreated anxiety but pt isn't willing to take daily anxiety medication w/ exception of Clonazepam.  Do not want to add a separate Benzo for sleep and worry about safety of Ambien, will start Belsomra.  Pt expressed understanding and is in agreement w/ plan.

## 2021-04-08 ENCOUNTER — Encounter: Payer: Self-pay | Admitting: *Deleted

## 2021-04-15 ENCOUNTER — Other Ambulatory Visit: Payer: Self-pay

## 2021-04-15 ENCOUNTER — Telehealth: Payer: Self-pay

## 2021-04-15 DIAGNOSIS — J309 Allergic rhinitis, unspecified: Secondary | ICD-10-CM

## 2021-04-15 MED ORDER — LORATADINE 10 MG PO TABS: 10.0000 mg | ORAL_TABLET | Freq: Every day | ORAL | 2 refills | Status: DC | PRN

## 2021-04-15 MED ORDER — FLUTICASONE PROPIONATE 50 MCG/ACT NA SUSP: NASAL | 0 refills | Status: DC

## 2021-04-15 NOTE — Chronic Care Management (AMB) (Signed)
Chronic Care Management Pharmacy Assistant   Name: Margaret Torres  MRN: [id number] DOB: 07-21-1946  Reason for Encounter: Hypertension Disease State Call  Recent office visits:  03/13/21- Annye Asa, MD- seen for insomnia, started suvorexant 10 mg qhs prn, schedule physical for follow up after 08/18  Recent consult visits:  No visits noted   Hospital visits:  None in previous 6 months  Medications: Outpatient Encounter Medications as of 04/15/2021  Medication Sig   amLODipine (NORVASC) 10 MG tablet Take 1 tablet by mouth once daily   aspirin 325 MG tablet Take 162.5 mg by mouth daily.   clonazePAM (KLONOPIN) 0.5 MG tablet Take 1 tablet (0.5 mg total) by mouth 2 (two) times daily as needed.   dextromethorphan-guaiFENesin (MUCINEX DM) 30-600 MG 12hr tablet Take 1 tablet by mouth 2 (two) times daily as needed for cough.   fluticasone (FLONASE) 50 MCG/ACT nasal spray Use 2 spray(s) in each nostril once daily   loratadine (CLARITIN) 10 MG tablet Take 10 mg by mouth daily as needed for allergies.   meclizine (ANTIVERT) 25 MG tablet Take 1 tablet (25 mg total) by mouth 3 (three) times daily as needed for dizziness.   Suvorexant (BELSOMRA) 10 MG TABS Take 10 mg by mouth at bedtime as needed.   No facility-administered encounter medications on file as of 04/15/2021.    Reviewed chart prior to disease state call. Spoke with patient regarding BP  Recent Office Vitals: BP Readings from Last 3 Encounters:  03/13/21 130/80  02/23/21 128/70  12/16/20 122/80   Pulse Readings from Last 3 Encounters:  03/13/21 66  02/23/21 68  12/16/20 (!) 58    Wt Readings from Last 3 Encounters:  03/13/21 154 lb (69.9 kg)  02/23/21 155 lb 12.8 oz (70.7 kg)  12/16/20 157 lb 6.4 oz (71.4 kg)     Kidney Function Lab Results  Component Value Date/Time   CREATININE 0.96 11/28/2020 11:22 AM   CREATININE 1.18 05/28/2020 10:47 AM   GFR 57.66 (L) 11/28/2020 11:22 AM   GFRNONAA >60 03/01/2015  06:33 AM   GFRAA >60 03/01/2015 06:33 AM    BMP Latest Ref Rng & Units 11/28/2020 05/28/2020 11/26/2019  Glucose 70 - 99 mg/dL 91 85 90  BUN 6 - 23 mg/dL 19 15 12   Creatinine 0.40 - 1.20 mg/dL 0.96 1.18 0.94  Sodium 135 - 145 mEq/L 137 138 138  Potassium 3.5 - 5.1 mEq/L 3.7 4.4 4.5  Chloride 96 - 112 mEq/L 103 103 104  CO2 19 - 32 mEq/L 27 27 28   Calcium 8.4 - 10.5 mg/dL 9.5 9.6 9.4   I spoke with Margaret Torres this afternoon and she stated that she is not doing well. She was audibly exhausted during our conversation which she said was due to her just getting home. She was at the food pantry earlier and stated that the humidity drained much of her energy. She experiences many  allergy symptoms when she leaves the house such as ringing ears, sinus pressure, and watery eyes. This is the reason she usually stays home. Even though she is usually home, she stated that she is still unable to sleep. She was given a new prescription for sleep medication, but since she was unable to afford it, she has been without it for a month. She drinks peppermint tea to help relax her, but that doesn't always help especially when she is in a funk.  Another aspect that is contributing to her not feeling well is her  clonazepam. She stated that the brand of clonazepam that she was given for her last fill has been giving her palpitations and making her feel extra tired. She identified Teva as the manufacturer that causes these side effects. Because she is due for a refill, [facility] was contacted to verify the specific brands on hand and coordinate alternatives to Teva clonazepam. She does not take this every day due to the side effects and due to the fact that it keeps her awake.  Margaret Torres also stated that she doesn't take her amlodipine everyday because sometimes her BP is lowered naturally with her smoothies and she doesn't want to lower it any more.  Current antihypertensive regimen:  Amlodipine 10 mg- take one tab daily    How often are you checking your Blood Pressure?  Patient stated she checks he BP twice daily   Current home BP readings: 130/78, 120/69  What recent interventions/DTPs have been made by any provider to improve Blood Pressure control since last CPP Visit:  No recent interventions   Any recent hospitalizations or ED visits since last visit with CPP? No  What diet changes have been made to improve Blood Pressure Control?  Patient stated she has been making blueberry smoothies which helps lower her BP  What exercise is being done to improve your Blood Pressure Control?  Patient stated she doesn't get much activity due to her issues with humidity   Adherence Review: Is the patient currently on ACE/ARB medication? No Does the patient have >5 day gap between last estimated fill dates? No Amlodipine 10 mg- 90 DS last filled 02/19/21  Star Rating Drugs: No star rating drugs   Wilford Sports CPA, CMA

## 2021-04-16 ENCOUNTER — Telehealth: Payer: Self-pay

## 2021-04-16 NOTE — Telephone Encounter (Signed)
-----   Message from Midge Minium, MD sent at 04/15/2021  3:47 PM EDT ----- Regarding: FW: Refill request/ alternative Please convert this conversation to a phone note so we have documentation of these things.  Gabriel Cirri has already sent the Flonase and the Loratadine to the pharmacy.  As for the sleep medication, if the Suvorexant is too expensive, I don't feel comfortable sending an alternative at this time.  She is already on Clonazepam and other sleep medications can interact with this.  She didn't like the Trazodone which is the only other medication I feel comfortable prescribing at this time.  ----- Message ----- From: Wilford Sports Sent: 04/15/2021   1:44 PM EDT To: Midge Minium, MD, Kelton Pillar, CMA Subject: Refill request/ alternative                    Good afternoon,   Ms. Roderick has requested that a refill for [facility] to be sent to [facility] on [address]. She would also like a prescription for loratadine as Claritin is too expensive. Finally, the Suvorexant that she was prescribed is too expensive so she would like another alternative if possible. She will be picking up clonazepam tomorrow at [address] [facility] so can all of these be sent there?   Thank You

## 2021-04-16 NOTE — Telephone Encounter (Signed)
Patient requested a refill for Flonase nasal spray and Claritin. Rx was filled and sent to patient pharmacy.

## 2021-04-16 NOTE — Telephone Encounter (Signed)
-----   Message from Kelton Pillar, [place] sent at 04/15/2021  4:00 PM EDT ----- Regarding: FW: Refill request/ alternative  ----- Message ----- From: Midge Minium, MD Sent: 04/15/2021   3:50 PM EDT To: Kelton Pillar, CMA Subject: FW: Refill request/ alternative                Please convert this conversation to a phone note so we have documentation of these things.  Gabriel Cirri has already sent the Flonase and the Loratadine to the pharmacy.  As for the sleep medication, if the Suvorexant is too expensive, I don't feel comfortable sending an alternative at this time.  She is already on Clonazepam and other sleep medications can interact with this.  She didn't like the Trazodone which is the only other medication I feel comfortable prescribing at this time.  ----- Message ----- From: Wilford Sports Sent: 04/15/2021   1:44 PM EDT To: Midge Minium, MD, Kelton Pillar, CMA Subject: Refill request/ alternative                    Good afternoon,   Ms. Josefita has requested that a refill for [facility] to be sent to [facility] on [address]. She would also like a prescription for loratadine as Claritin is too expensive. Finally, the Suvorexant that she was prescribed is too expensive so she would like another alternative if possible. She will be picking up clonazepam tomorrow at [address] [facility] so can all of these be sent there?   Thank You

## 2021-04-16 NOTE — Chronic Care Management (AMB) (Signed)
I spoke with Margaret Torres this morning and she was given all information relayed by PCP and will be picking up her refills today.

## 2021-04-21 ENCOUNTER — Ambulatory Visit (INDEPENDENT_AMBULATORY_CARE_PROVIDER_SITE_OTHER): Payer: Medicare HMO | Admitting: Family Medicine

## 2021-04-21 ENCOUNTER — Other Ambulatory Visit: Payer: Self-pay

## 2021-04-21 ENCOUNTER — Encounter: Payer: Self-pay | Admitting: Family Medicine

## 2021-04-21 VITALS — BP 130/80 | HR 63 | Temp 97.5°F | Resp 17 | Ht 64.0 in | Wt 155.4 lb

## 2021-04-21 DIAGNOSIS — M7989 Other specified soft tissue disorders: Secondary | ICD-10-CM | POA: Diagnosis not present

## 2021-04-21 NOTE — Patient Instructions (Signed)
Follow up as needed or as scheduled We'll notify you of your ultrasound results as soon as they are available Call with any questions or concerns Hang in there!

## 2021-04-21 NOTE — Progress Notes (Signed)
   Subjective:    [name], female    DOB: [birth year], 75 y.o.   MRN: [id number]  HPI Lump on back- L lower back.  Initially was tender but area is no longer tender and she feels it is smaller.  First noted 3-4 weeks ago.  Now has noticed a similar area on R lower back.  Again, not TTP.   Review of Systems For ROS see HPI   This visit occurred during the SARS-CoV-2 public health emergency.  Safety protocols were in place, including screening questions prior to the visit, additional usage of staff PPE, and extensive cleaning of exam room while observing appropriate contact time as indicated for disinfecting solutions.      Objective:   Physical Exam Vitals reviewed.  Constitutional:      General: She is not in acute distress.    Appearance: Normal appearance. She is not ill-appearing.  HENT:     Head: Normocephalic and atraumatic.  Skin:    General: Skin is warm and dry.     Comments: 2 cm mobile subcutaneous soft tissue mass of L lower back, no TTP.  Area circled for pt 1 cm mobile subcutaneous soft tissue mass of R lower back, no TTP.  Area circled  Neurological:     General: No focal deficit present.     Mental Status: She is alert. Mental status is at baseline.          Assessment & Plan:  Soft Tissue Mass- new.  Likely lipoma or small cyst.  Will get [name] to assess as pt is concerned about this.  Thankfully they are asymptomatic and not causing any pain.  Pt expressed understanding and is in agreement w/ plan.

## 2021-04-22 ENCOUNTER — Ambulatory Visit
Admission: RE | Admit: 2021-04-22 | Discharge: 2021-04-22 | Disposition: A | Discharge status: Home / Self Care | Payer: Medicare HMO | Source: Ambulatory Visit | Attending: Family Medicine | Admitting: Family Medicine

## 2021-04-22 DIAGNOSIS — M7989 Other specified soft tissue disorders: Secondary | ICD-10-CM

## 2021-04-22 DIAGNOSIS — R222 Localized swelling, mass and lump, trunk: Secondary | ICD-10-CM | POA: Diagnosis not present

## 2021-04-23 NOTE — Patient Instructions (Signed)
Encounter in error

## 2021-05-07 ENCOUNTER — Telehealth: Payer: Self-pay

## 2021-05-07 NOTE — Chronic Care Management (AMB) (Signed)
    Chronic Care Management Pharmacy Assistant   Name: Margaret Torres  MRN: [id number] DOB: November 16, 1945  Reason for Encounter: Chart Review    Recent office visits:  04/21/2021 [facility] (PCP) Annye Asa, MD; acute visit for soft tissue mass, follow up as needed  Recent consult visits:  None  Hospital visits:  None in previous 6 months  Medications: Outpatient Encounter Medications as of 05/07/2021  Medication Sig   amLODipine (NORVASC) 10 MG tablet Take 1 tablet by mouth once daily   aspirin 325 MG tablet Take 162.5 mg by mouth daily.   clonazePAM (KLONOPIN) 0.5 MG tablet Take 1 tablet (0.5 mg total) by mouth 2 (two) times daily as needed.   dextromethorphan-guaiFENesin (MUCINEX DM) 30-600 MG 12hr tablet Take 1 tablet by mouth 2 (two) times daily as needed for cough.   fluticasone (FLONASE) 50 MCG/ACT nasal spray Use 2 spray(s) in each nostril once daily   loratadine (CLARITIN) 10 MG tablet Take 1 tablet (10 mg total) by mouth daily as needed for allergies. Take 10 mg by mouth daily as needed for allergies.   meclizine (ANTIVERT) 25 MG tablet Take 1 tablet (25 mg total) by mouth 3 (three) times daily as needed for dizziness.   No facility-administered encounter medications on file as of 05/07/2021.    Reviewed chart for medication changes.  No medication changes indicated.  Patient has follow up appointment scheduled with clinical pharmacist.  Future Appointments  Date Time Provider [place]  06/02/2021  8:30 AM Maximiano Coss, NP [facility]  08/27/2021  1:30 PM [facility] PHARMACIST [facility]    April D Calhoun, [place] Pharmacist Assistant [phone]

## 2021-06-02 ENCOUNTER — Ambulatory Visit (INDEPENDENT_AMBULATORY_CARE_PROVIDER_SITE_OTHER): Payer: Medicare HMO | Admitting: Registered Nurse

## 2021-06-02 ENCOUNTER — Encounter: Payer: Self-pay | Admitting: Registered Nurse

## 2021-06-02 ENCOUNTER — Encounter: Payer: Medicare HMO | Admitting: Family Medicine

## 2021-06-02 ENCOUNTER — Other Ambulatory Visit: Payer: Self-pay

## 2021-06-02 VITALS — BP 126/72 | HR 76 | Temp 98.1°F | Resp 18 | Ht 64.0 in | Wt 156.2 lb

## 2021-06-02 DIAGNOSIS — H8110 Benign paroxysmal vertigo, unspecified ear: Secondary | ICD-10-CM | POA: Diagnosis not present

## 2021-06-02 DIAGNOSIS — E0590 Thyrotoxicosis, unspecified without thyrotoxic crisis or storm: Secondary | ICD-10-CM

## 2021-06-02 DIAGNOSIS — G4700 Insomnia, unspecified: Secondary | ICD-10-CM | POA: Diagnosis not present

## 2021-06-02 DIAGNOSIS — Z0000 Encounter for general adult medical examination without abnormal findings: Secondary | ICD-10-CM | POA: Diagnosis not present

## 2021-06-02 DIAGNOSIS — R9431 Abnormal electrocardiogram [ECG] [EKG]: Secondary | ICD-10-CM | POA: Diagnosis not present

## 2021-06-02 DIAGNOSIS — Z8639 Personal history of other endocrine, nutritional and metabolic disease: Secondary | ICD-10-CM | POA: Diagnosis not present

## 2021-06-02 DIAGNOSIS — I459 Conduction disorder, unspecified: Secondary | ICD-10-CM

## 2021-06-02 DIAGNOSIS — I10 Essential (primary) hypertension: Secondary | ICD-10-CM | POA: Diagnosis not present

## 2021-06-02 LAB — CBC WITH DIFFERENTIAL/PLATELET
Basophils Absolute: 0 10*3/uL (ref 0.0–0.1)
Basophils Relative: 0.7 % (ref 0.0–3.0)
Eosinophils Absolute: 0.2 10*3/uL (ref 0.0–0.7)
Eosinophils Relative: 4.1 % (ref 0.0–5.0)
HCT: 36.8 % (ref 36.0–46.0)
Hemoglobin: 12 g/dL (ref 12.0–15.0)
Lymphocytes Relative: 30.5 % (ref 12.0–46.0)
Lymphs Abs: 1.8 10*3/uL (ref 0.7–4.0)
MCHC: 32.7 g/dL (ref 30.0–36.0)
MCV: 92.7 fl (ref 78.0–100.0)
Monocytes Absolute: 0.4 10*3/uL (ref 0.1–1.0)
Monocytes Relative: 7.1 % (ref 3.0–12.0)
Neutro Abs: 3.4 10*3/uL (ref 1.4–7.7)
Neutrophils Relative %: 57.6 % (ref 43.0–77.0)
Platelets: 267 10*3/uL (ref 150.0–400.0)
RBC: 3.97 Mil/uL (ref 3.87–5.11)
RDW: 14 % (ref 11.5–15.5)
WBC: 6 10*3/uL (ref 4.0–10.5)

## 2021-06-02 LAB — LIPID PANEL
Cholesterol: 171 mg/dL (ref 0–200)
HDL: 51.8 mg/dL (ref >39.00)
LDL Cholesterol: 103 mg/dL — ABNORMAL HIGH (ref 0–99)
NonHDL: 118.89
Total CHOL/HDL Ratio: 3
Triglycerides: 80 mg/dL (ref 0.0–149.0)
VLDL: 16 mg/dL (ref 0.0–40.0)

## 2021-06-02 LAB — COMPREHENSIVE METABOLIC PANEL
ALT: 21 U/L (ref 0–35)
AST: 23 U/L (ref 0–37)
Albumin: 4.3 g/dL (ref 3.5–5.2)
Alkaline Phosphatase: 73 U/L (ref 39–117)
BUN: 18 mg/dL (ref 6–23)
CO2: 25 mEq/L (ref 19–32)
Calcium: 9.5 mg/dL (ref 8.4–10.5)
Chloride: 105 mEq/L (ref 96–112)
Creatinine, Ser: 0.91 mg/dL (ref 0.40–1.20)
GFR: 61.26 mL/min (ref >60.00)
Glucose, Bld: 88 mg/dL (ref 70–99)
Potassium: 3.9 mEq/L (ref 3.5–5.1)
Sodium: 139 mEq/L (ref 135–145)
Total Bilirubin: 0.4 mg/dL (ref 0.2–1.2)
Total Protein: 7.2 g/dL (ref 6.0–8.3)

## 2021-06-02 LAB — TSH: TSH: 1.76 u[IU]/mL (ref 0.35–5.50)

## 2021-06-02 LAB — HEMOGLOBIN A1C: Hgb A1c MFr Bld: 5.9 % (ref 4.6–6.5)

## 2021-06-02 MED ORDER — MECLIZINE HCL 25 MG PO TABS: 25.0000 mg | ORAL_TABLET | Freq: Three times a day (TID) | ORAL | 0 refills | Status: DC | PRN

## 2021-06-02 MED ORDER — QUETIAPINE FUMARATE 50 MG PO TABS: 25.0000 mg | ORAL_TABLET | Freq: Every day | ORAL | 2 refills | Status: DC

## 2021-06-02 NOTE — Patient Instructions (Addendum)
Ms. Margaret Torres to meet you.  Exam today showed those skipped beats, will place referral to cardiology for consult  Labs today should be back by this afternoon. I'll call you with any urgent results.  I have refilled meclizine  I have sent over seroquel (quetiapine). Take 1/2 to 1 tab about an hour before bed. It should help you fall asleep and stay asleep. If it's not enough, it's ok to increase to 1 full tablet. After that, call me if there's any concerns.  Follow up with Dr. Birdie Riddle in 6 mo  Thank you  Rich     If you have lab work done today you will be contacted with your lab results within the next 2 weeks.  If you have not heard from [name] then please contact us. The fastest way to get your results is to register for My Chart.   IF you received an x-ray today, you will receive an invoice from [facility]. Please contact [facility] at [phone] with questions or concerns regarding your invoice.   IF you received labwork today, you will receive an invoice from [place]. Please contact [facility] at [phone] with questions or concerns regarding your invoice.   Our billing staff will not be able to assist you with questions regarding bills from these companies.  You will be contacted with the lab results as soon as they are available. The fastest way to get your results is to activate your My Chart account. Instructions are located on the last page of this paperwork. If you have not heard from [name] regarding the results in 2 weeks, please contact this office.

## 2021-06-02 NOTE — Progress Notes (Signed)
Established Patient Office Visit  Subjective:  [name], female    DOB: 09-26-1946  Age: 75 y.o. MRN: [id number]  CC:  Chief Complaint  Patient presents with   Annual Exam    Patient states she is here for a CPE. Patient states she is having some trouble sleeping    HPI Margaret Torres presents for CPE and insomina  Insomnia Has been on belsomra and trazodone in the past Gone through a number of losses in her family recently. Has been on clonazepam 0.'5mg'$  po bid, as such, PCP Dr. Birdie Riddle hesitant to add benzo or ambien for sleep She is not interested in taking daily anxiolytic / SSRI / SNRI  Hypertension: Patient Currently taking: amlodipine '10mg'$  PO qd Good effect. No AEs. Denies CV symptoms including: chest pain, shob, doe, headache, visual changes, fatigue, claudication, and dependent edema.   Previous readings and labs: BP Readings from Last 3 Encounters:  06/02/21 126/72  04/21/21 130/80  03/13/21 130/80   Lab Results  Component Value Date   CREATININE 0.96 11/28/2020      Past Medical History:  Diagnosis Date   Anxiety    Cervical cancer ([place])    Depression    Hypertension     Past Surgical History:  Procedure Laterality Date   ABDOMINAL HYSTERECTOMY     WISDOM TOOTH EXTRACTION Bilateral     Family History  Problem Relation Age of Onset   Cervical cancer Mother    Hypertension Mother    Hypertension Father    Heart disease Brother    Diabetes Brother    Stroke Maternal Grandmother    Cancer Other    Diabetes Sister    Stroke Sister    Cancer Sister        bone cancer   Breast cancer Sister    Heart attack Son    Thyroid disease Neg Hx     Social History   Socioeconomic History   Marital status: Divorced    Spouse name: Not on file   Number of children: 3   Years of education: Not on file   Highest education level: Not on file  Occupational History   Occupation: Retired   Tobacco Use   Smoking status: Never    Smokeless tobacco: Never  Substance and Sexual Activity   Alcohol use: No   Drug use: No   Sexual activity: Not Currently    Birth control/protection: Post-menopausal  Other Topics Concern   Not on file  Social History Narrative   Hobbies: enjoys fishing    1 Daughter that lives locally    1 Son lives in [place] Son deceased    Social Determinants of Health   Financial Resource Strain: Low Risk    Difficulty of Paying Living Expenses: Not hard at all  Food Insecurity: No Food Insecurity   Worried About Charity fundraiser in the Last Year: Never true   Arboriculturist in the Last Year: Never true  Transportation Needs: No Transportation Needs   Lack of Transportation (Medical): No   Lack of Transportation (Non-Medical): No  Physical Activity: Sufficiently Active   Days of Exercise per Week: 7 days   Minutes of Exercise per Session: 30 min  Stress: No Stress Concern Present   Feeling of Stress : Only a little  Social Connections: Moderately Isolated   Frequency of Communication with Friends and Family: More than three times a week  Frequency of Social Gatherings with Friends and Family: More than three times a week   Attends Religious Services: More than 4 times per year   Active Member of Clubs or Organizations: No   Attends Archivist Meetings: Never   Marital Status: Widowed  Human resources officer Violence: Not At Risk   Fear of Current or Ex-Partner: No   Emotionally Abused: No   Physically Abused: No   Sexually Abused: No    Outpatient Medications Prior to Visit  Medication Sig Dispense Refill   amLODipine (NORVASC) 10 MG tablet Take 1 tablet by mouth once daily 90 tablet 1   aspirin 325 MG tablet Take 162.5 mg by mouth daily.     clonazePAM (KLONOPIN) 0.5 MG tablet Take 1 tablet (0.5 mg total) by mouth 2 (two) times daily as needed. 60 tablet 1   dextromethorphan-guaiFENesin (MUCINEX DM) 30-600 MG 12hr tablet Take 1 tablet by mouth 2  (two) times daily as needed for cough.     fluticasone (FLONASE) 50 MCG/ACT nasal spray Use 2 spray(s) in each nostril once daily 16 g 0   loratadine (CLARITIN) 10 MG tablet Take 1 tablet (10 mg total) by mouth daily as needed for allergies. Take 10 mg by mouth daily as needed for allergies. 30 tablet 2   meclizine (ANTIVERT) 25 MG tablet Take 1 tablet (25 mg total) by mouth 3 (three) times daily as needed for dizziness. 30 tablet 0   No facility-administered medications prior to visit.    Allergies  Allergen Reactions   Influenza Vaccines     Rash, tongue swelling    ROS Review of Systems  Constitutional: Negative.   HENT: Negative.    Eyes: Negative.   Respiratory: Negative.    Cardiovascular: Negative.   Gastrointestinal: Negative.   Genitourinary: Negative.   Musculoskeletal: Negative.   Skin: Negative.   Neurological: Negative.   Psychiatric/Behavioral:  Positive for sleep disturbance. The patient is nervous/anxious.   All other systems reviewed and are negative.    Objective:    Physical Exam Vitals and nursing note reviewed.  Constitutional:      General: She is not in acute distress.    Appearance: Normal appearance. She is normal weight. She is not ill-appearing, toxic-appearing or diaphoretic.  HENT:     Head: Normocephalic and atraumatic.     Right Ear: Tympanic membrane, ear canal and external ear normal. There is no impacted cerumen.     Left Ear: Tympanic membrane, ear canal and external ear normal. There is no impacted cerumen.     Nose: Nose normal. No congestion or rhinorrhea.     Mouth/Throat:     Mouth: Mucous membranes are moist.     Pharynx: Oropharynx is clear. No oropharyngeal exudate or posterior oropharyngeal erythema.  Eyes:     General: No scleral icterus.       Right eye: No discharge.        Left eye: No discharge.     Extraocular Movements: Extraocular movements intact.     Conjunctiva/sclera: Conjunctivae normal.     Pupils: Pupils are  equal, round, and reactive to light.  Cardiovascular:     Rate and Rhythm: Normal rate. Rhythm irregular.     Pulses: Normal pulses.     Heart sounds: Normal heart sounds. No murmur heard.   No friction rub. No gallop.     Comments: Occ skipped beats Pulmonary:     Effort: Pulmonary effort is normal. No respiratory distress.  Breath sounds: Normal breath sounds. No stridor. No wheezing, rhonchi or rales.  Chest:     Chest wall: No tenderness.  Abdominal:     General: Abdomen is flat. Bowel sounds are normal. There is no distension.     Palpations: Abdomen is soft. There is no mass.     Tenderness: There is no abdominal tenderness. There is no right CVA tenderness, left CVA tenderness, guarding or rebound.     Hernia: No hernia is present.  Musculoskeletal:        General: No swelling, tenderness, deformity or signs of injury. Normal range of motion.     Right lower leg: No edema.     Left lower leg: No edema.  Skin:    General: Skin is warm and dry.     Capillary Refill: Capillary refill takes less than 2 seconds.     Coloration: Skin is not jaundiced or pale.     Findings: No bruising, erythema, lesion or rash.  Neurological:     General: No focal deficit present.     Mental Status: She is alert and oriented to person, place, and time. Mental status is at baseline.     Cranial Nerves: No cranial nerve deficit.     Sensory: No sensory deficit.     Motor: No weakness.     Coordination: Coordination normal.     Gait: Gait normal.     Deep Tendon Reflexes: Reflexes normal.  Psychiatric:        Mood and Affect: Mood normal.        Behavior: Behavior normal.        Thought Content: Thought content normal.        Judgment: Judgment normal.    BP 126/72   Pulse 76   Temp 98.1 F (36.7 C) (Temporal)   Resp 18   Ht '5\' 4"'$  (1.626 m)   Wt 156 lb 3.2 oz (70.9 kg)   LMP 06/30/2013   SpO2 99%   BMI 26.81 kg/m  Wt Readings from Last 3 Encounters:  06/02/21 156 lb 3.2 oz (70.9  kg)  04/21/21 155 lb 6.4 oz (70.5 kg)  03/13/21 154 lb (69.9 kg)     There are no preventive care reminders to display for this patient.  There are no preventive care reminders to display for this patient.  Lab Results  Component Value Date   TSH 2.09 11/28/2020   Lab Results  Component Value Date   WBC 6.5 11/28/2020   HGB 12.3 11/28/2020   HCT 37.2 11/28/2020   MCV 90.6 11/28/2020   PLT 286.0 11/28/2020   Lab Results  Component Value Date   NA 137 11/28/2020   K 3.7 11/28/2020   CO2 27 11/28/2020   GLUCOSE 91 11/28/2020   BUN 19 11/28/2020   CREATININE 0.96 11/28/2020   BILITOT 0.5 11/28/2020   ALKPHOS 69 11/28/2020   AST 24 11/28/2020   ALT 24 11/28/2020   PROT 7.6 11/28/2020   ALBUMIN 4.5 11/28/2020   CALCIUM 9.5 11/28/2020   ANIONGAP 9 03/01/2015   GFR 57.66 (L) 11/28/2020   Lab Results  Component Value Date   CHOL 171 11/28/2020   Lab Results  Component Value Date   HDL 53.20 11/28/2020   Lab Results  Component Value Date   LDLCALC 103 (H) 11/28/2020   Lab Results  Component Value Date   TRIG 72.0 11/28/2020   Lab Results  Component Value Date   CHOLHDL 3 11/28/2020   No  results found for: HGBA1C    Assessment & Plan:   Problem List Items Addressed This Visit       Cardiovascular and Mediastinum   Essential hypertension   Relevant Orders   CBC with Differential/Platelet   Comprehensive metabolic panel     Endocrine   Hyperthyroidism   Relevant Orders   TSH     Other   Insomnia - Primary   Relevant Medications   QUEtiapine (SEROQUEL) 50 MG tablet   Physical exam   Other Visit Diagnoses     Benign paroxysmal positional vertigo, unspecified laterality       Relevant Medications   meclizine (ANTIVERT) 25 MG tablet   Skipped heart beats       Relevant Orders   EKG 12-Lead (Completed)   Comprehensive metabolic panel   Lipid panel   TSH   Ambulatory referral to Cardiology   History of elevated glucose       Relevant  Orders   Hemoglobin A1c   Abnormal EKG       Relevant Orders   Ambulatory referral to Cardiology       Meds ordered this encounter  Medications   meclizine (ANTIVERT) 25 MG tablet    Sig: Take 1 tablet (25 mg total) by mouth 3 (three) times daily as needed for dizziness.    Dispense:  30 tablet    Refill:  0   QUEtiapine (SEROQUEL) 50 MG tablet    Sig: Take 0.5-1 tablets (25-50 mg total) by mouth at bedtime.    Dispense:  30 tablet    Refill:  2    Order Specific Question:   Supervising Provider    Answer:   Carlota Raspberry, JEFFREY R [id number]    Follow-up: Return in about 6 months (around 12/03/2021) for chronic conditions.   PLAN Exam shows skipped heart beats, otherwise unremarkable EKG today compared to 2/10/18/20 : today shows frequent PEC, question of RBBB, rate considerably slower than last EKG. Changes noted. Will refer to cardiology.  Refill meclizine Start seroquel 25-'50mg'$  po qhs prn for sleep Labs collected. Will follow up with the patient as warranted. Patient encouraged to call clinic with any questions, comments, or concerns.  Maximiano Coss, NP

## 2021-06-11 ENCOUNTER — Telehealth: Payer: Self-pay

## 2021-06-11 ENCOUNTER — Other Ambulatory Visit: Payer: Self-pay | Admitting: Family Medicine

## 2021-06-11 NOTE — Progress Notes (Signed)
    Chronic Care Management Pharmacy Assistant   Name: Margaret Torres  MRN: [id number] DOB: [birth year]   Reason for Encounter: Disease State - Hypertension Call    Recent office visits:  06/02/21 Nedra Hai, NP - Family Medicine - Insomnia - Labs were ordered. EKG performed. Referral placed for Cardiology. QUEtiapine (SEROQUEL) 50 MG tablet 1/2-1 tablet daily at bedtime prescribed. Follow up in 6 months.   Recent consult visits:  None noted.   Hospital visits:  None in previous 6 months  Medications: Outpatient Encounter Medications as of 06/11/2021  Medication Sig   amLODipine (NORVASC) 10 MG tablet Take 1 tablet by mouth once daily   aspirin 325 MG tablet Take 162.5 mg by mouth daily.   clonazePAM (KLONOPIN) 0.5 MG tablet Take 1 tablet (0.5 mg total) by mouth 2 (two) times daily as needed.   dextromethorphan-guaiFENesin (MUCINEX DM) 30-600 MG 12hr tablet Take 1 tablet by mouth 2 (two) times daily as needed for cough.   fluticasone (FLONASE) 50 MCG/ACT nasal spray Use 2 spray(s) in each nostril once daily   loratadine (CLARITIN) 10 MG tablet Take 1 tablet (10 mg total) by mouth daily as needed for allergies. Take 10 mg by mouth daily as needed for allergies.   meclizine (ANTIVERT) 25 MG tablet Take 1 tablet (25 mg total) by mouth 3 (three) times daily as needed for dizziness.   QUEtiapine (SEROQUEL) 50 MG tablet Take 0.5-1 tablets (25-50 mg total) by mouth at bedtime.   No facility-administered encounter medications on file as of 06/11/2021.    Current antihypertensive regimen:  Amlodipine (NORVASC) 10 MG tablet  How often are you checking your Blood Pressure?  Patient reported she is checking her blood pressures daily.   Current home BP readings: 117/76 today (before medication)   What recent interventions/DTPs have been made by any provider to improve Blood Pressure control since last CPP Visit:  Patient denied any recent changes in her medication regimen.    Any  recent hospitalizations or ED visits since last visit with CPP?  Patient has not had any hospitalizations or ED visits cine last CPP visit.    What diet changes have been made to improve Blood Pressure Control?  Patient reports she is being careful with her salt intake.   What exercise is being done to improve your Blood Pressure Control?  She reported she is as active as she can. She does her own yard and house work. She uses the riding lawnmower and leaf blower early in the mornings.     Adherence Review: Is the patient currently on ACE/ARB medication? No Does the patient have >5 day gap between last estimated fill dates? No  Amlodipine (NORVASC) 10 MG tablet - last filled 02/19/21 90 days (patient getting refilled this week has extra on hand she was taking)  Star Rating Drugs: No Star Rating Drugs noted.  Future Appointments  Date Time Provider [place]  08/07/2021 10:00 AM O'Neal, Cassie Freer, MD [facility]  08/27/2021  1:30 PM [facility] CCM PHARMACIST [facility] [place], [place] Clinical Pharmacist Assistant  [phone]  Time Spent: 30 minutes

## 2021-06-11 NOTE — Telephone Encounter (Signed)
Called patient back with lab results per provider. Patient voiced understanding

## 2021-06-11 NOTE — Telephone Encounter (Signed)
Pt is calling back on her blood work results   Pt call back [phone]

## 2021-06-29 ENCOUNTER — Other Ambulatory Visit: Payer: Self-pay | Admitting: Family Medicine

## 2021-06-29 DIAGNOSIS — Z1231 Encounter for screening mammogram for malignant neoplasm of breast: Secondary | ICD-10-CM

## 2021-07-09 ENCOUNTER — Other Ambulatory Visit: Payer: Self-pay | Admitting: Family Medicine

## 2021-07-09 NOTE — Telephone Encounter (Signed)
UTD on visits, Please escribe

## 2021-07-31 ENCOUNTER — Ambulatory Visit
Admission: RE | Admit: 2021-07-31 | Discharge: 2021-07-31 | Disposition: A | Discharge status: Home / Self Care | Payer: Medicare HMO | Source: Ambulatory Visit | Attending: Family Medicine | Admitting: Family Medicine

## 2021-07-31 ENCOUNTER — Other Ambulatory Visit: Payer: Self-pay

## 2021-07-31 DIAGNOSIS — Z1231 Encounter for screening mammogram for malignant neoplasm of breast: Secondary | ICD-10-CM

## 2021-08-04 ENCOUNTER — Telehealth: Payer: Self-pay

## 2021-08-04 NOTE — Progress Notes (Signed)
Cardiology Office Note:   Date:  08/07/2021  NAME:  Margaret Torres    MRN: [id number] DOB:  [birth year]   PCP:  Midge Minium, MD  Cardiologist:  None  Electrophysiologist:  None   Referring MD: Maximiano Coss, NP   Chief Complaint  Patient presents with   Abnormal ECG   History of Present Illness:   Margaret Torres is a 75 y.o. female with a hx of HTN who is being seen today for the evaluation of palpitations at the request of Maximiano Coss, NP.  Seen by primary care physician in August.  Noted to have irregular heartbeat on cardiovascular auscultation.  EKG showed sinus rhythm with PACs and aberrantly conducted complexes.  She reports she does not feel these.  She has no chest pain or trouble breathing.  No rapid or irregular heartbeat is reported.  EKG in office demonstrates sinus rhythm with PACs with aberrant conduction which are Ashman complexes, right bundle branch block morphology.  Her medical history is significant for hypertension.  She has never had a heart attack or stroke.  She takes aspirin 162.5 mg daily.  No real reason to do this.  I instructed her to stop.  Most recent lipid profile shows total cholesterol 171, HDL 52, LDL 103, triglycerides 80.  She is not diabetic.  TSH 1.76.  She informs me she has no symptoms of chest pain or trouble breathing.  She works in her yard without any limitations.  She was diagnosed with bronchitis in February after her COVID-19 booster shot.  Chest x-ray does confirm this.  She still has rhonchi on exam consistent with bronchitis.  It does not appear to bother her and she does notices some time.  She denies any symptoms of congestive heart failure.  She is unaware of her PACs.  We discussed that there is no indication to treat this aggressively.  No strong family history of heart disease.  She is a never smoker.  She does not drink alcohol or use drugs.  She has several children and several grandchildren.  No issues reported.  T chol 171,  HDL 51, LDL 103, TG 80 A1c 5.9 TSH 1.76  Past Medical History: Past Medical History:  Diagnosis Date   Anxiety    Cervical cancer (HCC)    Depression    Hypertension     Past Surgical History: Past Surgical History:  Procedure Laterality Date   ABDOMINAL HYSTERECTOMY     WISDOM TOOTH EXTRACTION Bilateral     Current Medications: Current Meds  Medication Sig   amLODipine (NORVASC) 10 MG tablet Take 1 tablet by mouth once daily   clonazePAM (KLONOPIN) 0.5 MG tablet Take 1 tablet by mouth twice daily as needed   dextromethorphan-guaiFENesin (MUCINEX DM) 30-600 MG 12hr tablet Take 1 tablet by mouth 2 (two) times daily as needed for cough.   fluticasone (FLONASE) 50 MCG/ACT nasal spray Use 2 spray(s) in each nostril once daily   loratadine (CLARITIN) 10 MG tablet Take 1 tablet (10 mg total) by mouth daily as needed for allergies. Take 10 mg by mouth daily as needed for allergies.   meclizine (ANTIVERT) 25 MG tablet Take 1 tablet (25 mg total) by mouth 3 (three) times daily as needed for dizziness.   QUEtiapine (SEROQUEL) 50 MG tablet Take 0.5-1 tablets (25-50 mg total) by mouth at bedtime.   [DISCONTINUED] aspirin 325 MG tablet Take 162.5 mg by mouth daily.     Allergies:    Influenza vaccines   Social  History: Social History   Socioeconomic History   Marital status: Divorced    Spouse name: Not on file   Number of children: 3   Years of education: Not on file   Highest education level: Not on file  Occupational History   Occupation: Retired    Occupation: retired - sales/marketing  Tobacco Use   Smoking status: Never   Smokeless tobacco: Never  Substance and Sexual Activity   Alcohol use: No   Drug use: No   Sexual activity: Not Currently    Birth control/protection: Post-menopausal  Other Topics Concern   Not on file  Social History Narrative   Hobbies: enjoys fishing    1 Daughter that lives locally    1 Son lives in [place]    3 [place]    1 Son deceased     Social Determinants of Health   Financial Resource Strain: Low Risk    Difficulty of Paying Living Expenses: Not hard at all  Food Insecurity: No Food Insecurity   Worried About Charity fundraiser in the Last Year: Never true   Arboriculturist in the Last Year: Never true  Transportation Needs: No Transportation Needs   Lack of Transportation (Medical): No   Lack of Transportation (Non-Medical): No  Physical Activity: Sufficiently Active   Days of Exercise per Week: 7 days   Minutes of Exercise per Session: 30 min  Stress: No Stress Concern Present   Feeling of Stress : Only a little  Social Connections: Moderately Isolated   Frequency of Communication with Friends and Family: More than three times a week   Frequency of Social Gatherings with Friends and Family: More than three times a week   Attends Religious Services: More than 4 times per year   Active Member of [employer] or Organizations: No   Attends Archivist Meetings: Never   Marital Status: Widowed     Family History: The patient's family history includes Breast cancer in her sister; Cancer in her sister and another family member; Cervical cancer in her mother; Diabetes in her brother and sister; Heart attack in her son; Heart disease in her brother; Hypertension in her father and mother; Stroke in her maternal grandmother and sister. There is no history of Thyroid disease.  ROS:   All other ROS reviewed and negative. Pertinent positives noted in the HPI.     EKGs/Labs/Other Studies Reviewed:   The following studies were personally reviewed by me today:  EKG:  EKG is ordered today.  The ekg ordered today demonstrates normal sinus rhythm heart rate 65, PACs with aberrant conduction, and was personally reviewed by me.   Recent Labs: 06/02/2021: ALT 21; BUN 18; Creatinine, Ser 0.91; Hemoglobin 12.0; Platelets 267.0; Potassium 3.9; Sodium 139; TSH 1.76   Recent Lipid Panel    Component Value Date/Time   CHOL  171 06/02/2021 1008   TRIG 80.0 06/02/2021 1008   HDL 51.80 06/02/2021 1008   CHOLHDL 3 06/02/2021 1008   VLDL 16.0 06/02/2021 1008   LDLCALC 103 (H) 06/02/2021 1008    Physical Exam:   VS:  BP (!) 145/68   Pulse 65   Ht 5' 4.5" (1.638 m)   Wt 157 lb 12.8 oz (71.6 kg)   LMP 06/30/2013   SpO2 100%   BMI 26.67 kg/m    Wt Readings from Last 3 Encounters:  08/07/21 157 lb 12.8 oz (71.6 kg)  06/02/21 156 lb 3.2 oz (70.9 kg)  04/21/21 155 lb  6.4 oz (70.5 kg)    General: Well nourished, well developed, in no acute distress Head: Atraumatic, normal size  Eyes: PEERLA, EOMI  Neck: Supple, no JVD Endocrine: No thryomegaly Cardiac: Normal S1, S2; irregular rhythm, no murmurs Lungs: Clear to auscultation bilaterally, no wheezing, rhonchi or rales  Abd: Soft, nontender, no hepatomegaly  Ext: No edema, pulses 2+ Musculoskeletal: No deformities, BUE and BLE strength normal and equal Skin: Warm and dry, no rashes   Neuro: Alert and oriented to person, place, time, and situation, CNII-XII grossly intact, no focal deficits  Psych: Normal mood and affect   ASSESSMENT:   Kinlie Janice is a 75 y.o. female who presents for the following: 1. PAC (premature atrial contraction)   2. Nonspecific abnormal electrocardiogram (ECG) (EKG)     PLAN:   1. PAC (premature atrial contraction) 2. Nonspecific abnormal electrocardiogram (ECG) (EKG) -EKG demonstrates sinus rhythm with PACs and aberrantly conducted complexes.  These are Ashman complexes which are right bundle branch block and morphology.  Her PACs are benign.  Recent TSH is normal.  She does not feel them.  No indication to treat these.  I recommended an echocardiogram just to make sure everything is normal.  Her cardiovascular examination is normal.  These really are benign entity.  She has no cardiovascular symptoms.  She denies chest pain or trouble breathing.  She can maintain high level of activity without limitations.  I really see no  need for further testing other than an echocardiogram to make sure everything is normal.  Says she is unaware of them we will not treat them.  She will see [name] back as needed.  Disposition: Return if symptoms worsen or fail to improve.  Medication Adjustments/Labs and Tests Ordered: Current medicines are reviewed at length with the patient today.  Concerns regarding medicines are outlined above.  Orders Placed This Encounter  Procedures   EKG 12-Lead   ECHOCARDIOGRAM COMPLETE    No orders of the defined types were placed in this encounter.   Patient Instructions  Medication Instructions:  Stop Aspirin   *If you need a refill on your cardiac medications before your next appointment, please call your pharmacy*   Testing/Procedures: Echocardiogram - Your physician has requested that you have an echocardiogram. Echocardiography is a painless test that uses sound waves to create images of your heart. It provides your doctor with information about the size and shape of your heart and how well your heart's chambers and valves are working. This procedure takes approximately one hour. There are no restrictions for this procedure. This will be performed at our [facility] location - [address].    Follow-Up: At [facility], you and your health needs are our priority.  As part of our continuing mission to provide you with exceptional heart care, we have created designated Provider Care Teams.  These Care Teams include your primary Cardiologist (physician) and Advanced Practice Providers (APPs -  Physician Assistants and Nurse Practitioners) who all work together to provide you with the care you need, when you need it.  We recommend signing up for the patient portal called "MyChart".  Sign up information is provided on this After Visit Summary.  MyChart is used to connect with patients for Virtual Visits (Telemedicine).  Patients are able to view lab/test results, encounter notes,  upcoming appointments, etc.  Non-urgent messages can be sent to your provider as well.   To learn more about what you can do with MyChart,  go to [email or web address].    Your next appointment:   As needed  The format for your next appointment:   In Person  Provider:   Eleonore Chiquito, MD    Signed, Addison Naegeli. Audie Box, MD, [place]  [address] [place],  [address] [phone]  08/07/2021 11:18 AM

## 2021-08-04 NOTE — Telephone Encounter (Signed)
Caller name:Lamica Owens Shark   On DPR? :Yes  Call back number:[phone]  Provider they see: Birdie Riddle   Reason for call:Pt wants to be referred to ophthalmologist per [facility] has to be in [place] but she can not give name of a provider to be sent to?

## 2021-08-04 NOTE — Telephone Encounter (Signed)
Dr Birdie Riddle, Is it ok to place a referral for patient? Please advise

## 2021-08-05 NOTE — Telephone Encounter (Signed)
Ok to place referral but she may need to contact [facility] and get a list of participating providers

## 2021-08-05 NOTE — Telephone Encounter (Signed)
Called patient to speak with her about a referral. Left message to return my call.

## 2021-08-05 NOTE — Telephone Encounter (Signed)
Returned [place] phone call

## 2021-08-06 ENCOUNTER — Other Ambulatory Visit: Payer: Self-pay | Admitting: Family Medicine

## 2021-08-06 DIAGNOSIS — J309 Allergic rhinitis, unspecified: Secondary | ICD-10-CM

## 2021-08-07 ENCOUNTER — Ambulatory Visit: Payer: Medicare HMO | Admitting: Cardiovascular Disease

## 2021-08-07 ENCOUNTER — Encounter: Payer: Self-pay | Admitting: Cardiovascular Disease

## 2021-08-07 ENCOUNTER — Other Ambulatory Visit: Payer: Self-pay

## 2021-08-07 VITALS — BP 145/68 | HR 65 | Ht 64.5 in | Wt 157.8 lb

## 2021-08-07 DIAGNOSIS — R9431 Abnormal electrocardiogram [ECG] [EKG]: Secondary | ICD-10-CM

## 2021-08-07 DIAGNOSIS — I491 Atrial premature depolarization: Secondary | ICD-10-CM | POA: Diagnosis not present

## 2021-08-07 DIAGNOSIS — R002 Palpitations: Secondary | ICD-10-CM

## 2021-08-07 NOTE — Patient Instructions (Addendum)
Medication Instructions:  Stop Aspirin   *If you need a refill on your cardiac medications before your next appointment, please call your pharmacy*   Testing/Procedures: Echocardiogram - Your physician has requested that you have an echocardiogram. Echocardiography is a painless test that uses sound waves to create images of your heart. It provides your doctor with information about the size and shape of your heart and how well your heart's chambers and valves are working. This procedure takes approximately one hour. There are no restrictions for this procedure. This will be performed at our [facility] location - [address].    Follow-Up: At [facility], you and your health needs are our priority.  As part of our continuing mission to provide you with exceptional heart care, we have created designated Provider Care Teams.  These Care Teams include your primary Cardiologist (physician) and Advanced Practice Providers (APPs -  Physician Assistants and Nurse Practitioners) who all work together to provide you with the care you need, when you need it.  We recommend signing up for the patient portal called "MyChart".  Sign up information is provided on this After Visit Summary.  MyChart is used to connect with patients for Virtual Visits (Telemedicine).  Patients are able to view lab/test results, encounter notes, upcoming appointments, etc.  Non-urgent messages can be sent to your provider as well.   To learn more about what you can do with MyChart, go to [email or web address].    Your next appointment:   As needed  The format for your next appointment:   In Person  Provider:   Eleonore Chiquito, MD

## 2021-08-12 ENCOUNTER — Telehealth: Payer: Self-pay

## 2021-08-12 ENCOUNTER — Ambulatory Visit (INDEPENDENT_AMBULATORY_CARE_PROVIDER_SITE_OTHER): Payer: Medicare HMO

## 2021-08-12 DIAGNOSIS — F32A Depression, unspecified: Secondary | ICD-10-CM

## 2021-08-12 DIAGNOSIS — F419 Anxiety disorder, unspecified: Secondary | ICD-10-CM

## 2021-08-12 DIAGNOSIS — I10 Essential (primary) hypertension: Secondary | ICD-10-CM

## 2021-08-12 DIAGNOSIS — E785 Hyperlipidemia, unspecified: Secondary | ICD-10-CM

## 2021-08-12 NOTE — Progress Notes (Signed)
Chronic Care Management Pharmacy Note  08/12/2021 Name:  Margaret Torres MRN:  [id number] DOB:  1946-03-20  Summary: -no changes  Subjective: Margaret Torres is an 75 y.o. year old female who is a primary patient of Tabori, Aundra Millet, MD.  The CCM team was consulted for assistance with disease management and care coordination needs.    Engaged with patient by telephone for follow up visit in response to provider referral for pharmacy case management and/or care coordination services.   Consent to Services:  The patient was given information about Chronic Care Management services, agreed to services, and gave verbal consent prior to initiation of services.  Please see initial visit note for detailed documentation.   Patient Care Team: Midge Minium, MD as PCP - General (Family Medicine) Elayne Snare, MD as Consulting Physician (Endocrinology) Carol Ada, MD as Consulting Physician (Gastroenterology) Madelin Rear, [facility] as Pharmacist (Pharmacist)  Objective:  Lab Results  Component Value Date   CREATININE 0.91 06/02/2021   CREATININE 0.96 11/28/2020   CREATININE 1.18 05/28/2020    Lab Results  Component Value Date   HGBA1C 5.9 06/02/2021   Last diabetic Eye exam: No results found for: HMDIABEYEEXA  Last diabetic Foot exam: No results found for: HMDIABFOOTEX      Component Value Date/Time   CHOL 171 06/02/2021 1008   TRIG 80.0 06/02/2021 1008   HDL 51.80 06/02/2021 1008   CHOLHDL 3 06/02/2021 1008   VLDL 16.0 06/02/2021 1008   LDLCALC 103 (H) 06/02/2021 1008    Hepatic Function Latest Ref Rng & Units 06/02/2021 11/28/2020 05/28/2020  Total Protein 6.0 - 8.3 g/dL 7.2 7.6 7.0  Albumin 3.5 - 5.2 g/dL 4.3 4.5 4.3  AST 0 - 37 U/L '23 24 17  ' ALT 0 - 35 U/L '21 24 17  ' Alk Phosphatase 39 - 117 U/L 73 69 70  Total Bilirubin 0.2 - 1.2 mg/dL 0.4 0.5 0.6  Bilirubin, Direct 0.0 - 0.3 mg/dL - 0.1 0.1    Lab Results  Component Value Date/Time   TSH 1.76 06/02/2021  10:08 AM   TSH 2.09 11/28/2020 11:22 AM   FREET4 0.69 06/05/2018 10:12 AM   FREET4 0.67 03/30/2017 11:05 AM    CBC Latest Ref Rng & Units 06/02/2021 11/28/2020 07/21/2020  WBC 4.0 - 10.5 K/uL 6.0 6.5 7.7  Hemoglobin 12.0 - 15.0 g/dL 12.0 12.3 11.7(L)  Hematocrit 36.0 - 46.0 % 36.8 37.2 34.3(L)  Platelets 150.0 - 400.0 K/uL 267.0 286.0 314    No results found for: VD25OH  Clinical ASCVD:  The 10-year ASCVD risk score (Arnett DK, et al., 2019) is: 16.1%   Values used to calculate the score:     Age: 35 years     Sex: Female     Is Non-Hispanic African American: Yes     Diabetic: No     Tobacco smoker: No     Systolic Blood Pressure: 409 mmHg     Is BP treated: Yes     HDL Cholesterol: 51.8 mg/dL     Total Cholesterol: 171 mg/dL     Social History   Tobacco Use  Smoking Status Never  Smokeless Tobacco Never   BP Readings from Last 3 Encounters:  08/07/21 (!) 145/68  06/02/21 126/72  04/21/21 130/80   Pulse Readings from Last 3 Encounters:  08/07/21 65  06/02/21 76  04/21/21 63   Wt Readings from Last 3 Encounters:  08/07/21 157 lb 12.8 oz (71.6 kg)  06/02/21 156 lb 3.2  oz (70.9 kg)  04/21/21 155 lb 6.4 oz (70.5 kg)    Assessment: Review of patient past medical history, allergies, medications, health status, including review of consultants reports, laboratory and other test data, was performed as part of comprehensive evaluation and provision of chronic care management services.   SDOH:  (Social Determinants of Health) assessments and interventions performed: Yes   CCM Care Plan  Allergies  Allergen Reactions   Influenza Vaccines     Rash, tongue swelling    Medications Reviewed Today     Reviewed by Geralynn Rile, MD (Physician) on 08/07/21 at 1102  Med List Status: <None>   Medication Order Taking? Sig Documenting Provider Last Dose Status Informant  amLODipine (NORVASC) 10 MG tablet [id number] Yes Take 1 tablet by mouth once daily Midge Minium, MD Taking Active   aspirin 325 MG tablet [id number] Yes Take 162.5 mg by mouth daily. [provider] Taking Active   clonazePAM (KLONOPIN) 0.5 MG tablet [id number] Yes Take 1 tablet by mouth twice daily as needed Midge Minium, MD Taking Active   dextromethorphan-guaiFENesin [facility] DM) 30-600 MG 12hr tablet [id number] Yes Take 1 tablet by mouth 2 (two) times daily as needed for cough. [provider] Taking Active   fluticasone (FLONASE) 50 MCG/ACT nasal spray [id number] Yes Use 2 spray(s) in each nostril once daily Midge Minium, MD Taking Active   loratadine (CLARITIN) 10 MG tablet [id number] Yes Take 1 tablet (10 mg total) by mouth daily as needed for allergies. Take 10 mg by mouth daily as needed for allergies. Midge Minium, MD Taking Active   meclizine (ANTIVERT) 25 MG tablet [id number] Yes Take 1 tablet (25 mg total) by mouth 3 (three) times daily as needed for dizziness. Maximiano Coss, NP Taking Active   QUEtiapine (SEROQUEL) 50 MG tablet [id number] Yes Take 0.5-1 tablets (25-50 mg total) by mouth at bedtime. Maximiano Coss, NP Taking Active             Patient Active Problem List   Diagnosis Date Noted   Abnormal feces 11/28/2020   First degree hemorrhoids 11/28/2020   Generalized abdominal pain 11/28/2020   Second degree hemorrhoids 11/28/2020   Hemorrhage of rectum and anus 11/28/2020   Wheezing 11/17/2020   Primary osteoarthritis of both knees 10/20/2018   GERD (gastroesophageal reflux disease) 10/12/2018   Multiple falls 10/12/2018   Multiple thyroid nodules 12/30/2017   Diarrhea 09/20/2016   Hyperthyroidism 04/21/2016   Bowel incontinence 04/21/2016   Leg pain, bilateral 03/02/2016   Dysphagia 03/02/2016   Olecranon bursitis of left elbow 01/30/2016   Physical exam 01/27/2015   Bilateral renal cysts 01/27/2015   Left anterior knee pain 09/30/2014   Insomnia 06/20/2014   Hemorrhoids, external 03/27/2014   Anxiety and  depression 07/16/2007   Essential hypertension 07/16/2007   Allergic rhinitis 07/16/2007   Hyperlipidemia 06/26/2007   TAH/BSO, HX OF 06/26/2007    Immunization History  Administered Date(s) Administered   Moderna Sars-Covid-2 Vaccination 02/28/2020, 03/30/2020   Pneumococcal Polysaccharide-23 03/27/2013   Conditions to be addressed/monitored: HTN, HLD, Anxiety, Depression, GERD, and Insomnia  Care Plan : ccm pharmacy care plan  Updates made by Madelin Rear, [place] since 08/12/2021 12:00 AM     Problem: hld htn mdd gad   Priority: High     Long-Range Goal: disease management   Start Date: 08/12/2021  Expected End Date: 08/13/2021  This Visit's Progress: On track  Priority: High  Note:   Hypertension (BP goal <130/80) -Controlled -  Current treatment: Amlodipine 10 mg once daily   -Current home readings: 120/71 08/12/2021 -Current dietary habits: 4 water bottles daily,  -Current exercise habits: low carb, fruits like blue berries. Low salt.  -Denies hypotensive/hypertensive symptoms -Educated on BP goals and benefits of medications for prevention of heart attack, stroke and kidney damage; Daily salt intake goal < 2300 mg; Exercise goal of 150 minutes per week; -Counseled to monitor BP at home 1-2x/wk, document, and provide log at future appointments -Counseled on diet and exercise extensively Recommended to continue current medication  Hyperlipidemia: (LDL goal < 100) -Not ideally controlled -Current treatment: No current medications -Medications previously tried: n/a  -Current dietary patterns: see htn -Current exercise habits: see htn -Educated on Cholesterol goals;  -Recommended to continue current medication  Depression/Anxiety (Goal: minimize symptoms) -Controlled -Current treatment: Clonazepam 0.5 mg twice daily prn Quetiapine 50 mg prn sleep  -Medications previously tried/failed: SSRIs, TCAs -PHQ9: 0 -GAD7: 0 -Declined any additional support  -Educated on  Benefits of medication for symptom control Benefits of cognitive-behavioral therapy with or without medication -Recommended to continue current medication   Medication Assistance: None required.  Patient affirms current coverage meets needs.     Patient's preferred pharmacy is:  [facility] [address] ([address] [place] [address] [place] ([place]) [place] [address] Phone: [phone] Fax: [phone]  [facility] [address] [place] - [address] [place] [place] [address] Phone: [phone] Fax: [phone]  Pt endorses 100% compliance  Follow Up:  Patient agrees to Care Plan and Follow-up.  Plan:  HC f/u wth BP call 6 months  Future Appointments  Date Time Provider [place]  10/20/2021 11:35 AM [facility]

## 2021-08-12 NOTE — Progress Notes (Signed)
    Chronic Care Management Pharmacy Assistant   Name: Margaret Torres  MRN: [id number] DOB: 1946-04-22   Reason for Encounter: Disease State - Hypertension Call     Recent office visits:  None noted.  Recent consult visits:  08/07/21 Margaret Torres  - Cardiology - EKG and ECHO performed. No medication changes. Follow up as needed.   Hospital visits:  None in previous 6 months  Medications: Outpatient Encounter Medications as of 08/12/2021  Medication Sig   amLODipine (NORVASC) 10 MG tablet Take 1 tablet by mouth once daily   clonazePAM (KLONOPIN) 0.5 MG tablet Take 1 tablet by mouth twice daily as needed   dextromethorphan-guaiFENesin (MUCINEX DM) 30-600 MG 12hr tablet Take 1 tablet by mouth 2 (two) times daily as needed for cough.   fluticasone (FLONASE) 50 MCG/ACT nasal spray Use 2 spray(s) in each nostril once daily   loratadine (CLARITIN) 10 MG tablet Take 1 tablet (10 mg total) by mouth daily as needed for allergies. Take 10 mg by mouth daily as needed for allergies.   meclizine (ANTIVERT) 25 MG tablet Take 1 tablet (25 mg total) by mouth 3 (three) times daily as needed for dizziness.   QUEtiapine (SEROQUEL) 50 MG tablet Take 0.5-1 tablets (25-50 mg total) by mouth at bedtime.   No facility-administered encounter medications on file as of 08/12/2021.    Current antihypertensive regimen:  Amlodipine (NORVASC) 10 MG tablet  How often are you checking your Blood Pressure?  Patient reported she is checking her blood pressures    Current home BP readings: 120/70   What recent interventions/DTPs have been made by any provider to improve Blood Pressure control since last CPP Visit:  Patient denied any recent changes in regimen since her last visit with CPP.   Any recent hospitalizations or ED visits since last visit with CPP?  Patient has not had any hospitalizations or ED visits since last visit with CPP.   What diet changes have been made to improve Blood Pressure  Control?  Patient reported she is watching her salt intake daily.   What exercise is being done to improve your Blood Pressure Control?  Patient reported she does her own yard work and works around [employer]. She does go fishing in free time.     Adherence Review: Is the patient currently on ACE/ARB medication? No Does the patient have >5 day gap between last estimated fill dates? No  Amlodipine (NORVASC) 10 MG tablet - last filled 06/17/21 90 days   Care Gaps  AWV: done 02/23/21 Colonoscopy: done 03/27/12 DM Eye Exam: unknown  DM Foot Exam: unknown  Microalbumin: unknown  HbgAIC: done 06/02/21 (5.9) DEXA: due 02/19/22 Mammogram: done 07/31/21   Star Rating Drugs: No Star Rating Drugs noted.  Future Appointments  Date Time Provider [place]  08/12/2021  3:30 PM [facility] PHARMACIST [facility]  10/20/2021 11:35 AM [facility]    Margaret Torres, [place] Pharmacist Assistant  [phone]  Time Spent: 25 minutes

## 2021-08-12 NOTE — Patient Instructions (Addendum)
Ms. Littleton,  Thank you for talking with me today. I have included our care plan/goals in the following pages.   Please review and call me at [phone] with any questions.  Thanks! Ellin Mayhew, PharmD, CPP Clinical Pharmacist Practitioner  [phone]  Care Plan : [facility] pharmacy care plan  Updates made by Madelin Rear, [facility] since 08/12/2021 12:00 AM     Problem: hld htn mdd gad   Priority: High     Long-Range Goal: disease management   Start Date: 08/12/2021  Expected End Date: 08/13/2021  This Visit's Progress: On track  Priority: High  Note:   Hypertension (BP goal <130/80) -Controlled -Current treatment: Amlodipine 10 mg once daily   -Current home readings: 120/71 08/12/2021 -Current dietary habits: 4 water bottles daily,  -Current exercise habits: low carb, fruits like blue berries. Low salt.  -Denies hypotensive/hypertensive symptoms -Educated on BP goals and benefits of medications for prevention of heart attack, stroke and kidney damage; Daily salt intake goal < 2300 mg; Exercise goal of 150 minutes per week; -Counseled to monitor BP at home 1-2x/wk, document, and provide log at future appointments -Counseled on diet and exercise extensively Recommended to continue current medication  Hyperlipidemia: (LDL goal < 100) -Not ideally controlled -Current treatment: No current medications -Medications previously tried: n/a  -Current dietary patterns: see htn -Current exercise habits: see htn -Educated on Cholesterol goals;  -Recommended to continue current medication  Depression/Anxiety (Goal: minimize symptoms) -Controlled -Current treatment: Clonazepam 0.5 mg twice daily prn Quetiapine 50 mg prn sleep  -Medications previously tried/failed: SSRIs, TCAs -PHQ9: 0 -GAD7: 0 -Declined any additional support  -Educated on Benefits of medication for symptom control Benefits of cognitive-behavioral therapy with or without medication -Recommended to  continue current medication   Medication Assistance: None required.  Patient affirms current coverage meets needs.    The patient verbalized understanding of instructions provided today and agreed to receive a MyChart copy of patient instruction and/or educational materials. Telephone follow up appointment with pharmacy team member scheduled for: See next appointment with "Care Management Staff" under "What's Next" below.    High Cholesterol High cholesterol is a condition in which the blood has high levels of a white, waxy substance similar to fat (cholesterol). The liver makes all the cholesterol that the body needs. The human body needs small amounts of cholesterol to help build cells. A person gets extra or excess cholesterol from the food that he or she eats. The blood carries cholesterol from the liver to the rest of the body. If you have high cholesterol, deposits (plaques) may build up on the walls of your arteries. Arteries are the blood vessels that carry blood away from your heart. These plaques make the arteries narrow and stiff. Cholesterol plaques increase your risk for heart attack and stroke. Work with your health care provider to keep your cholesterol levels in a healthy range. What increases the risk? The following factors may make you more likely to develop this condition: Eating foods that are high in animal fat (saturated fat) or cholesterol. Being overweight. Not getting enough exercise. A family history of high cholesterol (familial hypercholesterolemia). Use of tobacco products. Having diabetes. What are the signs or symptoms? In most cases, high cholesterol does not usually cause any symptoms. In severe cases, very high cholesterol levels can cause: Fatty bumps under the skin (xanthomas). A white or gray ring around the black center (pupil) of the eye. How is this diagnosed? This condition may be  diagnosed based on the results of a blood test. If you are older  than 76 years of age, your health care provider may check your cholesterol levels every 4-6 years. You may be checked more often if you have high cholesterol or other risk factors for heart disease. The blood test for cholesterol measures: "Bad" cholesterol, or LDL cholesterol. This is the main type of cholesterol that causes heart disease. The desired level is less than 100 mg/dL (2.59 mmol/L). "Good" cholesterol, or HDL cholesterol. HDL helps protect against heart disease by cleaning the arteries and carrying the LDL to the liver for processing. The desired level for HDL is 60 mg/dL (1.55 mmol/L) or higher. Triglycerides. These are fats that your body can store or burn for energy. The desired level is less than 150 mg/dL (1.69 mmol/L). Total cholesterol. This measures the total amount of cholesterol in your blood and includes LDL, HDL, and triglycerides. The desired level is less than 200 mg/dL (5.17 mmol/L). How is this treated? Treatment for high cholesterol starts with lifestyle changes, such as diet and exercise. Diet changes. You may be asked to eat foods that have more fiber and less saturated fats or added sugar. Lifestyle changes. These may include regular exercise, maintaining a healthy weight, and quitting use of tobacco products. Medicines. These are given when diet and lifestyle changes have not worked. You may be prescribed a statin medicine to help lower your cholesterol levels. Follow these instructions at home: Eating and drinking  Eat a healthy, balanced diet. This diet includes: Daily servings of a variety of fresh, frozen, or canned fruits and vegetables. Daily servings of whole grain foods that are rich in fiber. Foods that are low in saturated fats and trans fats. These include poultry and fish without skin, lean cuts of meat, and low-fat dairy products. A variety of fish, especially oily fish that contain omega-3 fatty acids. Aim to eat fish at least 2 times a week. Avoid  foods and drinks that have added sugar. Use healthy cooking methods, such as roasting, grilling, broiling, baking, poaching, steaming, and stir-frying. Do not fry your food except for stir-frying. If you drink alcohol: Limit how much you have to: 0-1 drink a day for women who are not pregnant. 0-2 drinks a day for men. Know how much alcohol is in a drink. In the [place] one drink equals one 12 oz bottle of beer (355 mL), one 5 oz glass of wine (148 mL), or one 1 oz glass of hard liquor (44 mL). Lifestyle  Get regular exercise. Aim to exercise for a total of 150 minutes a week. Increase your activity level by doing activities such as gardening, walking, and taking the stairs. Do not use any products that contain nicotine or tobacco. These products include cigarettes, chewing tobacco, and vaping devices, such as e-cigarettes. If you need help quitting, ask your health care provider. General instructions Take over-the-counter and prescription medicines only as told by your health care provider. Keep all follow-up visits. This is important. Where to find more information American Heart Association: www.heart.org National Heart, Lung, and Blood Institute: https://wilson-eaton.com/ Contact a health care provider if: You have trouble achieving or maintaining a healthy diet or weight. You are starting an exercise program. You are unable to stop smoking. Get help right away if: You have chest pain. You have trouble breathing. You have discomfort or pain in your jaw, neck, back, shoulder, or arm. You have any symptoms of a stroke. "BE FAST" is an easy  way to remember the main warning signs of a stroke: B - Balance. Signs are dizziness, sudden trouble walking, or loss of balance. E - Eyes. Signs are trouble seeing or a sudden change in vision. F - Face. Signs are sudden weakness or numbness of the face, or the face or eyelid drooping on one side. A - Arms. Signs are weakness or numbness in an arm. This  happens suddenly and usually on one side of the body. S - Speech. Signs are sudden trouble speaking, slurred speech, or trouble understanding what people say. T - Time. Time to call emergency services. Write down what time symptoms started. You have other signs of a stroke, such as: A sudden, severe headache with no known cause. Nausea or vomiting. Seizure. These symptoms may represent a serious problem that is an emergency. Do not wait to see if the symptoms will go away. Get medical help right away. Call your local emergency services (911 in the [place] Do not drive yourself to the hospital. Summary Cholesterol plaques increase your risk for heart attack and stroke. Work with your health care provider to keep your cholesterol levels in a healthy range. Eat a healthy, balanced diet, get regular exercise, and maintain a healthy weight. Do not use any products that contain nicotine or tobacco. These products include cigarettes, chewing tobacco, and vaping devices, such as e-cigarettes. Get help right away if you have any symptoms of a stroke. This information is not intended to replace advice given to you by your health care provider. Make sure you discuss any questions you have with your health care provider. Document Revised: 12/11/2020 Document Reviewed: 12/01/2020 Elsevier Patient Education  2022 [employer].

## 2021-08-13 DIAGNOSIS — H04123 Dry eye syndrome of bilateral lacrimal glands: Secondary | ICD-10-CM | POA: Diagnosis not present

## 2021-08-25 ENCOUNTER — Other Ambulatory Visit: Payer: Self-pay | Admitting: Family Medicine

## 2021-08-25 NOTE — Telephone Encounter (Signed)
Last saw Richard in August, please advise

## 2021-08-25 NOTE — Telephone Encounter (Signed)
Patient is requesting a refill of the following medications: Requested Prescriptions   Pending Prescriptions Disp Refills   clonazePAM (KLONOPIN) 0.5 MG tablet [Pharmacy Med Name: clonazePAM 0.5 MG Oral Tablet] 60 tablet 0    Sig: Take 1 tablet by mouth twice daily as needed    Date of patient request: 08/25/21 Last office visit: 06/02/2021 Date of last refill: 07/10/2021 Last refill amount: 60 tab

## 2021-08-27 ENCOUNTER — Telehealth: Payer: Medicare HMO

## 2021-09-09 DIAGNOSIS — I10 Essential (primary) hypertension: Secondary | ICD-10-CM

## 2021-09-09 DIAGNOSIS — F32A Depression, unspecified: Secondary | ICD-10-CM | POA: Diagnosis not present

## 2021-09-09 DIAGNOSIS — E785 Hyperlipidemia, unspecified: Secondary | ICD-10-CM

## 2021-09-09 DIAGNOSIS — F419 Anxiety disorder, unspecified: Secondary | ICD-10-CM | POA: Diagnosis not present

## 2021-09-10 DIAGNOSIS — H04123 Dry eye syndrome of bilateral lacrimal glands: Secondary | ICD-10-CM | POA: Diagnosis not present

## 2021-09-21 ENCOUNTER — Other Ambulatory Visit: Payer: Self-pay | Admitting: Family Medicine

## 2021-10-08 ENCOUNTER — Other Ambulatory Visit: Payer: Self-pay | Admitting: Family Medicine

## 2021-10-08 DIAGNOSIS — F32A Depression, unspecified: Secondary | ICD-10-CM

## 2021-10-08 NOTE — Addendum Note (Signed)
Addended by: Hildred Priest on: 10/08/2021 04:54 PM   Modules accepted: Orders

## 2021-10-08 NOTE — Telephone Encounter (Signed)
Patient is requesting a refill of the following medications: Requested Prescriptions   Pending Prescriptions Disp Refills   clonazePAM (KLONOPIN) 0.5 MG tablet [Pharmacy Med Name: clonazePAM 0.5 MG Oral Tablet] 60 tablet 0    Sig: Take 1 tablet by mouth twice daily as needed    Date of patient request: 10/08/21 Last office visit: 08/23/21 Date of last refill: 08/25/21 Last refill amount: 60

## 2021-10-08 NOTE — Telephone Encounter (Signed)
Duplicate request one has been sent to provider

## 2021-10-08 NOTE — Telephone Encounter (Signed)
Last refill date was 08/31/21

## 2021-10-09 MED ORDER — CLONAZEPAM 0.5 MG PO TABS: 0.5000 mg | ORAL_TABLET | Freq: Two times a day (BID) | ORAL | 1 refills | Status: DC | PRN

## 2021-10-20 ENCOUNTER — Other Ambulatory Visit: Payer: Self-pay

## 2021-10-20 ENCOUNTER — Ambulatory Visit (HOSPITAL_COMMUNITY): Payer: Medicare HMO | Attending: Cardiology

## 2021-10-20 DIAGNOSIS — R9431 Abnormal electrocardiogram [ECG] [EKG]: Secondary | ICD-10-CM

## 2021-10-20 LAB — ECHOCARDIOGRAM COMPLETE
AR max vel: 2.08 cm2
AV Area VTI: 1.93 cm2
AV Area mean vel: 1.89 cm2
AV Mean grad: 5 mmHg
AV Peak grad: 8.8 mmHg
Ao pk vel: 1.48 m/s
Area-P 1/2: 3.36 cm2
S' Lateral: 2.8 cm

## 2021-10-26 ENCOUNTER — Ambulatory Visit (INDEPENDENT_AMBULATORY_CARE_PROVIDER_SITE_OTHER): Payer: Medicare HMO | Admitting: Family Medicine

## 2021-10-26 ENCOUNTER — Encounter: Payer: Self-pay | Admitting: Family Medicine

## 2021-10-26 ENCOUNTER — Encounter: Payer: Self-pay | Admitting: Psychology

## 2021-10-26 VITALS — Resp 16 | Wt 156.8 lb

## 2021-10-26 DIAGNOSIS — F32A Depression, unspecified: Secondary | ICD-10-CM

## 2021-10-26 DIAGNOSIS — J309 Allergic rhinitis, unspecified: Secondary | ICD-10-CM | POA: Diagnosis not present

## 2021-10-26 DIAGNOSIS — R195 Other fecal abnormalities: Secondary | ICD-10-CM

## 2021-10-26 DIAGNOSIS — F419 Anxiety disorder, unspecified: Secondary | ICD-10-CM

## 2021-10-26 DIAGNOSIS — R42 Dizziness and giddiness: Secondary | ICD-10-CM | POA: Diagnosis not present

## 2021-10-26 DIAGNOSIS — R413 Other amnesia: Secondary | ICD-10-CM

## 2021-10-26 LAB — CBC WITH DIFFERENTIAL/PLATELET
Basophils Absolute: 0 10*3/uL (ref 0.0–0.1)
Basophils Relative: 0.7 % (ref 0.0–3.0)
Eosinophils Absolute: 0.2 10*3/uL (ref 0.0–0.7)
Eosinophils Relative: 2.9 % (ref 0.0–5.0)
HCT: 36.6 % (ref 36.0–46.0)
Hemoglobin: 11.9 g/dL — ABNORMAL LOW (ref 12.0–15.0)
Lymphocytes Relative: 29.4 % (ref 12.0–46.0)
Lymphs Abs: 1.7 10*3/uL (ref 0.7–4.0)
MCHC: 32.5 g/dL (ref 30.0–36.0)
MCV: 91.2 fl (ref 78.0–100.0)
Monocytes Absolute: 0.5 10*3/uL (ref 0.1–1.0)
Monocytes Relative: 7.9 % (ref 3.0–12.0)
Neutro Abs: 3.5 10*3/uL (ref 1.4–7.7)
Neutrophils Relative %: 59.1 % (ref 43.0–77.0)
Platelets: 295 10*3/uL (ref 150.0–400.0)
RBC: 4.01 Mil/uL (ref 3.87–5.11)
RDW: 13.4 % (ref 11.5–15.5)
WBC: 5.9 10*3/uL (ref 4.0–10.5)

## 2021-10-26 LAB — HEPATIC FUNCTION PANEL
ALT: 18 U/L (ref 0–35)
AST: 17 U/L (ref 0–37)
Albumin: 4.3 g/dL (ref 3.5–5.2)
Alkaline Phosphatase: 76 U/L (ref 39–117)
Bilirubin, Direct: 0.1 mg/dL (ref 0.0–0.3)
Total Bilirubin: 0.6 mg/dL (ref 0.2–1.2)
Total Protein: 7.1 g/dL (ref 6.0–8.3)

## 2021-10-26 LAB — BASIC METABOLIC PANEL
BUN: 13 mg/dL (ref 6–23)
CO2: 26 mEq/L (ref 19–32)
Calcium: 9.2 mg/dL (ref 8.4–10.5)
Chloride: 103 mEq/L (ref 96–112)
Creatinine, Ser: 0.86 mg/dL (ref 0.40–1.20)
GFR: 65.38 mL/min (ref >60.00)
Glucose, Bld: 81 mg/dL (ref 70–99)
Potassium: 3.9 mEq/L (ref 3.5–5.1)
Sodium: 136 mEq/L (ref 135–145)

## 2021-10-26 LAB — TSH: TSH: 1.89 u[IU]/mL (ref 0.35–5.50)

## 2021-10-26 MED ORDER — LORATADINE 10 MG PO TABS: 10.0000 mg | ORAL_TABLET | Freq: Every day | ORAL | 2 refills | Status: AC | PRN

## 2021-10-26 MED ORDER — CLONAZEPAM 0.5 MG PO TABS: 0.5000 mg | ORAL_TABLET | Freq: Two times a day (BID) | ORAL | 1 refills | Status: DC | PRN

## 2021-10-26 NOTE — Patient Instructions (Signed)
We'll notify you of your lab results and determine if any changes need to be made and when we should follow up START the daily Claritin (Cetirizine) to decrease your drainage (phlegm) and improve cough We'll try and get the brand name Klonopin since you are having a bad response to the new manufacturer Drink LOTS of water to help prevent dizziness Change positions slowly to help prevent dizziness We'll call you with your Neuropsych referral to assess your memory The pepto can cause black stools Let me know if you change your mind about urology for the urinary symptoms Call with any questions or concerns Hang in there!!!

## 2021-10-26 NOTE — Assessment & Plan Note (Signed)
New.  Pt feels that this is again related to the change in her Clonazepam manufacturer.  Says the change in memory has been sudden and corresponds w/ medication change.  Will check labs to r/o metabolic cause.  Refer to Neuropsych for complete evaluation.

## 2021-10-26 NOTE — Progress Notes (Signed)
° °  Subjective:    [name], female    DOB: 01/14/1946, 76 y.o.   MRN: [id number]  HPI 'i feel like my body is falling apart'  Dizziness- pt notes that she develops dizziness when she takes both her BP medication and klonopin.  Legs feel 'rubbery'.  Develops pain in back of head that will travel around to the top and front.  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  She has been on Clonazepam for 35 yrs.  Pt states that the new manufacturer of her Clonazepam 'doesn't work for me'.  She wants her orange pills back  Bowel changes- bowels are dark, sometimes black.  Reports eating a lot of spinach.  States she has 'diarrhea every single morning'.  Takes pepto regularly.  Phlegm- 'it's a lot'.  Leads to coughing.  Not taking Claritin- 'it's too expensive'  Memory loss- 'it's just like my memory is leaving'  notes that things have been significantly worse since her Clonazepam manufacturer changed.   Review of Systems For ROS see HPI   This visit occurred during the SARS-CoV-2 public health emergency.  Safety protocols were in place, including screening questions prior to the visit, additional usage of staff PPE, and extensive cleaning of exam room while observing appropriate contact time as indicated for disinfecting solutions.      Objective:   Physical Exam Vitals reviewed.  Constitutional:      General: She is not in acute distress.    Appearance: Normal appearance. She is well-developed. She is not ill-appearing.  HENT:     Head: Normocephalic and atraumatic.     Nose: Congestion present.     Mouth/Throat:     Comments: Copious PND Eyes:     Conjunctiva/sclera: Conjunctivae normal.     Pupils: Pupils are equal, round, and reactive to light.  Neck:     Thyroid: No thyromegaly.  Cardiovascular:     Rate and Rhythm: Normal rate and regular rhythm.     Pulses: Normal pulses.     Heart sounds: Normal heart  sounds. No murmur heard. Pulmonary:     Effort: Pulmonary effort is normal. No respiratory distress.     Breath sounds: Normal breath sounds.  Abdominal:     General: There is no distension.     Palpations: Abdomen is soft.     Tenderness: There is no abdominal tenderness.  Musculoskeletal:     Cervical back: Normal range of motion and neck supple.  Lymphadenopathy:     Cervical: No cervical adenopathy.  Skin:    General: Skin is warm and dry.  Neurological:     Mental Status: She is alert and oriented to person, place, and time. Mental status is at baseline.     Gait: Gait abnormal (initially gait seemed wobbly, offbalance.  when she stepped out to use the restroom, her gait was normal).  Psychiatric:        Behavior: Behavior normal.     Comments: Anxious w/ catastrophic thinking.  Keeps saying she's 'dying'          Assessment & Plan:

## 2021-10-26 NOTE — Assessment & Plan Note (Signed)
Deteriorated.  Pt has had this issue for years.  Not currently taking daily antihistamine due to cost.  Will send prescription and encouraged her to take daily.

## 2021-10-26 NOTE — Assessment & Plan Note (Signed)
Pt reports that her bowels will be dark- almost black at time.  States she is eating quite a bit of spinach and taking Pepto regularly.  I explained that both of these can impact stool color.  She declines rectal exam today so no hemoccult done.  Will check CBC to assess for anemia and if present, will mail iFOB to pt to do at home.  Pt expressed understanding and is in agreement w/ plan.

## 2021-10-26 NOTE — Assessment & Plan Note (Signed)
Deteriorated.  Pt has struggled w/ this for years and has been resistant to taking any sort of daily controller medication.  We have tried in the past but she never takes the medication for more than a few days.  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  Will have withdrawal sxs if she stops medication.  She says that she was previously doing well until the manufacturer changed a few weeks ago.  Since then, she has dizziness, HA, feels unsteady.  'I know it's this medicine'.  Will attempt to write for brand name Klonopin to decrease adverse effects but I told her insurance may not pay.  Will follow.

## 2021-10-27 ENCOUNTER — Telehealth: Payer: Self-pay

## 2021-10-27 NOTE — Telephone Encounter (Signed)
Spoke to patient, she is aware of her lab results. Also asked for the [name] results, I read those to her as well as they were normal. Updated her that I was working on the PA for her klonopin. She understood

## 2021-10-27 NOTE — Telephone Encounter (Signed)
-----   Message from Midge Minium, MD sent at 10/27/2021  7:25 AM EST ----- Labs look great!  No changes at this time

## 2021-11-02 ENCOUNTER — Other Ambulatory Visit: Payer: Self-pay | Admitting: Registered Nurse

## 2021-11-02 DIAGNOSIS — H8110 Benign paroxysmal vertigo, unspecified ear: Secondary | ICD-10-CM

## 2021-11-04 ENCOUNTER — Telehealth: Payer: Self-pay | Admitting: Family Medicine

## 2021-11-04 NOTE — Telephone Encounter (Signed)
Where can pt have carbon monoxide testing? Is there such testing available?

## 2021-11-04 NOTE — Telephone Encounter (Signed)
Pt called in stating that she has been taking in carbon monoxide in her home from the heating and air unit. She wanted to know where to go to get tested to test her levels. Please advise   Pt can be reached at the home #

## 2021-11-05 ENCOUNTER — Other Ambulatory Visit: Payer: Self-pay

## 2021-11-05 ENCOUNTER — Ambulatory Visit
Admission: EM | Admit: 2021-11-05 | Discharge: 2021-11-05 | Disposition: A | Discharge status: Home / Self Care | Payer: Medicare HMO | Attending: Nurse Practitioner | Admitting: Nurse Practitioner

## 2021-11-05 ENCOUNTER — Encounter: Payer: Self-pay | Admitting: Emergency Medicine

## 2021-11-05 DIAGNOSIS — R519 Headache, unspecified: Secondary | ICD-10-CM | POA: Diagnosis not present

## 2021-11-05 DIAGNOSIS — Z7729 Contact with and (suspected ) exposure to other hazardous substances: Secondary | ICD-10-CM

## 2021-11-05 DIAGNOSIS — R42 Dizziness and giddiness: Secondary | ICD-10-CM

## 2021-11-05 NOTE — ED Triage Notes (Addendum)
Patient states when she's at home and the heat comes on she's been getting headaches, nauseous, confused. She would leave her house and feel better. At one point recently she felt so bad she almost passed out. Someone came out yesterday to examine her system and told her she had a crack in her unit and that she had carbon monoxide in her house. So she's been keeping her window cracked and put the air purifier on. Yesterday there were a couple times she almost passed out.  Was last in her house this morning. The only symptom she's having right now is lightheadedness. Denies headache, nausea at the moment. A&O x 4.

## 2021-11-05 NOTE — Telephone Encounter (Signed)
Pt had testing done at urgent care, and they will follow up about results

## 2021-11-05 NOTE — Telephone Encounter (Signed)
She can either buy a carbon monoxide detector (like a smoke alarm) for her home or she can call the fire department if she has concerns about exposure

## 2021-11-05 NOTE — ED Provider Notes (Signed)
[name]    CSN: [id number] Arrival date & time: 11/05/21  7035      History   Chief Complaint No chief complaint on file.   HPI Margaret Torres is a 76 y.o. female.   Subjective:  Margaret Torres is a 76 y.o. female who is here for evaluation of dizziness, headache, nausea and confusion. She describes the symptoms as lightheadedness.  She denies any fevers, chills, body aches, vomiting, blurred vision, chest pain, shortness of breath, palpitations or paresthesias of the face/extremities.  Symptoms are exacerbated by possible carbon monoxide exposure within her home.  Patient reports that she felt like something has been going on with her heat unit for "quite a while."  She reports that she begins to experience the above symptoms when her heat unit is on.  Whenever she is outside the home, she is totally asymptomatic.  Current symptoms have been ongoing over the past month or so.  She also recalls having similar symptoms last winter and reports that she did not have any symptoms during the warmer months.  Patient feels like her symptoms is due to a gas issue although she did not smell any fumes.  She called [employer] on 11/03/2021. They sent someone out to her home immediately. Patient reports that a very small leak was noted in the gas valve but was told that it was not large enough to cause any symptoms; however, they advised that she called her heat unit company for further evaluation.  As a precaution, [employer] turned off her gas. The heat unit company, [employer], came out about 45 minutes after she made the call on 11/03/2021.  No gas leak was noted by the technician; however, he did not have the correct equipment to check for carbon monoxide. A second serviceman came out to her home on 11/04/2021 and completed an extensive evaluation. There was a crack noted in her heat exchange which was causing carbon monoxide to leak through the vents of her home.  Patient was advised to leave the home immediately and also to be checked out for carbon monoxide poisoning. The patient has not been back to her home since and has been staying with her daughter. Currently, the patient denies any symptoms and is here today to be checked for CO2.    The following portions of the patient's history were reviewed and updated as appropriate: allergies, current medications, past family history, past medical history, past social history, past surgical history, and problem list.     Past Medical History:  Diagnosis Date   Anxiety    Cervical cancer ([place])    Depression    Hypertension     Patient Active Problem List   Diagnosis Date Noted   Memory loss 10/26/2021   Abnormal feces 11/28/2020   First degree hemorrhoids 11/28/2020   Generalized abdominal pain 11/28/2020   Second degree hemorrhoids 11/28/2020   Hemorrhage of rectum and anus 11/28/2020   Wheezing 11/17/2020   Primary osteoarthritis of both knees 10/20/2018   GERD (gastroesophageal reflux disease) 10/12/2018   Multiple falls 10/12/2018   Multiple thyroid nodules 12/30/2017   Diarrhea 09/20/2016   Hyperthyroidism 04/21/2016   Bowel incontinence 04/21/2016   Leg pain, bilateral 03/02/2016   Dysphagia 03/02/2016   Olecranon bursitis of left elbow 01/30/2016   Physical exam 01/27/2015   Bilateral renal cysts 01/27/2015   Left anterior knee pain 09/30/2014   Insomnia 06/20/2014   Hemorrhoids, external 03/27/2014   Anxiety and depression  07/16/2007   Essential hypertension 07/16/2007   Allergic rhinitis 07/16/2007   Hyperlipidemia 06/26/2007   TAH/BSO, HX OF 06/26/2007    Past Surgical History:  Procedure Laterality Date   ABDOMINAL HYSTERECTOMY     WISDOM TOOTH EXTRACTION Bilateral     OB History   No obstetric history on file.      Home Medications    Prior to Admission medications   Medication Sig Start Date End Date Taking? Authorizing Provider  amLODipine (NORVASC) 10 MG  tablet Take 1 tablet by mouth once daily 09/21/21   Midge Minium, MD  clonazePAM (KLONOPIN) 0.5 MG tablet Take 1 tablet (0.5 mg total) by mouth 2 (two) times daily as needed. 10/26/21   Midge Minium, MD  fluticasone Asencion Islam) 50 MCG/ACT nasal spray Use 2 spray(s) in each nostril once daily 08/07/21   Midge Minium, MD  loratadine (CLARITIN) 10 MG tablet Take 1 tablet (10 mg total) by mouth daily as needed for allergies. Take 10 mg by mouth daily as needed for allergies. 10/26/21   Midge Minium, MD  meclizine (ANTIVERT) 25 MG tablet TAKE 1 TABLET BY MOUTH THREE TIMES DAILY AS NEEDED FOR DIZZINESS 11/02/21   Maximiano Coss, NP    Family History Family History  Problem Relation Age of Onset   Cervical cancer Mother    Hypertension Mother    Hypertension Father    Heart disease Brother    Diabetes Brother    Stroke Maternal Grandmother    Cancer Other    Diabetes Sister    Stroke Sister    Cancer Sister        bone cancer   Breast cancer Sister    Heart attack Son    Thyroid disease Neg Hx     Social History Social History   Tobacco Use   Smoking status: Never   Smokeless tobacco: Never  Substance Use Topics   Alcohol use: No   Drug use: No     Allergies   Influenza vaccines   Review of Systems Review of Systems  Constitutional:  Positive for fatigue. Negative for fever.  HENT:  Negative for sore throat.   Respiratory:  Negative for cough, chest tightness, shortness of breath and wheezing.   Cardiovascular:  Negative for chest pain and palpitations.  Gastrointestinal:  Positive for nausea. Negative for vomiting.  Neurological:  Positive for dizziness and headaches. Negative for tremors, seizures, syncope, speech difficulty, weakness and numbness.  All other systems reviewed and are negative.   Physical Exam Triage Vital Signs ED Triage Vitals  Enc Vitals Group     BP 11/05/21 0833 (!) 163/89     Pulse Rate 11/05/21 0833 86     Resp  11/05/21 0833 16     Temp 11/05/21 0833 98.6 F (37 C)     Temp Source 11/05/21 0833 Oral     SpO2 11/05/21 0833 96 %     Weight --      Height --      Head Circumference --      Peak Flow --      Pain Score 11/05/21 0839 0     Pain Loc --      Pain Edu? --      Excl. in [place]? --    No data found.  Updated Vital Signs BP (!) 163/89 (BP Location: Left Arm)    Pulse 86    Temp 98.6 F (37 C) (Oral)    Resp 16  LMP 06/30/2013    SpO2 96%   Visual Acuity Right Eye Distance:   Left Eye Distance:   Bilateral Distance:    Right Eye Near:   Left Eye Near:    Bilateral Near:     Physical Exam   UC Treatments / Results  Labs (all labs ordered are listed, but only abnormal results are displayed) Labs Reviewed  [place] PANEL    EKG   Radiology No results found.  Procedures Procedures (including critical care time)  Medications Ordered in UC Medications - No data to display  Initial Impression / Assessment and Plan / UC Course  I have reviewed the triage vital signs and the nursing notes.  Pertinent labs & imaging results that were available during my care of the patient were reviewed by me and considered in my medical decision making (see chart for details).   76 year old female presenting with dizziness, headache, nausea and confusion due to crack in the heat exchange of her home causing a carbon monoxide leak.  Patient is currently asymptomatic and has been staying with her daughter.  Patient is alert and oriented x3.  Nontoxic-appearing.  SPO2 96% on room air.  No focal neurologic findings noted.  BMP and carboxyhemoglobin drawn.  Results pending.  Patient advised to continue staying with her daughter until her house is safe to live in.  Patient advised to put CO2 detectors in every room in her home when she moves back in.  Discussed indications for immediate ED evaluation.  Today's evaluation has revealed no signs of a dangerous  process. Discussed diagnosis with patient and/or guardian. Patient and/or guardian aware of their diagnosis, possible red flag symptoms to watch out for and need for close follow up. Patient and/or guardian understands verbal and written discharge instructions. Patient and/or guardian comfortable with plan and disposition.  Patient and/or guardian has a clear mental status at this time, good insight into illness (after discussion and teaching) and has clear judgment to make decisions regarding their care  This care was provided during an unprecedented National Emergency due to the Novel Coronavirus (COVID-19) pandemic. COVID-19 infections and transmission risks place heavy strains on healthcare resources.  As this pandemic evolves, our facility, providers, and staff strive to respond fluidly, to remain operational, and to provide care relative to available resources and information. Outcomes are unpredictable and treatments are without well-defined guidelines. Further, the impact of COVID-19 on all aspects of urgent care, including the impact to patients seeking care for reasons other than COVID-19, is unavoidable during this national emergency. At this time of the global pandemic, management of patients has significantly changed, even for non-COVID positive patients given high local and regional COVID volumes at this time requiring high healthcare system and resource utilization. The standard of care for management of both COVID suspected and non-COVID suspected patients continues to change rapidly at the local, regional, national, and global levels. This patient was worked up and treated to the best available but ever changing evidence and resources available at this current time.   Documentation was completed with the aid of voice recognition software. Transcription may contain typographical errors. Final Clinical Impressions(s) / UC Diagnoses   Final diagnoses:  Acute nonintractable headache, unspecified  headache type  Carbon monoxide exposure  Dizziness     Discharge Instructions      You have been evaluated for carbon monoxide exposure.  We are performing a carboxyhemoglobin blood test to check for carbon monoxide poisoning. Test  results should be available in the next couple of days. You may go onto MyChart to view the results, call the urgent care or call your primary care provider to obtain the results.  Please continue to stay with your daughter until [place] and [employer] has deemed your house to live in  Make sure that you have carbon monoxide detectors placed within your home before you move back in  Go to the ED immediately if you began to experience symptoms or have any other concerns      ED Prescriptions   None    PDMP not reviewed this encounter.   Enrique Sack, [place] 11/05/21 1004

## 2021-11-05 NOTE — Discharge Instructions (Addendum)
You have been evaluated for carbon monoxide exposure.  We are performing a carboxyhemoglobin blood test to check for carbon monoxide poisoning. Test results should be available in the next couple of days. You may go onto MyChart to view the results, call the urgent care or call your primary care provider to obtain the results.  Please continue to stay with your daughter until [place] and [employer] has deemed your house to live in  Make sure that you have carbon monoxide detectors placed within your home before you move back in  Go to the ED immediately if you began to experience symptoms or have any other concerns

## 2021-11-06 LAB — BASIC METABOLIC PANEL
BUN/Creatinine Ratio: 13 (ref 12–28)
BUN: 13 mg/dL (ref 8–27)
CO2: 22 mmol/L (ref 20–29)
Calcium: 9.4 mg/dL (ref 8.7–10.3)
Chloride: 105 mmol/L (ref 96–106)
Creatinine, Ser: 1.01 mg/dL — ABNORMAL HIGH (ref 0.57–1.00)
Glucose: 95 mg/dL (ref 70–99)
Potassium: 3.8 mmol/L (ref 3.5–5.2)
Sodium: 141 mmol/L (ref 134–144)
eGFR: 58 mL/min/{1.73_m2} — ABNORMAL LOW (ref >59)

## 2021-11-10 LAB — SPECIMEN STATUS REPORT

## 2021-11-10 LAB — CARBON MONOXIDE, BLOOD (PERFORMED AT REF LAB): CARBON MONOXIDE, BLOOD: 1.4 % (ref 0.0–3.6)

## 2021-11-16 ENCOUNTER — Telehealth: Payer: Self-pay

## 2021-11-16 NOTE — Telephone Encounter (Signed)
Error

## 2021-11-18 ENCOUNTER — Telehealth: Payer: Self-pay | Admitting: Family Medicine

## 2021-11-18 DIAGNOSIS — F32A Depression, unspecified: Secondary | ICD-10-CM

## 2021-11-18 DIAGNOSIS — F419 Anxiety disorder, unspecified: Secondary | ICD-10-CM

## 2021-11-18 NOTE — Telephone Encounter (Signed)
Pt would like to talk to you about medications.

## 2021-11-18 NOTE — Telephone Encounter (Signed)
Patient called with concerns of her Clonazepam causing her to feel "palpitations."  She reports sometimes she will wake up in the night and feel like she needs to take "two or three more."  We discussed other alternatives because she even tried brand Klonopin and this was happening.  Would you consider putting her on something like Lexapro for her anxiety/depression to take on a daily basis to hopefully prevent her current symptoms?

## 2021-11-19 MED ORDER — ESCITALOPRAM OXALATE 10 MG PO TABS: 10.0000 mg | ORAL_TABLET | Freq: Every day | ORAL | 3 refills | Status: DC

## 2021-11-19 NOTE — Telephone Encounter (Signed)
Absolutely.

## 2021-11-19 NOTE — Telephone Encounter (Signed)
I am 100% in favor of treating her underlying anxiety w/ a daily medication but she has not been open to this in the past.  I have sent in multiple medications (including Lexapro) and she never takes them long enough before stopping and saying they don't work.  Maybe hearing it from you will make a difference but this has been an ongoing battle for years.  I think a lot of her physical/somatic symptoms would also improve w/ appropriate treatment of her anxiety and we have tried to talk about this as well.  But I haven't been able to make any headway on this issue.  I would love for you to try!

## 2021-11-19 NOTE — Telephone Encounter (Signed)
Ok.  Would you be ok with trying Lexapro 10mg  daily if she agrees?

## 2021-11-19 NOTE — Addendum Note (Signed)
Addended by: Edythe Clarity on: 11/19/2021 02:13 PM   Modules accepted: Orders

## 2021-11-19 NOTE — Chronic Care Management (AMB) (Signed)
error 

## 2021-11-19 NOTE — Chronic Care Management (AMB) (Signed)
See below correspondence with PCP.  New Rx for Lexapro 10mg  daily called in to [facility].  Patient notified and discussed to take for 3-4 weeks for full benefit.  Beverly Milch, PharmD Clinical Pharmacist  [facility] [phone]

## 2021-12-28 ENCOUNTER — Other Ambulatory Visit: Payer: Self-pay | Admitting: Family Medicine

## 2021-12-28 DIAGNOSIS — J309 Allergic rhinitis, unspecified: Secondary | ICD-10-CM

## 2022-01-11 ENCOUNTER — Telehealth: Payer: Self-pay

## 2022-01-11 DIAGNOSIS — F419 Anxiety disorder, unspecified: Secondary | ICD-10-CM

## 2022-01-11 MED ORDER — CLONAZEPAM 0.5 MG PO TABS: 0.5000 mg | ORAL_TABLET | Freq: Two times a day (BID) | ORAL | 1 refills | Status: DC | PRN

## 2022-01-12 NOTE — Telephone Encounter (Signed)
Lvm for patient letting her know that I had attempted to do the PA and insurance is not going to approve it since it is available without a PA in generic form. Asked that she call me back with any questions ?

## 2022-01-12 NOTE — Telephone Encounter (Signed)
Patient is calling in stating that the name brand of the medication will not be covered and pharmacy is telling her that there needs to be a PA submitted. Patient would like clarification on this if possible.  ?

## 2022-01-12 NOTE — Telephone Encounter (Signed)
Patient called back very confused about her medication and stated that she couldn't afford Klonopin and needed clonazepam. I informed her the pharmacy was only going off of the pharmacy note about the generic making her sick as she stated at her last visit. Patient denies that it makes her sick now.  ? ?I called pharmacy and spoke with them to let them know to disregard the pharmacy note and please give patient clonazepam ?

## 2022-01-13 NOTE — Telephone Encounter (Signed)
Noted  

## 2022-02-04 ENCOUNTER — Ambulatory Visit (INDEPENDENT_AMBULATORY_CARE_PROVIDER_SITE_OTHER): Payer: Medicare HMO | Admitting: Family Medicine

## 2022-02-04 ENCOUNTER — Encounter: Payer: Self-pay | Admitting: Family Medicine

## 2022-02-04 VITALS — BP 140/90 | HR 62 | Temp 97.2°F | Resp 16 | Ht 64.0 in | Wt 154.0 lb

## 2022-02-04 DIAGNOSIS — R351 Nocturia: Secondary | ICD-10-CM | POA: Diagnosis not present

## 2022-02-04 DIAGNOSIS — R5383 Other fatigue: Secondary | ICD-10-CM | POA: Diagnosis not present

## 2022-02-04 DIAGNOSIS — R058 Other specified cough: Secondary | ICD-10-CM | POA: Diagnosis not present

## 2022-02-04 DIAGNOSIS — R062 Wheezing: Secondary | ICD-10-CM

## 2022-02-04 LAB — BASIC METABOLIC PANEL
BUN: 17 mg/dL (ref 6–23)
CO2: 26 mEq/L (ref 19–32)
Calcium: 9.1 mg/dL (ref 8.4–10.5)
Chloride: 106 mEq/L (ref 96–112)
Creatinine, Ser: 0.91 mg/dL (ref 0.40–1.20)
GFR: 60.97 mL/min (ref >60.00)
Glucose, Bld: 83 mg/dL (ref 70–99)
Potassium: 4.3 mEq/L (ref 3.5–5.1)
Sodium: 139 mEq/L (ref 135–145)

## 2022-02-04 LAB — HEPATIC FUNCTION PANEL
ALT: 19 U/L (ref 0–35)
AST: 19 U/L (ref 0–37)
Albumin: 4.4 g/dL (ref 3.5–5.2)
Alkaline Phosphatase: 77 U/L (ref 39–117)
Bilirubin, Direct: 0.1 mg/dL (ref 0.0–0.3)
Total Bilirubin: 0.5 mg/dL (ref 0.2–1.2)
Total Protein: 7 g/dL (ref 6.0–8.3)

## 2022-02-04 LAB — CBC WITH DIFFERENTIAL/PLATELET
Basophils Absolute: 0 10*3/uL (ref 0.0–0.1)
Basophils Relative: 0.8 % (ref 0.0–3.0)
Eosinophils Absolute: 0.2 10*3/uL (ref 0.0–0.7)
Eosinophils Relative: 3.8 % (ref 0.0–5.0)
HCT: 35.7 % — ABNORMAL LOW (ref 36.0–46.0)
Hemoglobin: 11.9 g/dL — ABNORMAL LOW (ref 12.0–15.0)
Lymphocytes Relative: 32.3 % (ref 12.0–46.0)
Lymphs Abs: 2.1 10*3/uL (ref 0.7–4.0)
MCHC: 33.3 g/dL (ref 30.0–36.0)
MCV: 91.3 fl (ref 78.0–100.0)
Monocytes Absolute: 0.4 10*3/uL (ref 0.1–1.0)
Monocytes Relative: 6.9 % (ref 3.0–12.0)
Neutro Abs: 3.6 10*3/uL (ref 1.4–7.7)
Neutrophils Relative %: 56.2 % (ref 43.0–77.0)
Platelets: 284 10*3/uL (ref 150.0–400.0)
RBC: 3.91 Mil/uL (ref 3.87–5.11)
RDW: 13.7 % (ref 11.5–15.5)
WBC: 6.5 10*3/uL (ref 4.0–10.5)

## 2022-02-04 LAB — VITAMIN D 25 HYDROXY (VIT D DEFICIENCY, FRACTURES): VITD: 14.44 ng/mL — ABNORMAL LOW (ref 30.00–100.00)

## 2022-02-04 LAB — B12 AND FOLATE PANEL
Folate: 14.6 ng/mL (ref >5.9)
Vitamin B-12: 429 pg/mL (ref 211–911)

## 2022-02-04 LAB — TSH: TSH: 1.8 u[IU]/mL (ref 0.35–5.50)

## 2022-02-04 MED ORDER — QUETIAPINE FUMARATE 25 MG PO TABS: 12.5000 mg | ORAL_TABLET | Freq: Every day | ORAL | 1 refills | Status: DC

## 2022-02-04 NOTE — Progress Notes (Signed)
? ?Subjective:  ? ? [name], female    DOB: 1946-03-10, 76 y.o.   MRN: [id number] ? ?HPI ?Nocturnal cough- pt states cough starts around 7pm nightly.  Has air purifier and humidifier. Taking Claritin and Flonase.  Pt reports cough is productive.  Copious PND.  Pt denies reflux.  This is ongoing problem for her ? ?Fatigue- 'i just feel so tired'.  She states she doesn't have any energy or motivation to do her work around the house.  Pt reports increased appetite.  Pt reports early morning awakening.  Pt reports sleeping well when she takes Quetiapine but this makes her groggy in the morning ? ?Nocturia- pt reports she is able to control her urine during the day but at night has to go 4-5x.  Reports drinking water all day and throughout the night.  Reports having to wear pads at night ? ? ?Review of Systems ?For ROS see HPI  ?   ?Objective:  ? Physical Exam ?Vitals reviewed.  ?Constitutional:   ?   General: She is not in acute distress. ?   Appearance: Normal appearance. She is well-developed. She is not ill-appearing.  ?HENT:  ?   Head: Normocephalic and atraumatic.  ?   Right Ear: Tympanic membrane normal.  ?   Left Ear: Tympanic membrane normal.  ?   Nose: Mucosal edema and rhinorrhea present.  ?   Right Sinus: No maxillary sinus tenderness or frontal sinus tenderness.  ?   Left Sinus: No maxillary sinus tenderness or frontal sinus tenderness.  ?   Mouth/Throat:  ?   Pharynx: Posterior oropharyngeal erythema (w/ PND) present.  ?Eyes:  ?   Conjunctiva/sclera: Conjunctivae normal.  ?   Pupils: Pupils are equal, round, and reactive to light.  ?Cardiovascular:  ?   Rate and Rhythm: Normal rate and regular rhythm.  ?   Pulses: Normal pulses.  ?   Heart sounds: Normal heart sounds.  ?Pulmonary:  ?   Effort: Pulmonary effort is normal. No respiratory distress.  ?   Breath sounds: Normal breath sounds. No wheezing or rales.  ?Musculoskeletal:  ?   Cervical back: Normal range of motion and neck supple.  ?    Right lower leg: No edema.  ?   Left lower leg: No edema.  ?Lymphadenopathy:  ?   Cervical: No cervical adenopathy.  ?Skin: ?   General: Skin is warm and dry.  ?Neurological:  ?   General: No focal deficit present.  ?   Mental Status: She is alert and oriented to person, place, and time.  ?Psychiatric:     ?   Mood and Affect: Mood normal.     ?   Behavior: Behavior normal.     ?   Thought Content: Thought content normal.  ? ? ? ? ? ?   ?Assessment & Plan:  ? ?Fatigue- recurrent problem.  She has had similar sxs in the past that were related to depression but will check labs to r/o underlying metabolic cause.  Also encouraged her to stop drinking water after 7 so she won't have to get up at night as much to urinate.  She states she sleeps 'great' on Quetiapine but it leaves her groggy in the morning.  Will decrease the dose to 12.'5mg'$  QHS and see if this helps.  Pt expressed understanding and is in agreement w/ plan.  ? ?Nocturnal cough w/ wheeze- ongoing issue for pt.  Currently on daily antihistamine and nasal steroid (Flonase).  Pt has copious PND on exam and I suspect this is the cause of her sxs.  Did discuss the possibility of silent reflux since this is occurring at night but she states this is not the cause.  Continue treatment of seasonal allergies and supportive care. ? ?Nocturia- ongoing issue for pt.  She is not interested in returning to Urology b/c she states she owes them money.  We have discussed this multiple times.  Encouraged her to stop fluids after 7pm to avoid needing to urinate as much over  ?

## 2022-02-04 NOTE — Patient Instructions (Addendum)
Follow up as needed or as scheduled ?We'll notify you of your lab results and make any changes if needed ?Drink water throughout the day but stop before 7pm so you don't have to pee at night ?Make sure you are taking your allergy medicine and nasal spray ?Restart the Quetiapine at 1/2 tab nightly ?Call with any questions or concerns ?Hang in there!!! ?

## 2022-02-05 ENCOUNTER — Telehealth: Payer: Self-pay

## 2022-02-05 NOTE — Telephone Encounter (Signed)
-----   Message from Midge Minium, MD sent at 02/05/2022  7:27 AM EDT ----- ?Labs look great w/ exception of low Vit D.  This can be contributing to your fatigue.  Based on this, we need to start prescription 50,000 units weekly x12 weeks in addition to daily OTC supplement of at least 2000 units.  ?

## 2022-02-08 MED ORDER — VITAMIN D (ERGOCALCIFEROL) 1.25 MG (50000 UNIT) PO CAPS: 50000.0000 [IU] | ORAL_CAPSULE | ORAL | 0 refills | Status: DC

## 2022-02-08 NOTE — Telephone Encounter (Signed)
Called to discuss with patient, she notes the pharmacist discussed with her and she decided to purchase the vitamins as they were $12 will follow up.  ?

## 2022-02-08 NOTE — Telephone Encounter (Signed)
Patient is returning a call about lab results.  

## 2022-02-08 NOTE — Telephone Encounter (Signed)
Called pt back and informed of lab results voiced understanding did order Vit D and she will pick up today.  ?

## 2022-02-08 NOTE — Telephone Encounter (Signed)
Patient is calling in stating that the vitamin D was too expensive. Wanting to know if there is something over the counter that can be sent in.  ?

## 2022-02-08 NOTE — Telephone Encounter (Signed)
She can take OTC Vit D 5,000 units daily rather than the once weekly prescription supplement ?

## 2022-02-10 DIAGNOSIS — Z0100 Encounter for examination of eyes and vision without abnormal findings: Secondary | ICD-10-CM | POA: Diagnosis not present

## 2022-02-10 DIAGNOSIS — H2513 Age-related nuclear cataract, bilateral: Secondary | ICD-10-CM | POA: Diagnosis not present

## 2022-03-19 ENCOUNTER — Encounter: Payer: Self-pay | Admitting: Family Medicine

## 2022-03-19 ENCOUNTER — Ambulatory Visit (INDEPENDENT_AMBULATORY_CARE_PROVIDER_SITE_OTHER): Payer: Medicare HMO | Admitting: Family Medicine

## 2022-03-19 VITALS — BP 126/80 | HR 79 | Temp 98.1°F | Resp 16 | Ht 64.0 in | Wt 151.4 lb

## 2022-03-19 DIAGNOSIS — E559 Vitamin D deficiency, unspecified: Secondary | ICD-10-CM

## 2022-03-19 DIAGNOSIS — I10 Essential (primary) hypertension: Secondary | ICD-10-CM | POA: Diagnosis not present

## 2022-03-19 DIAGNOSIS — K219 Gastro-esophageal reflux disease without esophagitis: Secondary | ICD-10-CM

## 2022-03-19 HISTORY — DX: Vitamin D deficiency, unspecified: E55.9

## 2022-03-19 MED ORDER — SUCRALFATE 1 G PO TABS: 1.0000 g | ORAL_TABLET | Freq: Three times a day (TID) | ORAL | 0 refills | Status: DC

## 2022-03-19 MED ORDER — PANTOPRAZOLE SODIUM 40 MG PO TBEC: 40.0000 mg | DELAYED_RELEASE_TABLET | Freq: Every day | ORAL | 3 refills | Status: DC

## 2022-03-19 MED ORDER — AMLODIPINE BESYLATE 5 MG PO TABS: 5.0000 mg | ORAL_TABLET | Freq: Every day | ORAL | 1 refills | Status: DC

## 2022-03-19 NOTE — Progress Notes (Signed)
   Subjective:    [name], female    DOB: 29-Aug-1946, 76 y.o.   MRN: [id number]  HPI Abd pain- 'i feel like my time is up'.  Pt describes a 'burning pain' in her stomach.  She reports she will take ibuprofen or motrin but 'it doesn't help'.  Pt reports pain improves w/ eating.  Pain is worse when stomach is empty.  Certain foods worsen pain  HTN- chronic problem, well controlled on Amlodipine.  Notes that BP will drop when she takes her Clonazepam.  If BP is in the teens she will not take her Amlodipine.  Vit D deficiency- pt is not able to take the prescription strength Vit D due to dizziness.     Review of Systems For ROS see HPI     Objective:   Physical Exam Vitals reviewed.  Constitutional:      General: She is not in acute distress.    Appearance: She is well-developed. She is not ill-appearing.  HENT:     Head: Normocephalic and atraumatic.  Eyes:     Conjunctiva/sclera: Conjunctivae normal.     Pupils: Pupils are equal, round, and reactive to light.  Neck:     Thyroid: No thyromegaly.  Cardiovascular:     Rate and Rhythm: Normal rate and regular rhythm.     Heart sounds: Normal heart sounds. No murmur heard. Pulmonary:     Effort: Pulmonary effort is normal. No respiratory distress.     Breath sounds: Normal breath sounds.  Abdominal:     General: There is no distension.     Palpations: Abdomen is soft. There is no fluid wave or hepatomegaly.     Tenderness: There is no abdominal tenderness.  Musculoskeletal:     Cervical back: Normal range of motion and neck supple.  Lymphadenopathy:     Cervical: No cervical adenopathy.  Skin:    General: Skin is warm and dry.  Neurological:     Mental Status: She is alert and oriented to person, place, and time.  Psychiatric:        Behavior: Behavior normal.           Assessment & Plan:

## 2022-03-19 NOTE — Patient Instructions (Signed)
Follow up as needed or as scheduled START the Pantoprazole once daily to decrease the acid production TAKE the Sucralfate before each meal to help prevent stomach pain DECREASE the Amlodipine to '5mg'$  daily HOLD the Amlodipine if the blood pressure is less than 120 ADD a once daily Vit D supplement of 2,000units DO NOT take motrin or Advil for stomach pain- this can make it worse If your stomach hurts you can add Tums If no improvement in the stomach pain in the next 2-3 weeks- let me know! Call with any questions or concerns Hang in there!!!

## 2022-03-21 NOTE — Assessment & Plan Note (Signed)
Deteriorated.  Pt's described abdominal pain is consistent w/ GERD/gastritis/PUD.  Will start daily Pantoprazole and Carafate at each meal.  Reviewed dietary and lifestyle modifications that will improve sxs.  Pt expressed understanding and is in agreement w/ plan.

## 2022-03-21 NOTE — Assessment & Plan Note (Signed)
Pt reports prescription strength Vit D caused dizziness.  Will hold off on high dose Vit D and continue w/ daily OTC supplements.  Pt expressed understanding and is in agreement w/ plan.

## 2022-03-21 NOTE — Assessment & Plan Note (Signed)
Chronic problem.  Pt reports BP will drop if she takes her Clonazepam.  I told her this was to be expected but it scares her and she reports feeling poorly when it occurs.  Will decrease Amlodipine to '5mg'$  daily and she was instructed to hold BP meds entirely for BP <120.  Pt expressed understanding and is in agreement w/ plan.

## 2022-03-25 ENCOUNTER — Ambulatory Visit (INDEPENDENT_AMBULATORY_CARE_PROVIDER_SITE_OTHER): Payer: Medicare HMO

## 2022-03-25 DIAGNOSIS — Z0000 Encounter for general adult medical examination without abnormal findings: Secondary | ICD-10-CM | POA: Diagnosis not present

## 2022-03-25 NOTE — Progress Notes (Signed)
Subjective:   Margaret Torres is a 76 y.o. female who presents for Medicare Annual (Subsequent) preventive examination.   I connected with [name]  today by telephone and verified that I am speaking with the correct person using two identifiers. Location patient: home Location provider: work Persons participating in the virtual visit: patient, provider.   I discussed the limitations, risks, security and privacy concerns of performing an evaluation and management service by telephone and the availability of in person appointments. I also discussed with the patient that there may be a patient responsible charge related to this service. The patient expressed understanding and verbally consented to this telephonic visit.    Interactive audio and video telecommunications were attempted between this provider and patient, however failed, due to patient having technical difficulties OR patient did not have access to video capability.  We continued and completed visit with audio only.    Review of Systems     Cardiac Risk Factors include: advanced age (>65mn, >>44women)     Objective:    Today's Vitals   There is no height or weight on file to calculate BMI.     03/25/2022   11:18 AM 02/23/2021    9:02 AM 02/20/2020    1:22 PM 05/13/2016    8:02 AM 03/01/2015    6:23 AM  Advanced Directives  Does Patient Have a Medical Advance Directive? No No Yes No No  Type of AScientist, physiologicalof ABreaLiving will    Does patient want to make changes to medical advance directive?   No - Patient declined    Copy of H[place]annondalein Chart?   No - copy requested    Would patient like information on creating a medical advance directive? No - Patient declined Yes (MAU/Ambulatory/Procedural Areas - Information given)  No - patient declined information No - patient declined information    Current Medications (verified) Outpatient Encounter Medications as of  03/25/2022  Medication Sig   amLODipine (NORVASC) 5 MG tablet Take 1 tablet (5 mg total) by mouth daily.   clonazePAM (KLONOPIN) 0.5 MG tablet Take 1 tablet (0.5 mg total) by mouth 2 (two) times daily as needed.   escitalopram (LEXAPRO) 10 MG tablet Take 1 tablet (10 mg total) by mouth daily.   fluticasone (FLONASE) 50 MCG/ACT nasal spray Use 2 spray(s) in each nostril once daily   loratadine (CLARITIN) 10 MG tablet Take 1 tablet (10 mg total) by mouth daily as needed for allergies. Take 10 mg by mouth daily as needed for allergies.   meclizine (ANTIVERT) 25 MG tablet TAKE 1 TABLET BY MOUTH THREE TIMES DAILY AS NEEDED FOR DIZZINESS   pantoprazole (PROTONIX) 40 MG tablet Take 1 tablet (40 mg total) by mouth daily.   QUEtiapine (SEROQUEL) 25 MG tablet Take 0.5 tablets (12.5 mg total) by mouth at bedtime.   sucralfate (CARAFATE) 1 g tablet Take 1 tablet (1 g total) by mouth 3 (three) times daily with meals.   Vitamin D, Ergocalciferol, (DRISDOL) 1.25 MG (50000 UNIT) CAPS capsule Take 1 capsule (50,000 Units total) by mouth every 7 (seven) days.   No facility-administered encounter medications on file as of 03/25/2022.    Allergies (verified) Influenza vaccines   History: Past Medical History:  Diagnosis Date   Anxiety    [place]    Depression    Hypertension    Past Surgical History:  Procedure Laterality Date   ABDOMINAL HYSTERECTOMY  WISDOM TOOTH EXTRACTION Bilateral    Family History  Problem Relation Age of Onset   Cervical cancer Mother    Hypertension Mother    Hypertension Father    Heart disease Brother    Diabetes Brother    Stroke Maternal Grandmother    Cancer Other    Diabetes Sister    Stroke Sister    Cancer Sister        bone cancer   Breast cancer Sister    Heart attack Son    Thyroid disease Neg Hx    Social History   Socioeconomic History   Marital status: Divorced    Spouse name: Not on file   Number of children: 3   Years of  education: Not on file   Highest education level: Not on file  Occupational History   Occupation: Retired    Occupation: retired - sales/marketing  Tobacco Use   Smoking status: Never   Smokeless tobacco: Never  Substance and Sexual Activity   Alcohol use: No   Drug use: No   Sexual activity: Not Currently    Birth control/protection: Post-menopausal  Other Topics Concern   Not on file  Social History Narrative   Hobbies: enjoys fishing    1 Daughter that lives locally    1 Son lives in [place]    3 [place]    1 Son deceased    Social Determinants of Health   Financial Resource Strain: [employer]  (03/25/2022)   Overall Financial Resource Strain (CARDIA)    Difficulty of Paying Living Expenses: Not hard at all  Food Insecurity: No Food Insecurity (03/25/2022)   Hunger Vital Sign    Worried About Running Out of Food in the Last Year: Never true    [place] in the Last Year: Never true  Transportation Needs: No Transportation Needs (03/25/2022)   PRAPARE - Hydrologist (Medical): No    Lack of Transportation (Non-Medical): No  Physical Activity: Insufficiently Active (03/25/2022)   Exercise Vital Sign    Days of Exercise per Week: 3 days    Minutes of Exercise per Session: 30 min  Stress: No Stress Concern Present (03/25/2022)   Bent    Feeling of Stress : Not at all  Social Connections: Socially Isolated (03/25/2022)   Social Connection and Isolation Panel [NHANES]    Frequency of Communication with Friends and Family: Three times a week    Frequency of Social Gatherings with Friends and Family: Three times a week    Attends Religious Services: Never    Active Member of Clubs or Organizations: No    Attends Music therapist: Never    Marital Status: Divorced    Tobacco Counseling Counseling given: Not Answered   Clinical Intake:  Pre-visit  preparation completed: Yes  Pain : No/denies pain     Nutritional Risks: None Diabetes: No  How often do you need to have someone help you when you read instructions, pamphlets, or other written materials from your doctor or pharmacy?: 1 - Never What is the last grade level you completed in school?: college  Diabetic?no   Interpreter Needed?: No  Information entered by :: [place] of Daily Living    03/25/2022   11:24 AM 04/21/2021   10:31 AM  In your present state of health, do you have any difficulty performing the following activities:  Hearing? 0 0  Vision? 0 1  Difficulty concentrating or making decisions? 0 0  Walking or climbing stairs? 0 0  Dressing or bathing? 0 0  Doing errands, shopping? 0 0  Preparing Food and eating ? N   Using the Toilet? N   In the past six months, have you accidently leaked urine? N   Do you have problems with loss of bowel control? N   Managing your Medications? N   Managing your Finances? N   Housekeeping or managing your Housekeeping? N     Patient Care Team: Midge Minium, MD as PCP - General (Family Medicine) Elayne Snare, MD as Consulting Physician (Endocrinology) Carol Ada, MD as Consulting Physician (Gastroenterology) Madelin Rear, [facility] as Pharmacist (Pharmacist)  Indicate any recent Medical Services you may have received from other than [facility] providers in the past year (date may be approximate).     Assessment:   This is a routine wellness examination for [place].  Hearing/Vision screen Vision Screening - Comments:: Annual eye exams wears glasses   Dietary issues and exercise activities discussed: Current Exercise Habits: Home exercise routine, Type of exercise: walking, Time (Minutes): 30, Frequency (Times/Week): 3, Weekly Exercise (Minutes/Week): 90, Intensity: Mild, Exercise limited by: None identified   Goals Addressed   None    Depression Screen    03/25/2022   11:23 AM 03/25/2022    11:19 AM 03/25/2022   11:17 AM 03/19/2022   11:00 AM 02/04/2022    1:13 PM 10/26/2021   11:43 AM 06/02/2021    8:28 AM  PHQ 2/9 Scores  PHQ - 2 Score 0 0 0 0 0 2 1  PHQ- 9 Score    0 '8 13 4    '$ Fall Risk    03/25/2022   11:19 AM 03/19/2022   11:00 AM 02/04/2022    1:12 PM 10/26/2021   11:43 AM 06/02/2021    8:27 AM  Fall Risk   Falls in the past year? 0 0 0 1 0  Number falls in past yr: 0 0 0 0 0  Injury with Fall? 0 0 0 0 0  Risk for fall due to :  No Fall Risks No Fall Risks History of fall(s) No Fall Risks  Follow up Falls evaluation completed;Education provided Falls evaluation completed Falls evaluation completed Falls evaluation completed Falls evaluation completed    FALL RISK PREVENTION PERTAINING TO THE HOME:  Any stairs in or around the home? Yes  If so, are there any without handrails? No  Home free of loose throw rugs in walkways, pet beds, electrical cords, etc? Yes  Adequate lighting in your home to reduce risk of falls? Yes   ASSISTIVE DEVICES UTILIZED TO PREVENT FALLS:  Life alert? No  Use of a cane, walker or w/c? No  Grab bars in the bathroom? No  Shower chair or bench in shower? No  Elevated toilet seat or a handicapped toilet? No   Cognitive Function:  Normal cognitive status assessed by telephone conversation  by this Nurse Health Advisor. No abnormalities found.        02/23/2021    9:30 AM 02/20/2020    1:24 PM  6CIT Screen  What Year? 0 points 0 points  What month? 0 points 0 points  What time? 0 points 0 points  Count back from 20 0 points 0 points  Months in reverse 0 points 0 points  Repeat phrase 0 points 0 points  Total Score 0 points 0 points    Immunizations Immunization History  Administered Date(s) Administered   Moderna Sars-Covid-2 Vaccination 02/28/2020, 03/30/2020    TDAP status: Due, Education has been provided regarding the importance of this vaccine. Advised may receive this vaccine at local pharmacy or Health Dept. Aware to  provide a copy of the vaccination record if obtained from local pharmacy or Health Dept. Verbalized acceptance and understanding.  Flu Vaccine status: Declined, Education has been provided regarding the importance of this vaccine but patient still declined. Advised may receive this vaccine at local pharmacy or Health Dept. Aware to provide a copy of the vaccination record if obtained from local pharmacy or Health Dept. Verbalized acceptance and understanding.  Pneumococcal vaccine status: Up to date  Covid-19 vaccine status: Completed vaccines  Qualifies for Shingles Vaccine? Yes   Zostavax completed No   Shingrix Completed?: No.    Education has been provided regarding the importance of this vaccine. Patient has been advised to call insurance company to determine out of pocket expense if they have not yet received this vaccine. Advised may also receive vaccine at local pharmacy or Health Dept. Verbalized acceptance and understanding.  Screening Tests Health Maintenance  Topic Date Due   Pneumonia Vaccine 74+ Years old (1 - PCV) Never done   DEXA SCAN  Never done   COVID-19 Vaccine (3 - Moderna series) 09/01/2022 (Originally 05/25/2020)   HPV VACCINES  Aged Out   COLONOSCOPY (Pts 45-29yr Insurance coverage will need to be confirmed)  Discontinued   TETANUS/TDAP  Discontinued   Hepatitis C Screening  Discontinued   Zoster Vaccines- Shingrix  Discontinued    Health Maintenance  Health Maintenance Due  Topic Date Due   Pneumonia Vaccine 77 Years old (1 - PCV) Never done   DEXA SCAN  Never done    Colorectal cancer screening: No longer required.   Mammogram status: No longer required due to age.  Bone Density status: Ordered patient declined . Pt provided with contact info and advised to call to schedule appt.  Lung Cancer Screening: (Low Dose CT Chest recommended if Age 77-80years, 30 pack-year currently smoking OR have quit w/in 15years.) does not qualify.   Lung Cancer  Screening Referral: n/a  Additional Screening:  Hepatitis C Screening: does not qualify;    Vision Screening: Recommended annual ophthalmology exams for early detection of glaucoma and other disorders of the eye. Is the patient up to date with their annual eye exam?  Yes  Who is the provider or what is the name of the office in which the patient attends annual eye exams? [facility]  If pt is not established with a provider, would they like to be referred to a provider to establish care? No .   Dental Screening: Recommended annual dental exams for proper oral hygiene  Community Resource Referral / Chronic Care Management: CRR required this visit?  No   CCM required this visit?  No      Plan:     I have personally reviewed and noted the following in the patient's chart:   Medical and social history Use of alcohol, tobacco or illicit drugs  Current medications and supplements including opioid prescriptions.  Functional ability and status Nutritional status Physical activity Advanced directives List of other physicians Hospitalizations, surgeries, and ER visits in previous 12 months Vitals Screenings to include cognitive, depression, and falls Referrals and appointments  In addition, I have reviewed and discussed with patient certain preventive protocols, quality metrics, and best practice recommendations. A written personalized care plan for preventive services  as well as general preventive health recommendations were provided to patient.     Randel Pigg, LPN   06/19/300   Nurse Notes: none

## 2022-03-25 NOTE — Patient Instructions (Signed)
Margaret Torres , Thank you for taking time to come for your Medicare Wellness Visit. I appreciate your ongoing commitment to your health goals. Please review the following plan we discussed and let me know if I can assist you in the future.   Screening recommendations/referrals: Colonoscopy: no longer required  Mammogram: no longer required  Bone Density: declined  Recommended yearly ophthalmology/optometry visit for glaucoma screening and checkup Recommended yearly dental visit for hygiene and checkup  Vaccinations: Influenza vaccine: declined  Pneumococcal vaccine: declined  Tdap vaccine: declined  Shingles vaccine: declined     Advanced directives: none   Conditions/risks identified: none   Next appointment: none    Preventive Care 74 Years and Older, Female Preventive care refers to lifestyle choices and visits with your health care provider that can promote health and wellness. What does preventive care include? A yearly physical exam. This is also called an annual well check. Dental exams once or twice a year. Routine eye exams. Ask your health care provider how often you should have your eyes checked. Personal lifestyle choices, including: Daily care of your teeth and gums. Regular physical activity. Eating a healthy diet. Avoiding tobacco and drug use. Limiting alcohol use. Practicing safe sex. Taking low-dose aspirin every day. Taking vitamin and mineral supplements as recommended by your health care provider. What happens during an annual well check? The services and screenings done by your health care provider during your annual well check will depend on your age, overall health, lifestyle risk factors, and family history of disease. Counseling  Your health care provider may ask you questions about your: Alcohol use. Tobacco use. Drug use. Emotional well-being. Home and relationship well-being. Sexual activity. Eating habits. History of falls. Memory and  ability to understand (cognition). Work and work Statistician. Reproductive health. Screening  You may have the following tests or measurements: Height, weight, and BMI. Blood pressure. Lipid and cholesterol levels. These may be checked every 5 years, or more frequently if you are over 36 years old. Skin check. Lung cancer screening. You may have this screening every year starting at age 27 if you have a 30-pack-year history of smoking and currently smoke or have quit within the past 15 years. Fecal occult blood test (FOBT) of the stool. You may have this test every year starting at age 29. Flexible sigmoidoscopy or colonoscopy. You may have a sigmoidoscopy every 5 years or a colonoscopy every 10 years starting at age 33. Hepatitis C blood test. Hepatitis B blood test. Sexually transmitted disease (STD) testing. Diabetes screening. This is done by checking your blood sugar (glucose) after you have not eaten for a while (fasting). You may have this done every 1-3 years. Bone density scan. This is done to screen for osteoporosis. You may have this done starting at age 86. Mammogram. This may be done every 1-2 years. Talk to your health care provider about how often you should have regular mammograms. Talk with your health care provider about your test results, treatment options, and if necessary, the need for more tests. Vaccines  Your health care provider may recommend certain vaccines, such as: Influenza vaccine. This is recommended every year. Tetanus, diphtheria, and acellular pertussis (Tdap, Td) vaccine. You may need a Td booster every 10 years. Zoster vaccine. You may need this after age 13. Pneumococcal 13-valent conjugate (PCV13) vaccine. One dose is recommended after age 42. Pneumococcal polysaccharide (PPSV23) vaccine. One dose is recommended after age 62. Talk to your health care provider about which screenings  and vaccines you need and how often you need them. This information is  not intended to replace advice given to you by your health care provider. Make sure you discuss any questions you have with your health care provider. Document Released: 10/24/2015 Document Revised: 06/16/2016 Document Reviewed: 07/29/2015 Elsevier Interactive Patient Education  2017 [place] Prevention in the Home Falls can cause injuries. They can happen to people of all ages. There are many things you can do to make your home safe and to help prevent falls. What can I do on the outside of my home? Regularly fix the edges of walkways and driveways and fix any cracks. Remove anything that might make you trip as you walk through a door, such as a raised step or threshold. Trim any bushes or trees on the path to your home. Use bright outdoor lighting. Clear any walking paths of anything that might make someone trip, such as rocks or tools. Regularly check to see if handrails are loose or broken. Make sure that both sides of any steps have handrails. Any raised decks and porches should have guardrails on the edges. Have any leaves, snow, or ice cleared regularly. Use sand or salt on walking paths during winter. Clean up any spills in your garage right away. This includes oil or grease spills. What can I do in the bathroom? Use night lights. Install grab bars by the toilet and in the tub and shower. Do not use towel bars as grab bars. Use non-skid mats or decals in the tub or shower. If you need to sit down in the shower, use a plastic, non-slip stool. Keep the floor dry. Clean up any water that spills on the floor as soon as it happens. Remove soap buildup in the tub or shower regularly. Attach bath mats securely with double-sided non-slip rug tape. Do not have throw rugs and other things on the floor that can make you trip. What can I do in the bedroom? Use night lights. Make sure that you have a light by your bed that is easy to reach. Do not use any sheets or blankets that  are too big for your bed. They should not hang down onto the floor. Have a firm chair that has side arms. You can use this for support while you get dressed. Do not have throw rugs and other things on the floor that can make you trip. What can I do in the kitchen? Clean up any spills right away. Avoid walking on wet floors. Keep items that you use a lot in easy-to-reach places. If you need to reach something above you, use a strong step stool that has a grab bar. Keep electrical cords out of the way. Do not use floor polish or wax that makes floors slippery. If you must use wax, use non-skid floor wax. Do not have throw rugs and other things on the floor that can make you trip. What can I do with my stairs? Do not leave any items on the stairs. Make sure that there are handrails on both sides of the stairs and use them. Fix handrails that are broken or loose. Make sure that handrails are as long as the stairways. Check any carpeting to make sure that it is firmly attached to the stairs. Fix any carpet that is loose or worn. Avoid having throw rugs at the top or bottom of the stairs. If you do have throw rugs, attach them to the floor with carpet tape.  Make sure that you have a light switch at the top of the stairs and the bottom of the stairs. If you do not have them, ask someone to add them for you. What else can I do to help prevent falls? Wear shoes that: Do not have high heels. Have rubber bottoms. Are comfortable and fit you well. Are closed at the toe. Do not wear sandals. If you use a stepladder: Make sure that it is fully opened. Do not climb a closed stepladder. Make sure that both sides of the stepladder are locked into place. Ask someone to hold it for you, if possible. Clearly mark and make sure that you can see: Any grab bars or handrails. First and last steps. Where the edge of each step is. Use tools that help you move around (mobility aids) if they are needed. These  include: Canes. Walkers. Scooters. Crutches. Turn on the lights when you go into a dark area. Replace any light bulbs as soon as they burn out. Set up your furniture so you have a clear path. Avoid moving your furniture around. If any of your floors are uneven, fix them. If there are any pets around you, be aware of where they are. Review your medicines with your doctor. Some medicines can make you feel dizzy. This can increase your chance of falling. Ask your doctor what other things that you can do to help prevent falls. This information is not intended to replace advice given to you by your health care provider. Make sure you discuss any questions you have with your health care provider. Document Released: 07/24/2009 Document Revised: 03/04/2016 Document Reviewed: 11/01/2014 Elsevier Interactive Patient Education  2017 [employer].

## 2022-04-06 IMAGING — DX DG CHEST 2V
2 series · 2 of 2 positions shown · non-contrast
Comparison: 3486

CLINICAL DATA: Cough and wheezing

EXAM:
CHEST - 2 VIEW

[chest pa]
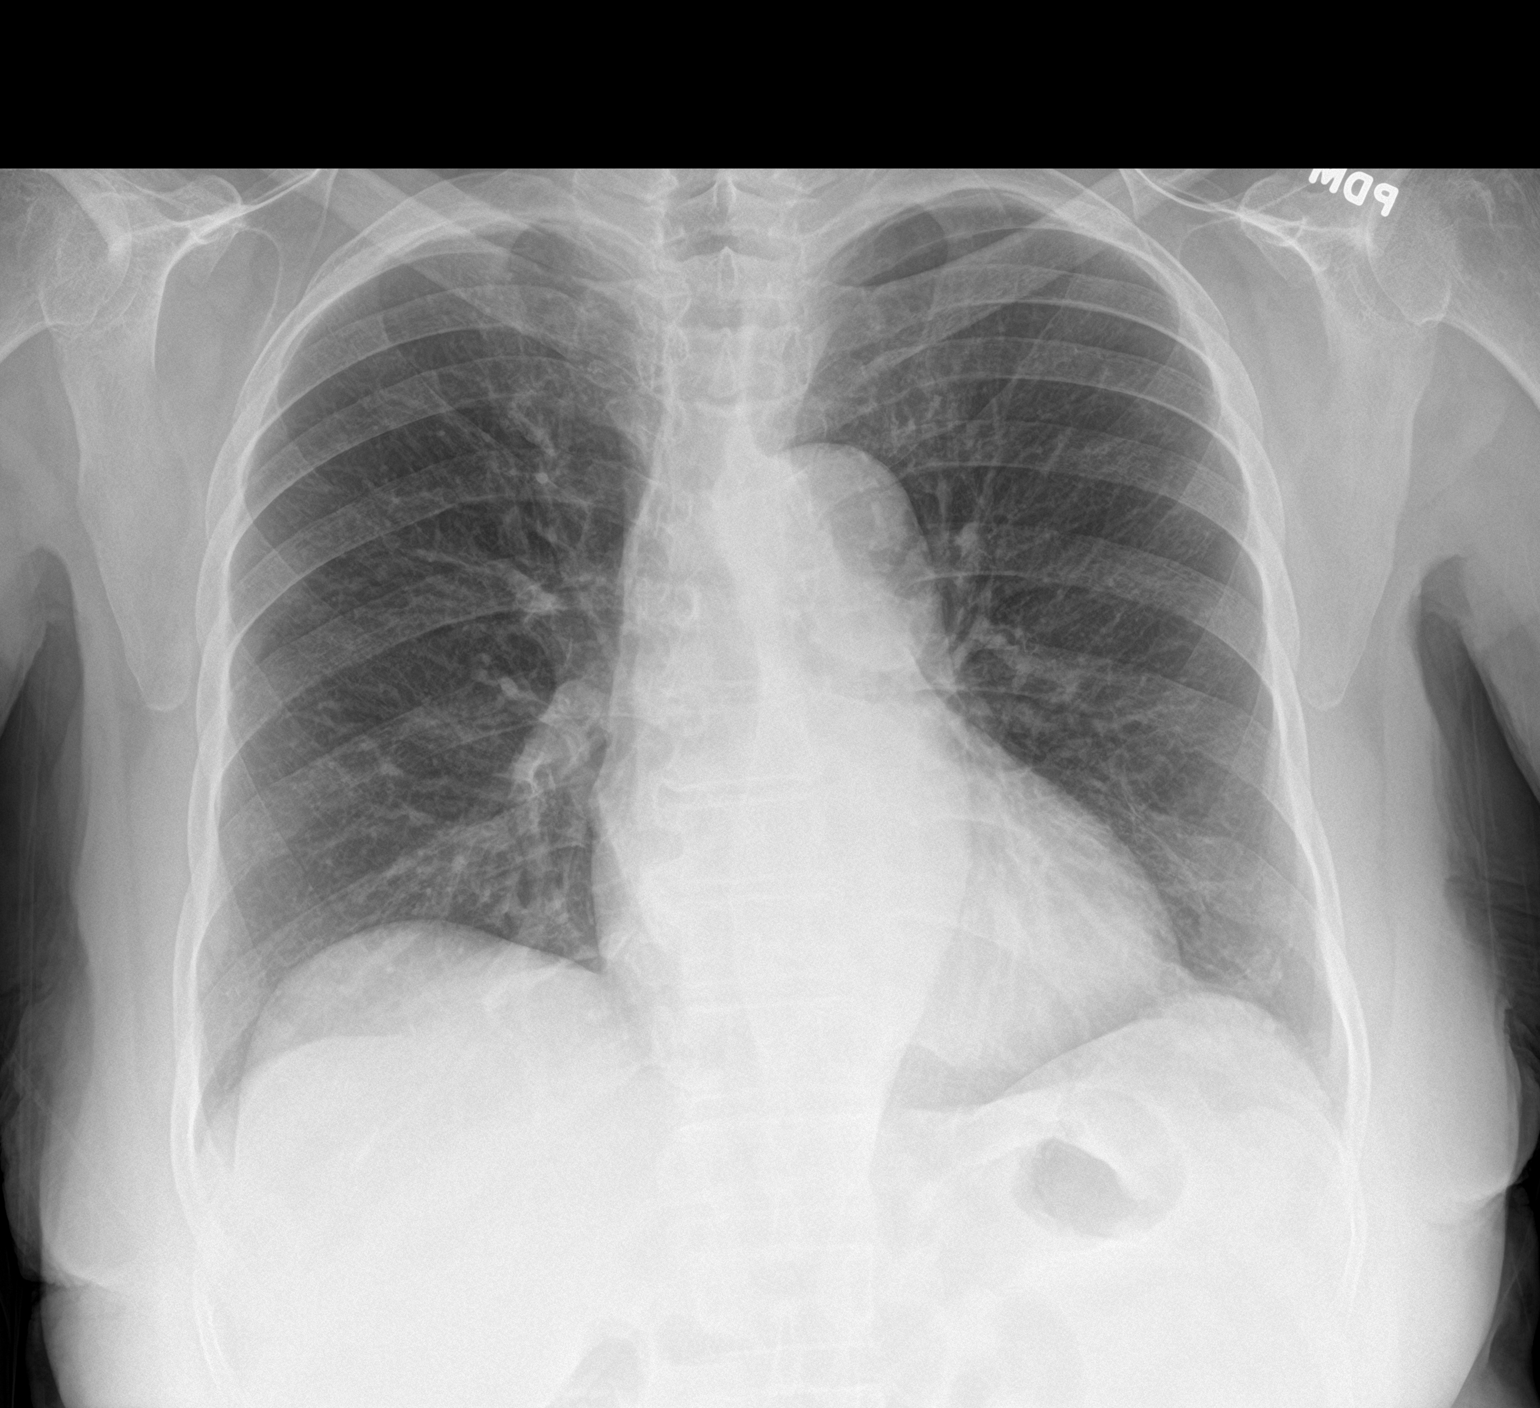

[chest lat]
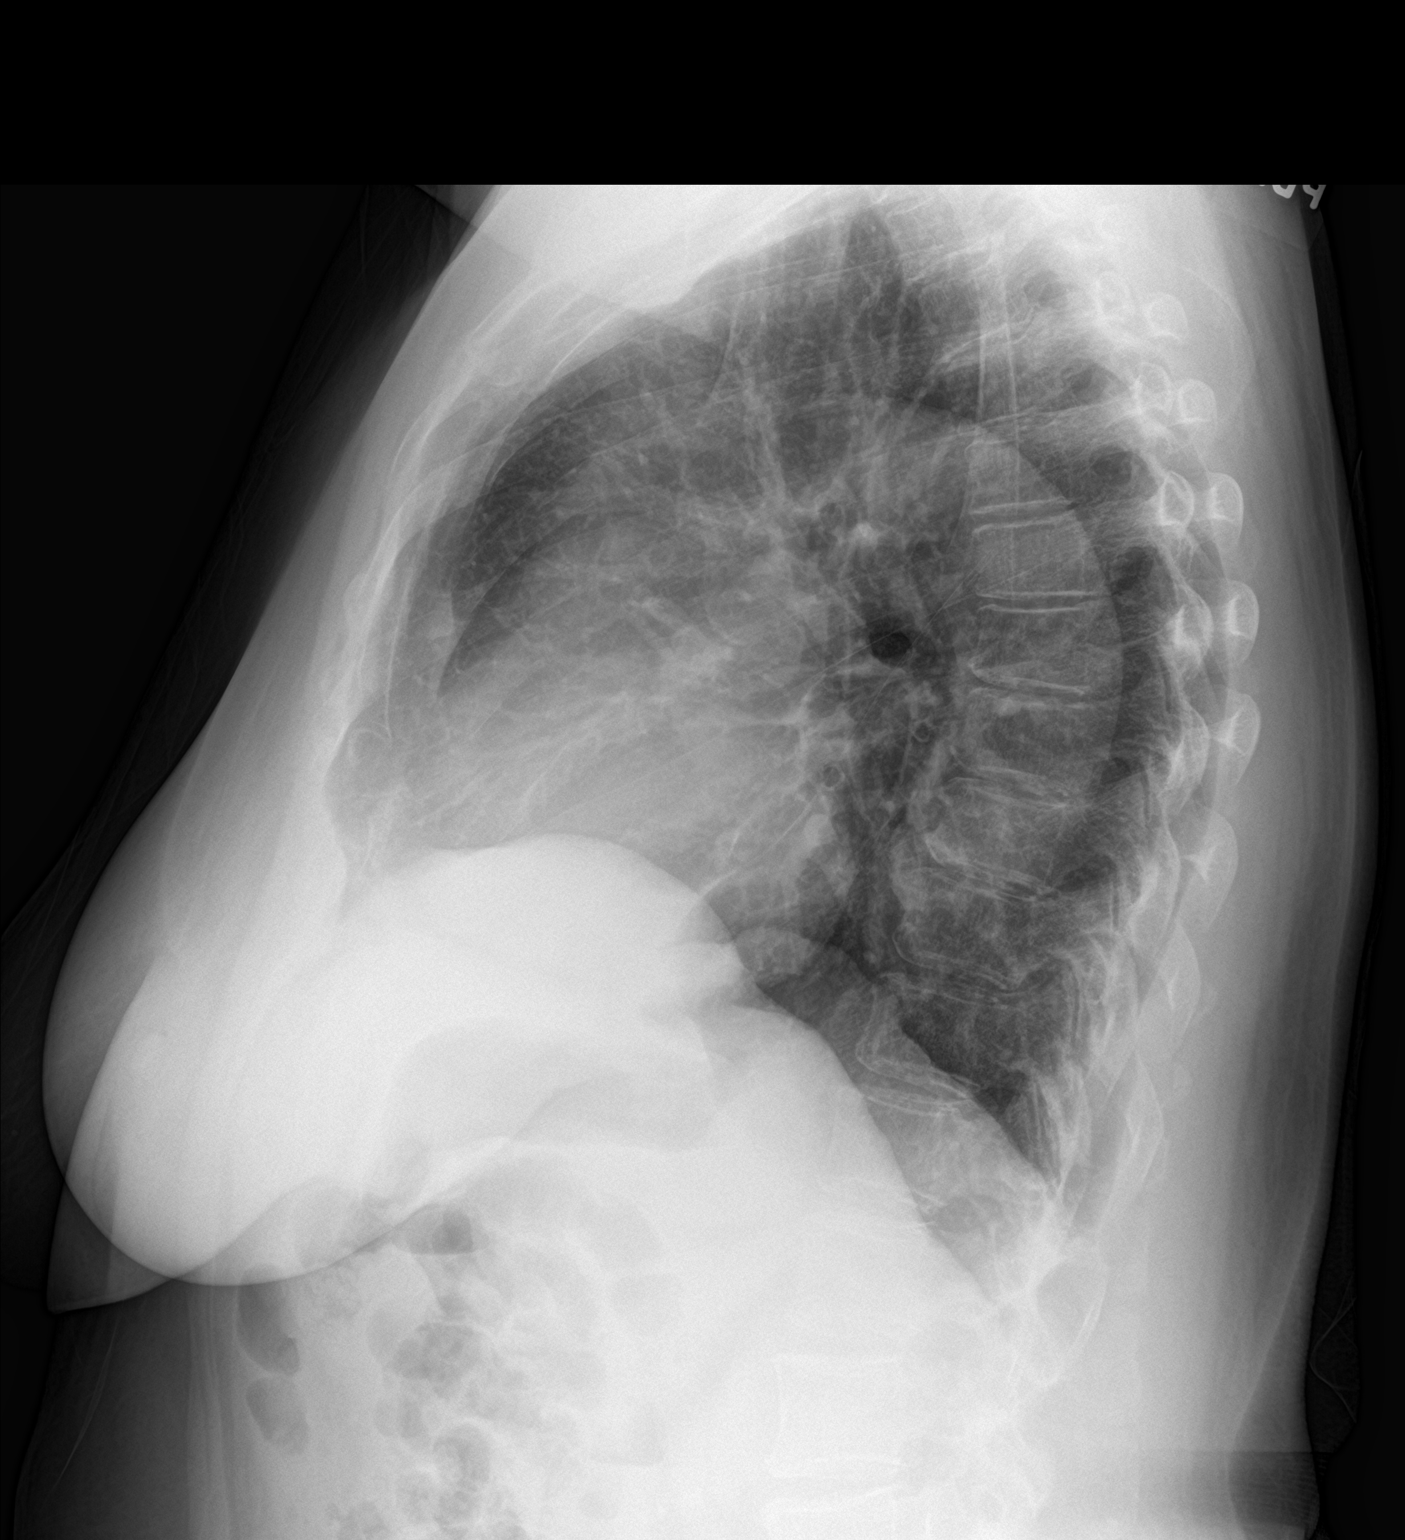

[2 of 2 positions shown; findings below may reference images not displayed]

FINDINGS: The heart size and mediastinal contours are within normal limits.
Both lungs are clear. No pleural effusion. The visualized skeletal
structures are unremarkable.
IMPRESSION: No acute process in the chest.

## 2022-04-07 ENCOUNTER — Telehealth: Payer: Self-pay

## 2022-04-07 DIAGNOSIS — F419 Anxiety disorder, unspecified: Secondary | ICD-10-CM

## 2022-04-07 MED ORDER — CLONAZEPAM 0.5 MG PO TABS: 0.5000 mg | ORAL_TABLET | Freq: Two times a day (BID) | ORAL | 1 refills | Status: DC | PRN

## 2022-04-07 NOTE — Telephone Encounter (Signed)
Prescription sent at pt's request 

## 2022-04-08 ENCOUNTER — Telehealth: Payer: Self-pay

## 2022-04-08 DIAGNOSIS — F419 Anxiety disorder, unspecified: Secondary | ICD-10-CM

## 2022-04-08 MED ORDER — CLONAZEPAM 0.5 MG PO TABS: 0.5000 mg | ORAL_TABLET | Freq: Two times a day (BID) | ORAL | 1 refills | Status: DC | PRN

## 2022-04-08 NOTE — Telephone Encounter (Signed)
Prescription sent as generic and to requested pharmacy

## 2022-04-19 ENCOUNTER — Ambulatory Visit: Payer: Medicare HMO | Admitting: Family Medicine

## 2022-04-26 ENCOUNTER — Other Ambulatory Visit: Payer: Self-pay | Admitting: Family Medicine

## 2022-04-26 DIAGNOSIS — J309 Allergic rhinitis, unspecified: Secondary | ICD-10-CM

## 2022-04-30 ENCOUNTER — Ambulatory Visit (INDEPENDENT_AMBULATORY_CARE_PROVIDER_SITE_OTHER): Payer: Medicare HMO | Admitting: Family Medicine

## 2022-04-30 ENCOUNTER — Encounter: Payer: Self-pay | Admitting: Family Medicine

## 2022-04-30 VITALS — Temp 98.8°F | Resp 16 | Ht 64.0 in | Wt 151.1 lb

## 2022-04-30 DIAGNOSIS — R198 Other specified symptoms and signs involving the digestive system and abdomen: Secondary | ICD-10-CM | POA: Diagnosis not present

## 2022-04-30 DIAGNOSIS — R350 Frequency of micturition: Secondary | ICD-10-CM | POA: Diagnosis not present

## 2022-04-30 DIAGNOSIS — R42 Dizziness and giddiness: Secondary | ICD-10-CM

## 2022-04-30 DIAGNOSIS — R195 Other fecal abnormalities: Secondary | ICD-10-CM | POA: Diagnosis not present

## 2022-04-30 LAB — POCT URINALYSIS DIPSTICK
Glucose, UA: NEGATIVE
Protein, UA: NEGATIVE
Spec Grav, UA: 1.01 (ref 1.010–1.025)
Urobilinogen, UA: 0.2 E.U./dL
pH, UA: 5.5 (ref 5.0–8.0)

## 2022-04-30 MED ORDER — POLYETHYLENE GLYCOL 3350 17 GM/SCOOP PO POWD: 17.0000 g | Freq: Two times a day (BID) | ORAL | 1 refills | Status: DC | PRN

## 2022-04-30 NOTE — Progress Notes (Signed)
   Subjective:    [name], female    DOB: 06-17-1946, 76 y.o.   MRN: [id number]  HPI Abd pain- pt reports stools seem 'narrow'.  Reports she feels 'bloated and full' after eating.  Denies blood w/ BMs.  Pt states she was typically having BMs daily but now can go every 2-3 days.  Has seen Dr Benson Norway in the past.    Urinary frequency- pt reports that starting a few days ago she would urinate and then feel the need to go again. Denies dysuria, hematuria  Lightheaded- pt reports sxs have been occurring daily recently.  Seems to occur more w/ walking but can occur when sitting.  States 'it's all of a sudden' and passes just as quickly.  Pt reports sxs are worse w/ the heat.  Trying to drink water.   Review of Systems For ROS see HPI     Objective:   Physical Exam Vitals reviewed.  Constitutional:      General: She is not in acute distress.    Appearance: She is well-developed. She is not ill-appearing.  HENT:     Head: Normocephalic and atraumatic.  Eyes:     Conjunctiva/sclera: Conjunctivae normal.     Pupils: Pupils are equal, round, and reactive to light.  Neck:     Thyroid: No thyromegaly.  Cardiovascular:     Rate and Rhythm: Normal rate and regular rhythm.     Heart sounds: Normal heart sounds. No murmur heard. Pulmonary:     Effort: Pulmonary effort is normal. No respiratory distress.     Breath sounds: Normal breath sounds.  Abdominal:     General: There is distension (mild).     Palpations: Abdomen is soft.     Tenderness: There is no abdominal tenderness. There is no guarding or rebound.  Musculoskeletal:     Cervical back: Normal range of motion and neck supple.  Lymphadenopathy:     Cervical: No cervical adenopathy.  Skin:    General: Skin is warm and dry.  Neurological:     General: No focal deficit present.     Mental Status: She is alert and oriented to person, place, and time.  Psychiatric:        Mood and Affect: Mood is anxious.         Behavior: Behavior normal.          Assessment & Plan:  Urinary frequency- new.  Pt reports she will void and then feel the urge to go again.  Denies burning or hematuria.  Will get cx due to presence of leuks.  Will hold on abx until cx results available.  Pt expressed understanding and is in agreement w/ plan.   Light headed- new.  Pt notes sxs are worse with the heat.  Admits she is likely not drinking enough.  Encouraged increased water intake and changing positions slowly to allow BP time to adjust.  Pt expressed understanding and is in agreement w/ plan.   Change in BMs- it sounds as if pt is now constipated.  Will start Miralax while awaiting GI evaluation.  Pt expressed understanding and is in agreement w/ plan.

## 2022-04-30 NOTE — Patient Instructions (Signed)
Follow up as needed or as scheduled We'll call you with your GI appt (Dr Benson Norway) Continue to drink LOTS of water Change positions slowly when getting up- especially from sitting Start the Miralax once daily to help get bowels moving and prevent constipation Call with any questions or concerns Hang in there!!!

## 2022-05-02 LAB — URINE CULTURE
MICRO NUMBER:: 13679977
Result:: NO GROWTH
SPECIMEN QUALITY:: ADEQUATE

## 2022-05-03 NOTE — Progress Notes (Signed)
Left pt a vm informing her of the lab results

## 2022-05-09 NOTE — Assessment & Plan Note (Signed)
Pt has hx of this.  Pt reports stools are narrow and she is 'bloated and full' after eating.  She states her BMs have shifted from daily to every 2-3 days.  Pt needs to see GI- referral placed despite pt's reluctance

## 2022-05-14 ENCOUNTER — Ambulatory Visit (INDEPENDENT_AMBULATORY_CARE_PROVIDER_SITE_OTHER): Payer: Medicare HMO | Admitting: Psychology

## 2022-05-14 ENCOUNTER — Ambulatory Visit: Payer: Medicare HMO | Admitting: Psychology

## 2022-05-14 ENCOUNTER — Encounter: Payer: Self-pay | Admitting: Psychology

## 2022-05-14 DIAGNOSIS — R4189 Other symptoms and signs involving cognitive functions and awareness: Secondary | ICD-10-CM

## 2022-05-14 DIAGNOSIS — Z7729 Contact with and (suspected ) exposure to other hazardous substances: Secondary | ICD-10-CM

## 2022-05-14 DIAGNOSIS — F330 Major depressive disorder, recurrent, mild: Secondary | ICD-10-CM

## 2022-05-14 DIAGNOSIS — G3184 Mild cognitive impairment, so stated: Secondary | ICD-10-CM

## 2022-05-14 HISTORY — DX: Mild cognitive impairment of uncertain or unknown etiology: G31.84

## 2022-05-14 NOTE — Progress Notes (Signed)
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       D-KEFS Verbal Fluency Test: Raw Score (Scaled Score) Percentile     Letter Total Correct 46 (13) 84 Above Average    Category Total Correct 23 (6) 9 Below Average    Category Switching Total Correct 9 (6) 9 Below Average    Category Switching Accuracy 8 (7) 16 Below Average      Total Set Loss Errors 2 (10) 50 Average      Total Repetition Errors 2 (11) 63 Average       NAB Executive Functions Module, Form 1: T Score Percentile     Judgment 48 42 Average       Language:      Raw Score Percentile   Sentence Repetition: 14/22 27 Average       Verbal Fluency Test: Raw Score (T Score) Percentile     Phonemic Fluency (FAS) 46 (65) 93 Well Above Average    Animal Fluency 13 (47) 38 Average        NAB Language Module, Form 1: T Score Percentile     Auditory Comprehension 58 79 Above Average    Naming 27/31 (38) 12 Below Average       Visuospatial/Visuoconstruction:      Raw Score Percentile   Clock Drawing: 6/10 --- Impaired        Scaled Score Percentile   WAIS-IV Block Design: 10 50 Average       Mood and Personality:      Raw Score Percentile   Geriatric Depression Scale: 12 ---  Mild  Geriatric Anxiety Scale: 16 --- Mild    Somatic 9 --- Mild    Cognitive 3 --- Mild    Affective 4 --- Mild       Additional Questionnaires:      Raw Score Percentile   PROMIS Sleep Disturbance Questionnaire: 24 --- None to Slight   Informed Consent and Coding/Compliance:   The current evaluation represents a clinical evaluation for the purposes previously outlined by the referral source and is in no way reflective of a forensic  evaluation.   Ms. Snead was provided with a verbal description of the nature and purpose of the present neuropsychological evaluation. Also reviewed were the foreseeable risks and/or discomforts and benefits of the procedure, limits of confidentiality, and mandatory reporting requirements of this provider. The patient was given the opportunity to ask questions and receive answers about the evaluation. Oral consent to participate was provided by the patient.   This evaluation was conducted by Christia Reading, Ph.D., ABPP-CN, board certified clinical neuropsychologist. Ms. Rando completed a clinical interview with Dr. Melvyn Novas, billed as one unit [phone], and 35 minutes of cognitive testing and scoring, billed as one unit [phone] and four additional units [id number]. Psychometrist Milana Kidney, B.S., assisted Dr. Melvyn Novas with test administration and scoring procedures. As a separate and discrete service, Dr. Melvyn Novas spent a total of 160 minutes in interpretation and report writing billed as one unit [phone] and two units 96133.

## 2022-05-14 NOTE — Progress Notes (Signed)
   Psychometrician Note   Cognitive testing was administered to Margaret Torres by Milana Kidney, B.S. (psychometrist) under the supervision of Dr. Christia Reading, Ph.D., licensed psychologist on 05/14/2022. Ms. Sun did not appear overtly distressed by the testing session per behavioral observation or responses across self-report questionnaires. Rest breaks were offered.    The battery of tests administered was selected by Dr. Christia Reading, Ph.D. with consideration to Ms. Milholland's current level of functioning, the nature of her symptoms, emotional and behavioral responses during interview, level of literacy, observed level of motivation/effort, and the nature of the referral question. This battery was communicated to the psychometrist. Communication between Dr. Christia Reading, Ph.D. and the psychometrist was ongoing throughout the evaluation and Dr. Christia Reading, Ph.D. was immediately accessible at all times. Dr. Christia Reading, Ph.D. provided supervision to the psychometrist on the date of this service to the extent necessary to assure the quality of all services provided.    Taitum Menton will return within approximately 1-2 weeks for an interactive feedback session with Dr. Melvyn Novas at which time her test performances, clinical impressions, and treatment recommendations will be reviewed in detail. Ms. Wheat understands she can contact our office should she require our assistance before this time.  A total of 135 minutes of billable time were spent face-to-face with Ms. Lesesne by the psychometrist. This includes both test administration and scoring time. Billing for these services is reflected in the clinical report generated by Dr. Christia Reading, Ph.D.  This note reflects time spent with the psychometrician and does not include test scores or any clinical interpretations made by Dr. Melvyn Novas. The full report will follow in a separate note.

## 2022-05-17 ENCOUNTER — Encounter: Payer: Self-pay | Admitting: Psychology

## 2022-05-17 DIAGNOSIS — Z7729 Contact with and (suspected ) exposure to other hazardous substances: Secondary | ICD-10-CM | POA: Insufficient documentation

## 2022-05-24 ENCOUNTER — Ambulatory Visit: Payer: Medicare HMO | Admitting: Psychology

## 2022-05-24 DIAGNOSIS — G3184 Mild cognitive impairment, so stated: Secondary | ICD-10-CM | POA: Diagnosis not present

## 2022-05-24 NOTE — Progress Notes (Signed)
   Neuropsychology Feedback Session Margaret Torres. [facility]  Reason for Referral:   Margaret Torres is a 76 y.o. right-handed African-American female referred by  Annye Asa, M.D. , to characterize her current cognitive functioning and assist with diagnostic clarity and treatment planning in the context of subjective cognitive decline.   Feedback:   Margaret Torres completed a comprehensive neuropsychological evaluation on 05/14/2022. Please refer to that encounter for the full report and recommendations. Briefly, results suggested ongoing weakness/impairment surrounding encoding (i.e., learning) and retrieval aspects of verbal memory, with the latter exhibiting greater impairment. Additional weaknesses were noted across confrontation naming and clock drawing, with performance variability across more complex attention. Medical records have not outright suggested CO toxicity, making the influence of this aspect of her medical history challenging to decipher. Broadly speaking, CO poisoning often exhibits delayed neurological deterioration and can affect major brain systems involved in movement and cognition. Deficits in processing speed, attention/concentration, executive functioning, and learning and memory are common. In addition, Margaret Torres reported mild psychiatric distress and ongoing sleep dysfunction. This, when combined with side effects stemming from regular use of clonazepam and various chronic medical ailments, can certainly affect cognitive abilities and overall efficiency. There is plausibility to this combined etiology as the primary driver of subjective and objective dysfunction. With that being said, I cannot rule out the extremely early stages of a neurodegenerative illness, particularly Alzheimer's disease, at the present time. Across verbal memory tasks, delayed retention was 0% to 14%, suggestive of rapid forgetting. This, when combined with a  relative weakness across confrontation naming, is certainly concerning for this illness. However, Margaret Torres demonstrated generally intact recognition/consolidation memory scores, which is not suggestive of a profound memory storage impairment presently. This is certainly encouraging. Continued medical monitoring will be important moving forward.   Margaret Torres was unaccompanied during the current feedback session. Content of the current session focused on the results of her neuropsychological evaluation. Margaret Torres was given the opportunity to ask questions and her questions were answered. She was encouraged to reach out should additional questions arise. A copy of her report was provided at the conclusion of the visit.      30 minutes were spent conducting the current feedback session with Margaret Torres, billed as one unit [phone].

## 2022-05-26 ENCOUNTER — Ambulatory Visit (INDEPENDENT_AMBULATORY_CARE_PROVIDER_SITE_OTHER): Payer: Medicare HMO

## 2022-05-26 ENCOUNTER — Ambulatory Visit
Admission: EM | Admit: 2022-05-26 | Discharge: 2022-05-26 | Disposition: A | Discharge status: Home / Self Care | Payer: Medicare HMO | Attending: Internal Medicine | Admitting: Internal Medicine

## 2022-05-26 DIAGNOSIS — R5383 Other fatigue: Secondary | ICD-10-CM

## 2022-05-26 DIAGNOSIS — K5900 Constipation, unspecified: Secondary | ICD-10-CM

## 2022-05-26 DIAGNOSIS — W57XXXA Bitten or stung by nonvenomous insect and other nonvenomous arthropods, initial encounter: Secondary | ICD-10-CM

## 2022-05-26 DIAGNOSIS — R739 Hyperglycemia, unspecified: Secondary | ICD-10-CM | POA: Diagnosis not present

## 2022-05-26 DIAGNOSIS — R42 Dizziness and giddiness: Secondary | ICD-10-CM

## 2022-05-26 LAB — POCT FASTING CBG KUC MANUAL ENTRY: POCT Glucose (KUC): 309 mg/dL — AB (ref 70–99)

## 2022-05-26 MED ORDER — POLYETHYLENE GLYCOL 3350 17 GM/SCOOP PO POWD: 17.0000 g | Freq: Two times a day (BID) | ORAL | 0 refills | Status: DC | PRN

## 2022-05-26 MED ORDER — DOXYCYCLINE HYCLATE 100 MG PO CAPS: 100.0000 mg | ORAL_CAPSULE | Freq: Two times a day (BID) | ORAL | 0 refills | Status: DC

## 2022-05-26 NOTE — ED Triage Notes (Signed)
Report ticks bite 3 weeks ago and did not feel well since then.  Light headache , weakness, fatigue, stomach (irregular BM) very small bowel movement. Felling bloated.  Pain when digests level 1: uncomfortable Took milk of magnesium  Onset 3 weeks ago  2 bitted spots on both spots

## 2022-05-26 NOTE — ED Provider Notes (Signed)
[name]    CSN: [id number] Arrival date & time: 05/26/22  3710      History   Chief Complaint Chief Complaint  Patient presents with   Insect Bite    HPI Margaret Torres is a 76 y.o. female.   Patient presents with multiple different chief complaints today.  She reports that she had 2-3 tick bites that have been occurring intermittently over the past 3 weeks.  She states that she pulled them off herself and thinks that they were fully intact. She is not sure how long they were attached.  She states that she got them from working outside and mowing her lawn.  She reports that she has been having some dizziness, weakness, fatigue since the tick bites have occurred.  Denies any associated fever, muscle cramps, generalized body aches, nausea, vomiting.  States that tick bites were present to bilateral breasts and possibly to left upper back.  She also reports that she is concerned about her blood sugar as she has felt very dizzy over the past few weeks and notices that it worsens when she eats a meal.  She reports that she has never had issues with her blood glucose in the past and does not currently take any diabetes medications. She is requesting her blood glucose to be checked.   Patient also complaining of irregular bowel movements and that her bowel movements have been "narrow".  Denies any associated abdominal pain but states that she has been feeling very bloated and has been having a lot of flatulence lately.  Denies nausea, vomiting, blood in stool.  Her last bowel movement was approximately 4 days ago.  She states that she has been having a bowel movement on average about every 3 days which is abnormal for her.  She states that she typically has bowel movements daily prior to symptoms starting.  She saw her PCP when the symptoms first started and was prescribed MiraLAX twice daily PRN which she has been taking intermittently.  She states that it was not as beneficial so  she started taking milk of magnesia which provided some benefit.  Denies any prior history of gastrointestinal problems.  She also saw her PCP about dizziness and was encouraged to increase water intake but the patient states that she has been drinking plenty of water with no improvement in symptoms.     Past Medical History:  Diagnosis Date   Allergic rhinitis 07/16/2007   Carbon monoxide exposure    Dysphagia 03/02/2016   Essential hypertension 07/16/2007   First degree hemorrhoids 11/28/2020   Generalized abdominal pain 11/28/2020   GERD (gastroesophageal reflux disease) 10/12/2018   Hemorrhage of rectum and anus 11/28/2020   History of cervical cancer    Hyperlipidemia 06/26/2007   Hyperthyroidism 04/21/2016   Insomnia 06/20/2014   Left anterior knee pain 09/30/2014   Leg pain, bilateral 03/02/2016   Major depressive disorder 07/16/2007   Mild cognitive impairment with memory loss 05/14/2022   Multiple thyroid nodules 12/30/2017   Olecranon bursitis of left elbow 01/30/2016   Primary osteoarthritis of both knees 10/20/2018   Second degree hemorrhoids 11/28/2020   Vitamin D deficiency 03/19/2022   Wheezing 11/17/2020    Patient Active Problem List   Diagnosis Date Noted   Carbon monoxide exposure    Mild cognitive impairment with memory loss 05/14/2022   Vitamin D deficiency 03/19/2022   Abnormal feces 11/28/2020   First degree hemorrhoids 11/28/2020   Generalized abdominal pain 11/28/2020   Second degree  hemorrhoids 11/28/2020   Hemorrhage of rectum and anus 11/28/2020   Wheezing 11/17/2020   Primary osteoarthritis of both knees 10/20/2018   GERD (gastroesophageal reflux disease) 10/12/2018   Multiple thyroid nodules 12/30/2017   Diarrhea 09/20/2016   Hyperthyroidism 04/21/2016   Bowel incontinence 04/21/2016   Leg pain, bilateral 03/02/2016   Dysphagia 03/02/2016   Olecranon bursitis of left elbow 01/30/2016   Bilateral renal cysts 01/27/2015   Left anterior  knee pain 09/30/2014   Insomnia 06/20/2014   Hemorrhoids, external 03/27/2014   Major depressive disorder 07/16/2007   Essential hypertension 07/16/2007   Allergic rhinitis 07/16/2007   Hyperlipidemia 06/26/2007   TAH/BSO, HX OF 06/26/2007    Past Surgical History:  Procedure Laterality Date   ABDOMINAL HYSTERECTOMY     WISDOM TOOTH EXTRACTION Bilateral     OB History   No obstetric history on file.      Home Medications    Prior to Admission medications   Medication Sig Start Date End Date Taking? Authorizing Provider  doxycycline (VIBRAMYCIN) 100 MG capsule Take 1 capsule (100 mg total) by mouth 2 (two) times daily. 05/26/22  Yes , Hildred Alamin E, FNP  amLODipine (NORVASC) 5 MG tablet Take 1 tablet (5 mg total) by mouth daily. 03/19/22   Midge Minium, MD  clonazePAM (KLONOPIN) 0.5 MG tablet Take 1 tablet (0.5 mg total) by mouth 2 (two) times daily as needed. 04/08/22   Midge Minium, MD  fluticasone Asencion Islam) 50 MCG/ACT nasal spray Use 2 spray(s) in each nostril once daily 04/26/22   Midge Minium, MD  loratadine (CLARITIN) 10 MG tablet Take 1 tablet (10 mg total) by mouth daily as needed for allergies. Take 10 mg by mouth daily as needed for allergies. 10/26/21   Midge Minium, MD  meclizine (ANTIVERT) 25 MG tablet TAKE 1 TABLET BY MOUTH THREE TIMES DAILY AS NEEDED FOR DIZZINESS 11/02/21   Maximiano Coss, NP  pantoprazole (PROTONIX) 40 MG tablet Take 1 tablet (40 mg total) by mouth daily. 03/19/22   Midge Minium, MD  polyethylene glycol powder (GLYCOLAX/MIRALAX) 17 GM/SCOOP powder Take 17 g by mouth 2 (two) times daily as needed. 05/26/22   Teodora Medici, FNP  sucralfate (CARAFATE) 1 g tablet Take 1 tablet (1 g total) by mouth 3 (three) times daily with meals. 03/19/22   Midge Minium, MD  Vitamin D, Ergocalciferol, (DRISDOL) 1.25 MG (50000 UNIT) CAPS capsule Take 1 capsule (50,000 Units total) by mouth every 7 (seven) days. 02/08/22   Midge Minium, MD    Family History Family History  Problem Relation Age of Onset   Cervical cancer Mother    Hypertension Mother    Hypertension Father    Diabetes Sister    Stroke Sister    Cancer Sister        bone cancer   Breast cancer Sister    Heart disease Brother    Diabetes Brother    Stroke Maternal Grandmother    Heart attack Son    Cancer Other    Autism spectrum disorder Other    Thyroid disease Neg Hx     Social History Social History   Tobacco Use   Smoking status: Never   Smokeless tobacco: Never  Substance Use Topics   Alcohol use: No   Drug use: No     Allergies   Influenza vaccines   Review of Systems Review of Systems Per HPI  Physical Exam Triage Vital Signs ED Triage Vitals  Enc Vitals Group     BP 05/26/22 0835 (!) 155/76     Pulse Rate 05/26/22 0835 65     Resp --      Temp 05/26/22 0835 98 F (36.7 C)     Temp Source 05/26/22 0835 Oral     SpO2 05/26/22 0835 98 %     Weight --      Height 05/26/22 0846 '5\' 5"'$  (1.651 m)     Head Circumference --      Peak Flow --      Pain Score 05/26/22 0838 1     Pain Loc --      Pain Edu? --      Excl. in [place]? --    No data found.  Updated Vital Signs BP (!) 155/76 (BP Location: Left Arm)   Pulse 65   Temp 98 F (36.7 C) (Oral)   Ht '5\' 5"'$  (1.651 m)   LMP 06/30/2013   SpO2 98%   BMI 25.15 kg/m   Visual Acuity Right Eye Distance:   Left Eye Distance:   Bilateral Distance:    Right Eye Near:   Left Eye Near:    Bilateral Near:     Physical Exam Constitutional:      General: She is not in acute distress.    Appearance: Normal appearance. She is not toxic-appearing or diaphoretic.  HENT:     Head: Normocephalic and atraumatic.     Right Ear: Tympanic membrane and ear canal normal.     Left Ear: Tympanic membrane and ear canal normal.  Eyes:     Extraocular Movements: Extraocular movements intact.     Conjunctiva/sclera: Conjunctivae normal.     Pupils: Pupils are equal, round,  and reactive to light.  Cardiovascular:     Rate and Rhythm: Normal rate and regular rhythm.     Pulses: Normal pulses.     Heart sounds: Normal heart sounds.  Pulmonary:     Effort: Pulmonary effort is normal. No respiratory distress.     Breath sounds: Normal breath sounds.  Abdominal:     General: Bowel sounds are normal. There is no distension.     Palpations: Abdomen is soft.     Tenderness: There is no abdominal tenderness.  Skin:    Comments: Pinpoint, erythematous, papular lesions present to bilateral upper breasts.  No drainage noted.  No associated discoloration, lesion, signs of infection to left upper back where patient reports that other tick bite was noted.  Neurological:     General: No focal deficit present.     Mental Status: She is alert and oriented to person, place, and time. Mental status is at baseline.     Cranial Nerves: Cranial nerves 2-12 are intact.     Sensory: Sensation is intact.     Motor: Motor function is intact.     Coordination: Coordination is intact.     Gait: Gait is intact.  Psychiatric:        Mood and Affect: Mood normal.        Behavior: Behavior normal.        Thought Content: Thought content normal.        Judgment: Judgment normal.      UC Treatments / Results  Labs (all labs ordered are listed, but only abnormal results are displayed) Labs Reviewed  POCT FASTING CBG [facility] MANUAL ENTRY - Abnormal; Notable for the following components:      Result Value   POCT Glucose ([place])  309 (*)    All other components within normal limits  CBC  COMPREHENSIVE METABOLIC PANEL  LYME DISEASE SEROLOGY W/REFLEX  HEMOGLOBIN A1C    EKG   Radiology DG Abd 2 Views  Result Date: 05/26/2022 CLINICAL DATA:  Constipation EXAM: ABDOMEN - 2 VIEW COMPARISON:  2014 FINDINGS: Unremarkable bowel gas pattern. Moderate stool burden. No acute osseous abnormality. IMPRESSION: Moderate stool burden. Electronically Signed   By: Macy Mis M.D.   On:  05/26/2022 09:19    Procedures Procedures (including critical care time)  Medications Ordered in UC Medications - No data to display  Initial Impression / Assessment and Plan / UC Course  I have reviewed the triage vital signs and the nursing notes.  Pertinent labs & imaging results that were available during my care of the patient were reviewed by me and considered in my medical decision making (see chart for details).     Patient reports multiple tick bites to skin.  Will treat with doxycycline for 10 days.  Advised patient to take this food with the medication as it can cause nausea.  Patient is concerned about blood glucose.  Blood glucose in urgent care today was 309.  No prior history of diabetes noted on patient's chart and patient does not take any current diabetes medications.  Highly suggested to patient that we start metformin diabetes medication today given blood sugar noted in urgent care but patient declined.  Risks associated with not starting diabetes medications today with associated hyperglycemia were discussed with patient.  Patient voiced understanding.  Given patient declined taking metformin, patient was encouraged to follow-up with primary care doctor as soon as possible today and to get a home glucose monitor from the pharmacy and monitor very closely.  Patient voiced understanding and was agreeable with plan.  Abdominal x-ray showing moderate stool burden but no signs of impaction or obstruction.  I do think that patient would benefit from seeing GI specialist given persistent symptoms and "narrow stool" to rule out any worrisome etiologies.  Patient can continue MiraLAX as this will be the safest medication for patient as well.  Advised patient to follow-up with PCP for this as well but patient was also provided with GI contact information for further evaluation and management.  Patient encouraged to call them herself as I am not able to do an official referral.  Advised  patient that if official referral is warranted per GI specialty that she will need to follow-up with PCP for this referral.  Patient was given strict return and ER precautions for all chief complaints today.  Patient verbalized understanding and was agreeable with plan.  CMP, CBC, hemoglobin A1c, lyme disease bloodwork pending.   I am coding this is a level 4 given multiple chief complaints addressed today and amount of time spent with patient. Final Clinical Impressions(s) / UC Diagnoses   Final diagnoses:  Tick bite, unspecified site, initial encounter  Dizziness and giddiness  Other fatigue  Constipation, unspecified constipation type  Hyperglycemia     Discharge Instructions      You have been prescribed doxycycline antibiotic for recent tick bite.  Please take this medication with food to avoid nausea.  Your blood work is pending due to your high blood sugar as well as your complaint of fatigue and dizziness.  We will call if there are any abnormalities.  Please call your primary care doctor as  soon as possible.  Also recommend that you follow-up with a GI doctor which is  a stomach doctor for further evaluation and management of your stomach symptoms.  Please call them yourself and try to schedule an appointment.  If they tell you that they need an official referral, please follow-up with primary care doctor for this.  Your blood sugar was very high today.  I did recommend that you start diabetes medications but given that you wish to wait, I recommend that you get a blood glucose monitor from the pharmacy and monitor very closely.  This is another reason that you need to follow-up with primary care doctor soon as possible.  We will be in touch if blood work is abnormal.    ED Prescriptions     Medication Sig Dispense Auth. Provider   doxycycline (VIBRAMYCIN) 100 MG capsule Take 1 capsule (100 mg total) by mouth 2 (two) times daily. 20 capsule [place], [place] E, [place]   polyethylene  glycol powder (GLYCOLAX/MIRALAX) 17 GM/SCOOP powder Take 17 g by mouth 2 (two) times daily as needed. 3,350 g Teodora Medici, [place]      PDMP not reviewed this encounter.   Teodora Medici, [place] 05/26/22 1004

## 2022-05-26 NOTE — Discharge Instructions (Addendum)
You have been prescribed doxycycline antibiotic for recent tick bite.  Please take this medication with food to avoid nausea.  Your blood work is pending due to your high blood sugar as well as your complaint of fatigue and dizziness.  We will call if there are any abnormalities.  Please call your primary care doctor as  soon as possible.  Also recommend that you follow-up with a GI doctor which is a stomach doctor for further evaluation and management of your stomach symptoms.  Please call them yourself and try to schedule an appointment.  If they tell you that they need an official referral, please follow-up with primary care doctor for this.  Your blood sugar was very high today.  I did recommend that you start diabetes medications but given that you wish to wait, I recommend that you get a blood glucose monitor from the pharmacy and monitor very closely.  This is another reason that you need to follow-up with primary care doctor soon as possible.  We will be in touch if blood work is abnormal.

## 2022-05-27 ENCOUNTER — Encounter: Payer: Self-pay | Admitting: Internal Medicine

## 2022-05-27 LAB — COMPREHENSIVE METABOLIC PANEL
ALT: 20 IU/L (ref 0–32)
AST: 17 IU/L (ref 0–40)
Albumin/Globulin Ratio: 1.8 (ref 1.2–2.2)
Albumin: 4.6 g/dL (ref 3.8–4.8)
Alkaline Phosphatase: 75 IU/L (ref 44–121)
BUN/Creatinine Ratio: 16 (ref 12–28)
BUN: 18 mg/dL (ref 8–27)
Bilirubin Total: 0.5 mg/dL (ref 0.0–1.2)
CO2: 22 mmol/L (ref 20–29)
Calcium: 9.5 mg/dL (ref 8.7–10.3)
Chloride: 105 mmol/L (ref 96–106)
Creatinine, Ser: 1.12 mg/dL — ABNORMAL HIGH (ref 0.57–1.00)
Globulin, Total: 2.5 g/dL (ref 1.5–4.5)
Glucose: 93 mg/dL (ref 70–99)
Potassium: 4.1 mmol/L (ref 3.5–5.2)
Sodium: 141 mmol/L (ref 134–144)
Total Protein: 7.1 g/dL (ref 6.0–8.5)
eGFR: 51 mL/min/{1.73_m2} — ABNORMAL LOW (ref >59)

## 2022-05-27 LAB — CBC
Hematocrit: 37.3 % (ref 34.0–46.6)
Hemoglobin: 12.4 g/dL (ref 11.1–15.9)
MCH: 30.5 pg (ref 26.6–33.0)
MCHC: 33.2 g/dL (ref 31.5–35.7)
MCV: 92 fL (ref 79–97)
Platelets: 294 10*3/uL (ref 150–450)
RBC: 4.07 x10E6/uL (ref 3.77–5.28)
RDW: 13 % (ref 11.7–15.4)
WBC: 5.3 10*3/uL (ref 3.4–10.8)

## 2022-05-27 LAB — HEMOGLOBIN A1C
Est. average glucose Bld gHb Est-mCnc: 114 mg/dL
Hgb A1c MFr Bld: 5.6 % (ref 4.8–5.6)

## 2022-05-27 LAB — LYME DISEASE SEROLOGY W/REFLEX: Lyme Total Antibody EIA: NEGATIVE

## 2022-06-03 ENCOUNTER — Encounter: Payer: Self-pay | Admitting: Family Medicine

## 2022-06-03 ENCOUNTER — Ambulatory Visit (INDEPENDENT_AMBULATORY_CARE_PROVIDER_SITE_OTHER): Payer: Medicare HMO | Admitting: Family Medicine

## 2022-06-03 ENCOUNTER — Other Ambulatory Visit: Payer: Self-pay

## 2022-06-03 VITALS — BP 142/90 | HR 76 | Temp 97.7°F | Resp 17 | Ht 63.0 in | Wt 148.5 lb

## 2022-06-03 DIAGNOSIS — R42 Dizziness and giddiness: Secondary | ICD-10-CM | POA: Diagnosis not present

## 2022-06-03 DIAGNOSIS — R55 Syncope and collapse: Secondary | ICD-10-CM

## 2022-06-03 DIAGNOSIS — I499 Cardiac arrhythmia, unspecified: Secondary | ICD-10-CM | POA: Diagnosis not present

## 2022-06-03 DIAGNOSIS — I10 Essential (primary) hypertension: Secondary | ICD-10-CM

## 2022-06-03 DIAGNOSIS — E559 Vitamin D deficiency, unspecified: Secondary | ICD-10-CM | POA: Diagnosis not present

## 2022-06-03 DIAGNOSIS — Z0000 Encounter for general adult medical examination without abnormal findings: Secondary | ICD-10-CM | POA: Diagnosis not present

## 2022-06-03 LAB — POCT URINALYSIS DIPSTICK
Glucose, UA: NEGATIVE
Leukocytes, UA: NEGATIVE
Spec Grav, UA: 1.01 (ref 1.010–1.025)
Urobilinogen, UA: 0.2 E.U./dL
pH, UA: 6.5 (ref 5.0–8.0)

## 2022-06-03 LAB — CBC WITH DIFFERENTIAL/PLATELET
Basophils Absolute: 0.1 10*3/uL (ref 0.0–0.1)
Basophils Relative: 1 % (ref 0.0–3.0)
Eosinophils Absolute: 0.1 10*3/uL (ref 0.0–0.7)
Eosinophils Relative: 2.5 % (ref 0.0–5.0)
HCT: 35.6 % — ABNORMAL LOW (ref 36.0–46.0)
Hemoglobin: 11.8 g/dL — ABNORMAL LOW (ref 12.0–15.0)
Lymphocytes Relative: 32.8 % (ref 12.0–46.0)
Lymphs Abs: 1.9 10*3/uL (ref 0.7–4.0)
MCHC: 33.2 g/dL (ref 30.0–36.0)
MCV: 91.6 fl (ref 78.0–100.0)
Monocytes Absolute: 0.4 10*3/uL (ref 0.1–1.0)
Monocytes Relative: 7.4 % (ref 3.0–12.0)
Neutro Abs: 3.2 10*3/uL (ref 1.4–7.7)
Neutrophils Relative %: 56.3 % (ref 43.0–77.0)
Platelets: 267 10*3/uL (ref 150.0–400.0)
RBC: 3.89 Mil/uL (ref 3.87–5.11)
RDW: 13.5 % (ref 11.5–15.5)
WBC: 5.7 10*3/uL (ref 4.0–10.5)

## 2022-06-03 LAB — HEPATIC FUNCTION PANEL
ALT: 19 U/L (ref 0–35)
AST: 20 U/L (ref 0–37)
Albumin: 4.1 g/dL (ref 3.5–5.2)
Alkaline Phosphatase: 60 U/L (ref 39–117)
Bilirubin, Direct: 0.1 mg/dL (ref 0.0–0.3)
Total Bilirubin: 0.5 mg/dL (ref 0.2–1.2)
Total Protein: 7 g/dL (ref 6.0–8.3)

## 2022-06-03 LAB — LIPID PANEL
Cholesterol: 151 mg/dL (ref 0–200)
HDL: 48.2 mg/dL (ref >39.00)
LDL Cholesterol: 91 mg/dL (ref 0–99)
NonHDL: 102.33
Total CHOL/HDL Ratio: 3
Triglycerides: 55 mg/dL (ref 0.0–149.0)
VLDL: 11 mg/dL (ref 0.0–40.0)

## 2022-06-03 LAB — BASIC METABOLIC PANEL
BUN: 18 mg/dL (ref 6–23)
CO2: 28 mEq/L (ref 19–32)
Calcium: 9.2 mg/dL (ref 8.4–10.5)
Chloride: 105 mEq/L (ref 96–112)
Creatinine, Ser: 1.02 mg/dL (ref 0.40–1.20)
GFR: 53.05 mL/min — ABNORMAL LOW (ref >60.00)
Glucose, Bld: 91 mg/dL (ref 70–99)
Potassium: 4.1 mEq/L (ref 3.5–5.1)
Sodium: 140 mEq/L (ref 135–145)

## 2022-06-03 LAB — VITAMIN D 25 HYDROXY (VIT D DEFICIENCY, FRACTURES): VITD: 24.37 ng/mL — ABNORMAL LOW (ref 30.00–100.00)

## 2022-06-03 LAB — TSH: TSH: 1.02 u[IU]/mL (ref 0.35–5.50)

## 2022-06-03 MED ORDER — KETOCONAZOLE 2 % EX CREA: 1.0000 | TOPICAL_CREAM | Freq: Every day | CUTANEOUS | 1 refills | Status: DC

## 2022-06-03 MED ORDER — VITAMIN D (ERGOCALCIFEROL) 1.25 MG (50000 UNIT) PO CAPS: 50000.0000 [IU] | ORAL_CAPSULE | ORAL | 12 refills | Status: DC

## 2022-06-03 NOTE — Progress Notes (Signed)
Informed pt of lab results . Vitamin D 50,000 sent to pharmacy

## 2022-06-03 NOTE — Progress Notes (Signed)
   Subjective:    [name], female    DOB: 13-Nov-1945, 76 y.o.   MRN: [id number]  HPI CPE- 'i just don't feel good'.  UTD on mammo.  Following w/ Dr Benson Norway  Patient Care Team    Relationship Specialty Notifications Start End  Midge Minium, MD PCP - General Family Medicine  03/27/14   Elayne Snare, MD Consulting Physician Endocrinology  05/13/16   Carol Ada, MD Consulting Physician Gastroenterology  05/13/16   Madelin Rear, [facility] Pharmacist Pharmacist  07/15/20    Comment: [phone]    Health Maintenance  Topic Date Due   DEXA SCAN  Never done   COVID-19 Vaccine (3 - Moderna series) 09/01/2022 (Originally 05/25/2020)   HPV VACCINES  Aged Out   Pneumonia Vaccine 64+ Years old  Discontinued   COLONOSCOPY (Pts 45-38yr Insurance coverage will need to be confirmed)  Discontinued   TETANUS/TDAP  Discontinued   Hepatitis C Screening  Discontinued   Zoster Vaccines- Shingrix  Discontinued      Review of Systems Patient reports no hearing changes, adenopathy,fever, weight change,  persistant/recurrent hoarseness , swallowing issues, chest pain, palpitations, edema, persistant/recurrent cough, hemoptysis, dyspnea (rest/exertional/paroxysmal nocturnal), gastrointestinal bleeding (melena, rectal bleeding), abdominal pain, significant heartburn, GU symptoms (dysuria, hematuria, incontinence), Gyn symptoms (abnormal  bleeding, pain),  syncope, focal weakness, numbness & tingling, skin/hair/nail changes, abnormal bruising or bleeding, anxiety, or depression.   + constipation and small caliber stool + vision problems, 'it's terrible'    Objective:   Physical Exam General Appearance:    Alert, cooperative, no distress, appears stated age  Head:    Normocephalic, without obvious abnormality, atraumatic  Eyes:    PERRL, conjunctiva/corneas clear, EOM's intact both eyes  Ears:    Normal TM's and external ear canals, both ears  Nose:   Nares normal, septum midline, mucosa  normal, no drainage    or sinus tenderness  Throat:   Lips, mucosa, and tongue normal; teeth and gums normal  Neck:   Supple, symmetrical, trachea midline, no adenopathy;    Thyroid: no enlargement/tenderness/nodules  Back:     Symmetric, no curvature, ROM normal, no CVA tenderness  Lungs:     Clear to auscultation bilaterally, respirations unlabored  Chest Wall:    No tenderness or deformity   Heart:    Reg rate but irregular rhythm, S1 and S2 normal, no murmur, rub or gallop  Breast Exam:    Deferred  Abdomen:     Soft, non-tender, bowel sounds active all four quadrants,    no masses, no organomegaly  Genitalia:    Deferred  Rectal:    Extremities:   Extremities normal, atraumatic, no cyanosis or edema  Pulses:   2+ and symmetric all extremities  Skin:   Skin color, texture, turgor normal, white spots on trunk consistent w/ tinea versicolor  Lymph nodes:   Cervical, supraclavicular, and axillary nodes normal  Neurologic:   CNII-XII intact, normal strength, sensation and reflexes    throughout          Assessment & Plan:

## 2022-06-03 NOTE — Patient Instructions (Signed)
Follow up in 6 months to recheck BP We'll notify you of your lab results and make any changes if needed We'll call you to schedule your cardiology appt for the dizzy spells you've been having Make sure you are drinking LOTS of water and changing positions slowly Apply the Ketoconazole Cream daily on the white spots Call with any questions or concerns Hang in there!

## 2022-06-05 DIAGNOSIS — I499 Cardiac arrhythmia, unspecified: Secondary | ICD-10-CM | POA: Insufficient documentation

## 2022-06-05 NOTE — Assessment & Plan Note (Signed)
Chronic problem.  Adequate control given her age.  On Amlodipine '5mg'$  daily.  No med changes at this time

## 2022-06-05 NOTE — Assessment & Plan Note (Signed)
Check labs and replete prn. 

## 2022-06-05 NOTE — Assessment & Plan Note (Signed)
New.  EKG w/ premature beats.  Would typically not be concerned but pt reports she will episodic dizzy- almost presyncopal- spells.  Will refer to Cards for complete evaluation.  Pt expressed understanding and is in agreement w/ plan.

## 2022-06-05 NOTE — Assessment & Plan Note (Signed)
Pt's PE WNL w/ exception of irregular HR.  UTD on mammo.  Due for GI f/u- referral has been made.  Pt allergic to flu shot.  Check labs.  Anticipatory guidance provided.

## 2022-06-10 ENCOUNTER — Telehealth: Payer: Self-pay

## 2022-06-10 NOTE — Telephone Encounter (Signed)
Spoke to the pt and advised we referred her to Dr Benson Norway on 04/30/22. I advised her that she will need to call them to see if they received her referral

## 2022-06-10 NOTE — Telephone Encounter (Signed)
The xrays that they did of her abdomen at the ER/[facility] showed 'moderate stool burden'.  If she is constipated, she could be leaking liquid stool around a harder stool ball.  Would encouraged daily Miralax until she is able to see GI.  Her weight loss is due to the fact she is not eating.  She needs to make an effort to eat regularly.  If sxs don't improve on daily Miralax, we can consider imaging at that time.

## 2022-06-10 NOTE — Telephone Encounter (Signed)
Patient is calling about GI referral

## 2022-06-10 NOTE — Telephone Encounter (Signed)
Referral was placed to Dr Benson Norway on 04/30/22

## 2022-06-11 ENCOUNTER — Telehealth: Payer: Self-pay

## 2022-06-11 DIAGNOSIS — F32A Depression, unspecified: Secondary | ICD-10-CM

## 2022-06-11 MED ORDER — CLONAZEPAM 0.5 MG PO TABS: 0.5000 mg | ORAL_TABLET | Freq: Two times a day (BID) | ORAL | 1 refills | Status: DC | PRN

## 2022-06-11 NOTE — Telephone Encounter (Signed)
Prescription sent to pharmacy.

## 2022-06-15 ENCOUNTER — Telehealth: Payer: Self-pay | Admitting: Family Medicine

## 2022-06-15 NOTE — Telephone Encounter (Signed)
Encourage patient to contact the pharmacy for refills or they can request refills through [facility]  (Please schedule appointment if patient has not been seen in over a year)    WHAT [place] TO: [address] [phone]  MEDICATION NAME & DOSE: clonazapam 0.5 mg  NOTES/COMMENTS FROM PATIENT: looks like ws called in 09/01, but is telling pt they have not rec'd      [place] office please notify patient: It takes 48-72 hours to process rx refill requests Ask patient to call pharmacy to ensure rx is ready before heading there.

## 2022-06-16 NOTE — Telephone Encounter (Signed)
Pt can pick up her Clonazepam at [facility] on [address].  It was sent on 9/1.  If she wants to change her pharmacy in the future, she should let us know when she initially requests it

## 2022-06-21 DIAGNOSIS — R42 Dizziness and giddiness: Secondary | ICD-10-CM | POA: Diagnosis not present

## 2022-06-21 DIAGNOSIS — K5900 Constipation, unspecified: Secondary | ICD-10-CM | POA: Diagnosis not present

## 2022-06-21 DIAGNOSIS — R634 Abnormal weight loss: Secondary | ICD-10-CM | POA: Diagnosis not present

## 2022-06-22 NOTE — Progress Notes (Addendum)
Cardiology Office Note:    Date:  06/23/2022   ID:  Margaret Torres, DOB 1946-03-02, MRN [id number]  PCP:  Midge Minium, MD   [facility] Cardiologist:  Evalina Field, MD     Referring MD: Midge Minium, MD   CC: Dizziness and labile blood pressures  History of Present Illness:    Margaret Torres is a 76 y.o. female with a hx of the following:   HTN Hx of palpitations GERD Hyperthyroidism, Hx of thyroid nodules (Follows Endo) HLD Depression  She was first evaluated by Dr. Audie Box on August 07, 2021 for palpitations.  EKG with her PCP revealed sinus rhythm with PACs and aberrantly conducted complexes, asymptomatic with this.  EKG in the office that day revealed sinus rhythm with PACs with aberrant conduction which were Ashman complexes, RBBB.  History of bronchitis after COVID-19 booster.  Echocardiogram arranged and revealed EF 60 to 65%, no RWMA, grade 1 diastolic dysfunction, severely elevated pulmonary artery systolic pressure, mild dilation of left atrial size, mild MR, all other findings normal.    Most recently, she was seen by her primary care doctor, Dr. Birdie Riddle, on June 03, 2022.  Twelve-lead EKG revealed premature beats, she reported episode at dizziness that almost sounded presyncopal, therefore was referred to cardiology for evaluation.  Today she presents for evaluation of postural dizziness, presyncopal episodes, and irregular heartbeat. Says she is doing fairly well, however has been having frequent episodes of dizziness and feeling lightheaded that started a couple of months ago, occur randomly, and are not associated with movement. Says she's noticed bradycardia during a recent episode (HR in 40's to 50's) and SBP low around 104, typically it ranges from 130's to 160's, admits to labile BP readings. Episodes last 1 minute, then resolve, denies syncope or loss of consciousness. Says her coordination is off, says she is on Klonopin.  Denies chest pain, shortness of breath, palpitations, bleeding, falls, orthopnea, PND, swelling, or claudication. Says she will be having an upcoming colonoscopy next Thursday, and I discussed that her GI's doctor's office should send our office official clearance before I can clear her from a cardiac perspective for a colonoscopy. Denies any other questions or concerns. Stays active by doing yard work and fishing.     Past Medical History:  Diagnosis Date   Allergic rhinitis 07/16/2007   Carbon monoxide exposure    Dysphagia 03/02/2016   Essential hypertension 07/16/2007   First degree hemorrhoids 11/28/2020   Generalized abdominal pain 11/28/2020   GERD (gastroesophageal reflux disease) 10/12/2018   Hemorrhage of rectum and anus 11/28/2020   History of cervical cancer    Hyperlipidemia 06/26/2007   Hyperthyroidism 04/21/2016   Insomnia 06/20/2014   Left anterior knee pain 09/30/2014   Leg pain, bilateral 03/02/2016   Major depressive disorder 07/16/2007   Mild cognitive impairment with memory loss 05/14/2022   Multiple thyroid nodules 12/30/2017   Olecranon bursitis of left elbow 01/30/2016   Primary osteoarthritis of both knees 10/20/2018   Second degree hemorrhoids 11/28/2020   Vitamin D deficiency 03/19/2022   Wheezing 11/17/2020    Past Surgical History:  Procedure Laterality Date   ABDOMINAL HYSTERECTOMY     WISDOM TOOTH EXTRACTION Bilateral     Current Medications: Current Meds  Medication Sig   amLODipine (NORVASC) 5 MG tablet Take 1 tablet (5 mg total) by mouth daily.   clonazePAM (KLONOPIN) 0.5 MG tablet Take 1 tablet (0.5 mg total) by mouth 2 (two) times daily as needed.  fluticasone (FLONASE) 50 MCG/ACT nasal spray Use 2 spray(s) in each nostril once daily   loratadine (CLARITIN) 10 MG tablet Take 1 tablet (10 mg total) by mouth daily as needed for allergies. Take 10 mg by mouth daily as needed for allergies.   olopatadine (PATADAY) 0.1 % ophthalmic solution 1  drop 2 (two) times daily.   polyethylene glycol powder (GLYCOLAX/MIRALAX) 17 GM/SCOOP powder Take 17 g by mouth 2 (two) times daily as needed.   [DISCONTINUED] meclizine (ANTIVERT) 25 MG tablet TAKE 1 TABLET BY MOUTH THREE TIMES DAILY AS NEEDED FOR DIZZINESS     Allergies:   Influenza vaccines   Social History   Socioeconomic History   Marital status: Divorced    Spouse name: Not on file   Number of children: 3   Years of education: 13   Highest education level: Some college, no degree  Occupational History   Occupation: Retired     Comment: sales/marketing  Tobacco Use   Smoking status: Never   Smokeless tobacco: Never  Substance and Sexual Activity   Alcohol use: No   Drug use: No   Sexual activity: Not Currently    Birth control/protection: Post-menopausal  Other Topics Concern   Not on file  Social History Narrative   Hobbies: enjoys fishing    1 Daughter that lives locally    1 Son lives in [place]    3 [place]    1 Son deceased    Social Determinants of Health   Financial Resource Strain: [place]  (03/25/2022)   Overall Financial Resource Strain (CARDIA)    Difficulty of Paying Living Expenses: Not hard at all  Food Insecurity: No Food Insecurity (03/25/2022)   Hunger Vital Sign    Worried About Running Out of Food in the Last Year: Never true    [place] in the Last Year: Never true  Transportation Needs: No Transportation Needs (03/25/2022)   PRAPARE - Hydrologist (Medical): No    Lack of Transportation (Non-Medical): No  Physical Activity: Insufficiently Active (03/25/2022)   Exercise Vital Sign    Days of Exercise per Week: 3 days    Minutes of Exercise per Session: 30 min  Stress: No Stress Concern Present (03/25/2022)   [place]    Feeling of Stress : Not at all  Social Connections: Socially Isolated (03/25/2022)   Social Connection and Isolation  Panel [NHANES]    Frequency of Communication with Friends and Family: Three times a week    Frequency of Social Gatherings with Friends and Family: Three times a week    Attends Religious Services: Never    Active Member of Clubs or Organizations: No    Attends Music therapist: Never    Marital Status: Divorced     Family History: The patient's family history includes Autism spectrum disorder in an other family member; Breast cancer in her sister; Cancer in her sister and another family member; Cervical cancer in her mother; Diabetes in her brother and sister; Heart attack in her son; Heart disease in her brother; Hypertension in her father and mother; Stroke in her maternal grandmother and sister. There is no history of Thyroid disease.  ROS:   Review of Systems  Constitutional: Negative.   HENT: Negative.    Eyes: Negative.   Respiratory: Negative.    Cardiovascular: Negative.   Gastrointestinal: Negative.   Genitourinary:  Positive for frequency. Negative for  dysuria, flank pain, hematuria and urgency.  Skin: Negative.   Neurological:  Positive for dizziness. Negative for tingling, tremors, sensory change, speech change, focal weakness, seizures, loss of consciousness, weakness and headaches.  Endo/Heme/Allergies:  Positive for environmental allergies. Negative for polydipsia. Does not bruise/bleed easily.  Psychiatric/Behavioral:  Negative for depression, hallucinations, memory loss, substance abuse and suicidal ideas. The patient is nervous/anxious. The patient does not have insomnia.     Please see the history of present illness.    All other systems reviewed and are negative.  EKGs/Labs/Other Studies Reviewed:    The following studies were reviewed today:   EKG:  EKG is ordered today. The ekg ordered today demonstrates SR, 69 bpm, with SA occasional PVC's, RBBB, and left anterior fascicular block.   2D echocardiogram on October 20, 2021: 1. Left ventricular  ejection fraction, by estimation, is 60 to 65%. The  left ventricle has normal function. The left ventricle has no regional  wall motion abnormalities. Left ventricular diastolic parameters are  consistent with Grade I diastolic  dysfunction (impaired relaxation).   2. Right ventricular systolic function is normal. The right ventricular  size is normal. There is severely elevated pulmonary artery systolic  pressure.   3. Left atrial size was mildly dilated.   4. The mitral valve is normal in structure. Mild mitral valve  regurgitation. No evidence of mitral stenosis.   5. The aortic valve is normal in structure. Aortic valve regurgitation is  not visualized. No aortic stenosis is present.   6. The inferior vena cava is normal in size with greater than 50%  respiratory variability, suggesting right atrial pressure of 3 mmHg.    Recent Labs: 06/03/2022: ALT 19; BUN 18; Creatinine, Ser 1.02; Hemoglobin 11.8; Platelets 267.0; Potassium 4.1; Sodium 140; TSH 1.02  Recent Lipid Panel    Component Value Date/Time   CHOL 151 06/03/2022 0958   TRIG 55.0 06/03/2022 0958   HDL 48.20 06/03/2022 0958   CHOLHDL 3 06/03/2022 0958   VLDL 11.0 06/03/2022 0958   LDLCALC 91 06/03/2022 0958     Risk Assessment/Calculations:    The 10-year ASCVD risk score (Arnett DK, et al., 2019) is: 13.2%   Values used to calculate the score:     Age: 22 years     Sex: Female     Is Non-Hispanic African American: Yes     Diabetic: No     Tobacco smoker: No     Systolic Blood Pressure: 341 mmHg     Is BP treated: Yes     HDL Cholesterol: 48.2 mg/dL     Total Cholesterol: 151 mg/dL   Physical Exam:    VS:  BP 135/82 (BP Location: Left Arm, Patient Position: Sitting, Cuff Size: Normal)   Pulse 69   Ht '5\' 4"'$  (1.626 m)   Wt 151 lb (68.5 kg)   LMP 06/30/2013   SpO2 97%   BMI 25.92 kg/m     Wt Readings from Last 3 Encounters:  06/23/22 151 lb (68.5 kg)  06/03/22 148 lb 8 oz (67.4 kg)  04/30/22 151 lb  2 oz (68.5 kg)     GEN: Well nourished, well developed in no acute distress HEENT: Normal NECK: No JVD; No carotid bruits, pulsatile mass along right carotid artery, appears to be a thyroid nodule LYMPHATICS: No lymphadenopathy CARDIAC: S1/S2, RRR with early beats noted, no murmurs, rubs, gallops; 2+ peripheral pulses throughout, strong and equal bilaterally RESPIRATORY:  Clear and diminished to auscultation without rales, wheezing  or rhonchi  MUSCULOSKELETAL:  No edema; No deformity  SKIN: Warm and dry NEUROLOGIC:  Alert and oriented x 3 PSYCHIATRIC:  Normal affect   ASSESSMENT:    1. Dizziness   2. RBBB   3. Abnormal EKG   4. Hypertension, unspecified type   5. Hyperthyroidism   6. Thyroid nodule   7. Hyperlipidemia, unspecified hyperlipidemia type    PLAN:    In order of problems listed above:  Dizziness, abnormal EKG, RBBB -recent, stable Has been occurring frequently and randomly for the past couple months, not movement related. EKG revealed sinus rhythm with sinus arrhythmia, 69 bpm, occasional PVCs, RBBB, and LAFB, had similar EKG findings in October 2022.   Will arrange 14-day ZIO monitor to rule out any abnormal heart rhythms, as she states she has noticed bradycardia with her symptoms.  We will arrange carotid Doppler and Magnesium and CBC.  TSH and BMET were checked in recently and were unremarkable.  Discussed ED precautions.  2.  Hypertension - chronic, labile Admits to labile BP readings. Reports checking this multiple times a day.  Given BP log and discussed to monitor BP once daily at home at least 2 hours after medications and sitting for 5-10 minutes.   3. Hyperthyroidism, thyroid nodule - chronic, not progressing Does admit to weight loss that is bothering her.  On exam, pulsatile mass that appears to be thyroid nodule located on right lateral side of thyroid.  Recommended close follow-up with endocrinology.  Most recent TSH was normal.  Arranging carotid Doppler  as mentioned above.  4. Hyperlipidemia - chronic, stable LDL from last month was 91.  This is being managed by her PCP and is not on any lipid-lowering medications. Continue to follow with PCP.  Arranging carotid Doppler as mentioned above to rule out carotid artery disease.   5. Disposition: Follow-up with me or Fabian Sharp, PA-C in 6 weeks or sooner if anything changes.   Medication Adjustments/Labs and Tests Ordered: Current medicines are reviewed at length with the patient today.  Concerns regarding medicines are outlined above.  Orders Placed This Encounter  Procedures   Magnesium   CBC   LONG TERM MONITOR (3-14 DAYS)   VAS US CAROTID   No orders of the defined types were placed in this encounter.   Patient Instructions  Medication Instructions:  Your physician recommends that you continue on your current medications as directed. Please refer to the Current Medication list given to you today.  *If you need a refill on your cardiac medications before your next appointment, please call your pharmacy*  Please take your blood pressure daily for 2 weeks and send in a MyChart message. Please include heart rates.   HOW TO TAKE YOUR BLOOD PRESSURE: Rest 5 minutes before taking your blood pressure. Don't smoke or drink caffeinated beverages for at least 30 minutes before. Take your blood pressure before (not after) you eat. Sit comfortably with your back supported and both feet on the floor (don't cross your legs). Elevate your arm to heart level on a table or a desk. Use the proper sized cuff. It should fit smoothly and snugly around your bare upper arm. There should be enough room to slip a fingertip under the cuff. The bottom edge of the cuff should be 1 inch above the crease of the elbow. Ideally, take 3 measurements at one sitting and record the average.    Lab Work: Your physician recommends that you have the following labs drawn today: Magnesium  and CBC  If you have labs  (blood work) drawn today and your tests are completely normal, you will receive your results only by: [place] (if you have MyChart) OR A paper copy in the mail If you have any lab test that is abnormal or we need to change your treatment, we will call you to review the results.   Testing/Procedures: Your physician has requested that you have a carotid duplex. This test is an ultrasound of the carotid arteries in your neck. It looks at blood flow through these arteries that supply the brain with blood. Allow one hour for this exam. There are no restrictions or special instructions.   ZIO XT- Long Term Monitor Instructions  Your physician has requested you wear a ZIO patch monitor for 14 days.  This is a single patch monitor. [employer] supplies one patch monitor per enrollment. Additional stickers are not available. Please do not apply patch if you will be having a Nuclear Stress Test,  Echocardiogram, Cardiac CT, MRI, or Chest Xray during the period you would be wearing the  monitor. The patch cannot be worn during these tests. You cannot remove and re-apply the  ZIO XT patch monitor.  Your ZIO patch monitor will be mailed 3 day USPS to your address on file. It may take 3-5 days  to receive your monitor after you have been enrolled.  Once you have received your monitor, please review the enclosed instructions. Your monitor  has already been registered assigning a specific monitor serial # to you.  Billing and Patient Assistance Program Information  We have supplied [employer] with any of your insurance information on file for billing purposes. [employer] offers a sliding scale Patient Assistance Program for patients that do not have  insurance, or whose insurance does not completely cover the cost of the ZIO monitor.  You must apply for the Patient Assistance Program to qualify for this discounted rate.  To apply, please call [employer] at [phone], select option 4, select option 2, ask  to apply for  Patient Assistance Program. Theodore Demark will ask your household income, and how many people  are in your household. They will quote your out-of-pocket cost based on that information.  [employer] will also be able to set up a 52-month interest-free payment plan if needed.  Applying the monitor Shave hair from upper left chest.  Hold abrader disc by orange tab. Rub abrader in 40 strokes over the upper left chest as  indicated in your monitor instructions.  Clean area with 4 enclosed alcohol pads. Let dry.  Apply patch as indicated in monitor instructions. Patch will be placed under collarbone on left  side of chest with arrow pointing upward.  Rub patch adhesive wings for 2 minutes. Remove white label marked "1". Remove the white  label marked "2". Rub patch adhesive wings for 2 additional minutes.  While looking in a mirror, press and release button in center of patch. A small green light will  flash 3-4 times. This will be your only indicator that the monitor has been turned on.  Do not shower for the first 24 hours. You may shower after the first 24 hours.  Press the button if you feel a symptom. You will hear a small click. Record Date, Time and  Symptom in the Patient Logbook.  When you are ready to remove the patch, follow instructions on the last 2 pages of Patient  Logbook. Stick patch monitor onto the last page of Patient Logbook.  Place Patient Logbook in the blue and white box. Use locking tab on box and tape box closed  securely. The blue and white box has prepaid postage on it. Please place it in the mailbox as  soon as possible. Your physician should have your test results approximately 7 days after the  monitor has been mailed back to [facility].  Call [place] at [phone] if you have questions regarding  your ZIO XT patch monitor. Call them immediately if you see an orange light blinking on your  monitor.  If your monitor falls off in  less than 4 days, contact our Monitor department at [phone].  If your monitor becomes loose or falls off after 4 days call [employer] at [phone] for  suggestions on securing your monitor    Follow-Up: At [facility], you and your health needs are our priority.  As part of our continuing mission to provide you with exceptional heart care, we have created designated Provider Care Teams.  These Care Teams include your primary Cardiologist (physician) and Advanced Practice Providers (APPs -  Physician Assistants and Nurse Practitioners) who all work together to provide you with the care you need, when you need it.  We recommend signing up for the patient portal called "MyChart".  Sign up information is provided on this After Visit Summary.  MyChart is used to connect with patients for Virtual Visits (Telemedicine).  Patients are able to view lab/test results, encounter notes, upcoming appointments, etc.  Non-urgent messages can be sent to your provider as well.   To learn more about what you can do with MyChart, go to [email or web address].    Your next appointment:   6 week(s)  The format for your next appointment:   In Person  Provider:   Finis Bud, NP      Signed, Finis Bud, NP  06/23/2022 7:24 PM    [place]

## 2022-06-23 ENCOUNTER — Telehealth: Payer: Self-pay | Admitting: Family Medicine

## 2022-06-23 ENCOUNTER — Encounter: Payer: Self-pay | Admitting: Physician Assistant

## 2022-06-23 ENCOUNTER — Ambulatory Visit: Payer: Medicare HMO | Attending: Nurse Practitioner

## 2022-06-23 ENCOUNTER — Ambulatory Visit: Payer: Medicare HMO | Attending: Physician Assistant | Admitting: Nurse Practitioner

## 2022-06-23 ENCOUNTER — Other Ambulatory Visit: Payer: Self-pay

## 2022-06-23 VITALS — BP 135/82 | HR 69 | Ht 64.0 in | Wt 151.0 lb

## 2022-06-23 DIAGNOSIS — R221 Localized swelling, mass and lump, neck: Secondary | ICD-10-CM

## 2022-06-23 DIAGNOSIS — E785 Hyperlipidemia, unspecified: Secondary | ICD-10-CM | POA: Diagnosis not present

## 2022-06-23 DIAGNOSIS — I4510 Unspecified right bundle-branch block: Secondary | ICD-10-CM | POA: Diagnosis not present

## 2022-06-23 DIAGNOSIS — I10 Essential (primary) hypertension: Secondary | ICD-10-CM | POA: Diagnosis not present

## 2022-06-23 DIAGNOSIS — R9431 Abnormal electrocardiogram [ECG] [EKG]: Secondary | ICD-10-CM

## 2022-06-23 DIAGNOSIS — R42 Dizziness and giddiness: Secondary | ICD-10-CM

## 2022-06-23 DIAGNOSIS — E041 Nontoxic single thyroid nodule: Secondary | ICD-10-CM | POA: Diagnosis not present

## 2022-06-23 DIAGNOSIS — E0590 Thyrotoxicosis, unspecified without thyrotoxic crisis or storm: Secondary | ICD-10-CM | POA: Diagnosis not present

## 2022-06-23 DIAGNOSIS — H8110 Benign paroxysmal vertigo, unspecified ear: Secondary | ICD-10-CM

## 2022-06-23 MED ORDER — MECLIZINE HCL 25 MG PO TABS: ORAL_TABLET | ORAL | 0 refills | Status: DC

## 2022-06-23 NOTE — Patient Instructions (Signed)
Medication Instructions:  Your physician recommends that you continue on your current medications as directed. Please refer to the Current Medication list given to you today.  *If you need a refill on your cardiac medications before your next appointment, please call your pharmacy*  Please take your blood pressure daily for 2 weeks and send in a MyChart message. Please include heart rates.   HOW TO TAKE YOUR BLOOD PRESSURE: Rest 5 minutes before taking your blood pressure. Don't smoke or drink caffeinated beverages for at least 30 minutes before. Take your blood pressure before (not after) you eat. Sit comfortably with your back supported and both feet on the floor (don't cross your legs). Elevate your arm to heart level on a table or a desk. Use the proper sized cuff. It should fit smoothly and snugly around your bare upper arm. There should be enough room to slip a fingertip under the cuff. The bottom edge of the cuff should be 1 inch above the crease of the elbow. Ideally, take 3 measurements at one sitting and record the average.    Lab Work: Your physician recommends that you have the following labs drawn today: Magnesium and CBC  If you have labs (blood work) drawn today and your tests are completely normal, you will receive your results only by: MyChart Message (if you have MyChart) OR A paper copy in the mail If you have any lab test that is abnormal or we need to change your treatment, we will call you to review the results.   Testing/Procedures: Your physician has requested that you have a carotid duplex. This test is an ultrasound of the carotid arteries in your neck. It looks at blood flow through these arteries that supply the brain with blood. Allow one hour for this exam. There are no restrictions or special instructions.   ZIO XT- Long Term Monitor Instructions  Your physician has requested you wear a ZIO patch monitor for 14 days.  This is a single patch monitor.  [employer] supplies one patch monitor per enrollment. Additional stickers are not available. Please do not apply patch if you will be having a Nuclear Stress Test,  Echocardiogram, Cardiac CT, MRI, or Chest Xray during the period you would be wearing the  monitor. The patch cannot be worn during these tests. You cannot remove and re-apply the  ZIO XT patch monitor.  Your ZIO patch monitor will be mailed 3 day USPS to your address on file. It may take 3-5 days  to receive your monitor after you have been enrolled.  Once you have received your monitor, please review the enclosed instructions. Your monitor  has already been registered assigning a specific monitor serial # to you.  Billing and Patient Assistance Program Information  We have supplied [employer] with any of your insurance information on file for billing purposes. [employer] offers a sliding scale Patient Assistance Program for patients that do not have  insurance, or whose insurance does not completely cover the cost of the ZIO monitor.  You must apply for the Patient Assistance Program to qualify for this discounted rate.  To apply, please call [employer] at [phone], select option 4, select option 2, ask to apply for  Patient Assistance Program. Theodore Demark will ask your household income, and how many people  are in your household. They will quote your out-of-pocket cost based on that information.  [employer] will also be able to set up a 12-month interest-free payment plan if needed.  Applying the monitor Shave  hair from upper left chest.  Hold abrader disc by orange tab. Rub abrader in 40 strokes over the upper left chest as  indicated in your monitor instructions.  Clean area with 4 enclosed alcohol pads. Let dry.  Apply patch as indicated in monitor instructions. Patch will be placed under collarbone on left  side of chest with arrow pointing upward.  Rub patch adhesive wings for 2 minutes. Remove white label marked "1". Remove the  white  label marked "2". Rub patch adhesive wings for 2 additional minutes.  While looking in a mirror, press and release button in center of patch. A small green light will  flash 3-4 times. This will be your only indicator that the monitor has been turned on.  Do not shower for the first 24 hours. You may shower after the first 24 hours.  Press the button if you feel a symptom. You will hear a small click. Record Date, Time and  Symptom in the Patient Logbook.  When you are ready to remove the patch, follow instructions on the last 2 pages of Patient  Logbook. Stick patch monitor onto the last page of Patient Logbook.  Place Patient Logbook in the blue and white box. Use locking tab on box and tape box closed  securely. The blue and white box has prepaid postage on it. Please place it in the mailbox as  soon as possible. Your physician should have your test results approximately 7 days after the  monitor has been mailed back to [facility].  Call [place] at [phone] if you have questions regarding  your ZIO XT patch monitor. Call them immediately if you see an orange light blinking on your  monitor.  If your monitor falls off in less than 4 days, contact our Monitor department at [phone].  If your monitor becomes loose or falls off after 4 days call [employer] at [phone] for  suggestions on securing your monitor    Follow-Up: At [facility], you and your health needs are our priority.  As part of our continuing mission to provide you with exceptional heart care, we have created designated Provider Care Teams.  These Care Teams include your primary Cardiologist (physician) and Advanced Practice Providers (APPs -  Physician Assistants and Nurse Practitioners) who all work together to provide you with the care you need, when you need it.  We recommend signing up for the patient portal called "MyChart".  Sign up information is provided on this  After Visit Summary.  MyChart is used to connect with patients for Virtual Visits (Telemedicine).  Patients are able to view lab/test results, encounter notes, upcoming appointments, etc.  Non-urgent messages can be sent to your provider as well.   To learn more about what you can do with MyChart, go to [email or web address].    Your next appointment:   6 week(s)  The format for your next appointment:   In Person  Provider:   Finis Bud, NP

## 2022-06-23 NOTE — Telephone Encounter (Signed)
Encourage patient to contact the pharmacy for refills or they can request refills through [facility]  (Please schedule appointment if patient has not been seen in over a year)    WHAT [place] TO: [place] [phone]  MEDICATION NAME & DOSE: meclizine 25 mg  NOTES/COMMENTS FROM PATIENT:      [place] office please notify patient: It takes 48-72 hours to process rx refill requests Ask patient to call pharmacy to ensure rx is ready before heading there.

## 2022-06-23 NOTE — Progress Notes (Unsigned)
Enrolled for [employer] to mail a ZIO XT long term holter monitor to the patients address on file.   Dr. O'Neal to read.  

## 2022-06-24 ENCOUNTER — Telehealth: Payer: Self-pay | Admitting: Family Medicine

## 2022-06-24 LAB — CBC
Hematocrit: 34.5 % (ref 34.0–46.6)
Hemoglobin: 11.4 g/dL (ref 11.1–15.9)
MCH: 29.8 pg (ref 26.6–33.0)
MCHC: 33 g/dL (ref 31.5–35.7)
MCV: 90 fL (ref 79–97)
Platelets: 283 10*3/uL (ref 150–450)
RBC: 3.83 x10E6/uL (ref 3.77–5.28)
RDW: 13.3 % (ref 11.7–15.4)
WBC: 7.2 10*3/uL (ref 3.4–10.8)

## 2022-06-24 LAB — MAGNESIUM: Magnesium: 2.2 mg/dL (ref 1.6–2.3)

## 2022-06-24 NOTE — Telephone Encounter (Signed)
Pt can cancel the other GI appt unless she feels like she needs a 2nd opinion.  Seeing Dr Benson Norway should be enough

## 2022-06-24 NOTE — Telephone Encounter (Signed)
Called VM full unable to LM

## 2022-06-24 NOTE — Telephone Encounter (Signed)
Patient states she went to see Dr. Benson Norway and now she has an appointment for Dr. Coralyn Mark at [place] on 9/27. Patient states she is confused on what if she should keep the appointment or cancelled

## 2022-06-25 ENCOUNTER — Other Ambulatory Visit (INDEPENDENT_AMBULATORY_CARE_PROVIDER_SITE_OTHER): Payer: Medicare HMO

## 2022-06-25 DIAGNOSIS — I10 Essential (primary) hypertension: Secondary | ICD-10-CM | POA: Diagnosis not present

## 2022-06-28 ENCOUNTER — Other Ambulatory Visit: Payer: Self-pay

## 2022-06-28 DIAGNOSIS — H8110 Benign paroxysmal vertigo, unspecified ear: Secondary | ICD-10-CM

## 2022-06-28 MED ORDER — MECLIZINE HCL 25 MG PO TABS: ORAL_TABLET | ORAL | 0 refills | Status: DC

## 2022-07-01 DIAGNOSIS — K2960 Other gastritis without bleeding: Secondary | ICD-10-CM | POA: Diagnosis not present

## 2022-07-01 DIAGNOSIS — K3189 Other diseases of stomach and duodenum: Secondary | ICD-10-CM | POA: Diagnosis not present

## 2022-07-01 DIAGNOSIS — D509 Iron deficiency anemia, unspecified: Secondary | ICD-10-CM | POA: Diagnosis not present

## 2022-07-01 DIAGNOSIS — B9681 Helicobacter pylori [H. pylori] as the cause of diseases classified elsewhere: Secondary | ICD-10-CM | POA: Diagnosis not present

## 2022-07-01 DIAGNOSIS — K5730 Diverticulosis of large intestine without perforation or abscess without bleeding: Secondary | ICD-10-CM | POA: Diagnosis not present

## 2022-07-01 DIAGNOSIS — K635 Polyp of colon: Secondary | ICD-10-CM | POA: Diagnosis not present

## 2022-07-01 DIAGNOSIS — K2970 Gastritis, unspecified, without bleeding: Secondary | ICD-10-CM | POA: Diagnosis not present

## 2022-07-01 DIAGNOSIS — K921 Melena: Secondary | ICD-10-CM | POA: Diagnosis not present

## 2022-07-01 DIAGNOSIS — D122 Benign neoplasm of ascending colon: Secondary | ICD-10-CM | POA: Diagnosis not present

## 2022-07-05 ENCOUNTER — Encounter: Payer: Medicare HMO | Admitting: Psychology

## 2022-07-05 ENCOUNTER — Encounter (HOSPITAL_COMMUNITY): Payer: Medicare HMO

## 2022-07-07 ENCOUNTER — Ambulatory Visit: Payer: Medicare HMO | Admitting: Internal Medicine

## 2022-07-09 ENCOUNTER — Ambulatory Visit (HOSPITAL_COMMUNITY)
Admission: RE | Admit: 2022-07-09 | Discharge: 2022-07-09 | Disposition: A | Discharge status: Home / Self Care | Payer: Medicare HMO | Source: Ambulatory Visit | Attending: Cardiovascular Disease | Admitting: Cardiovascular Disease

## 2022-07-09 DIAGNOSIS — R42 Dizziness and giddiness: Secondary | ICD-10-CM | POA: Insufficient documentation

## 2022-07-12 ENCOUNTER — Encounter: Payer: Medicare HMO | Admitting: Psychology

## 2022-08-05 ENCOUNTER — Ambulatory Visit (INDEPENDENT_AMBULATORY_CARE_PROVIDER_SITE_OTHER): Payer: Medicare HMO | Admitting: Family Medicine

## 2022-08-05 ENCOUNTER — Encounter: Payer: Self-pay | Admitting: Family Medicine

## 2022-08-05 VITALS — BP 110/70 | HR 71 | Temp 98.6°F | Resp 16 | Ht 64.0 in | Wt 148.5 lb

## 2022-08-05 DIAGNOSIS — B86 Scabies: Secondary | ICD-10-CM | POA: Diagnosis not present

## 2022-08-05 MED ORDER — PERMETHRIN 5 % EX CREA: TOPICAL_CREAM | CUTANEOUS | 0 refills | Status: DC

## 2022-08-05 NOTE — Progress Notes (Signed)
   Subjective:    [name], female    DOB: 02/13/1946, 76 y.o.   MRN: [id number]  HPI Abnormal skin sensation- pt reports sxs started 2-3 weeks ago.  Pt reports she feels 'like something's crawling on me'.  Denies bites.  'they get under my skin'.  Feels that she has mites.  Pt has been sleeping on her pullout couch and notes that the foam cushions were 'eaten'.  'these things can fly'.   Review of Systems For ROS see HPI     Objective:   Physical Exam Vitals reviewed.  Constitutional:      General: She is not in acute distress.    Appearance: Normal appearance. She is not ill-appearing.  Skin:    General: Skin is warm and dry.     Findings: No erythema or rash.  Neurological:     General: No focal deficit present.     Mental Status: She is alert.  Psychiatric:     Comments: Anxious, perseverating on presence of bugs under her skin           Assessment & Plan:  Scabies- new.  No obvious rash or bites present but given the itching and the concern for mites, will treat w/ Permethrin cream.  Encouraged her to wash all sheets and towels and to avoid sitting on upholstered furniture for at least 3 days.  If she's worried about an infestation in her couch discussed that she needs to call pest control to evaluate.  Pt expressed understanding and is in agreement w/ plan.

## 2022-08-05 NOTE — Progress Notes (Signed)
Cardiology Office Note:    Date:  08/07/2022   ID:  Margaret Torres, DOB 1946/05/04, MRN [id number]  PCP:  Midge Minium, MD   [place] Prov[facility] Cardiologist:  Evalina Field, MD     Referring MD: Midge Minium, MD   CC: Here for follow up of dizziness  History of Present Illness:    Margaret Torres is a 76 y.o. female with a hx of the following:   HTN Hx of palpitations GERD Hyperthyroidism, Hx of thyroid nodules (Follows Endo) HLD Depression Mild cognitive impairment with memory loss  She was first evaluated by Dr. Audie Box on August 07, 2021 for palpitations.  EKG with her PCP revealed sinus rhythm with PACs and aberrantly conducted complexes, asymptomatic with this.  EKG in the office that day revealed sinus rhythm with PACs with aberrant conduction which were Ashman complexes, RBBB.  History of bronchitis after COVID-19 booster.  Echocardiogram arranged and revealed EF 60 to 65%, no RWMA, grade 1 diastolic dysfunction, severely elevated pulmonary artery systolic pressure, mild dilation of left atrial size, mild MR, all other findings normal.    Saw her primary care doctor, Dr. Birdie Riddle, on June 03, 2022.  Twelve-lead EKG revealed premature beats, she reported episode at dizziness that almost sounded presyncopal, therefore was referred to cardiology for evaluation.  I last saw her on 06/23/2022 for CC of postural dizziness, presyncopal episodes, and irregular heartbeat. Reported having frequent episodes of dizziness and feeling lightheaded that started a couple of months ago, occur randomly, not associated with movement. Noticed bradycardia during a recent episode (HR in 40's to 50's) and SBP low normal around 104, typically it ranges from 130's to 160's, admitted to labile BP readings. Episodes lasted 1 minute, then resolved, denies syncope or loss of consciousness. Coordination off, on Klonopin. Denied chest pain, shortness of breath, palpitations,  bleeding, falls, orthopnea, PND, swelling, or claudication. Said she would be having an upcoming colonoscopy the followingThursday, and I discussed that her GI's doctor's office should send our office official clearance before I can clear her from a cardiac perspective for a colonoscopy. Stayed active by doing yard work and fishing.  Was unable to perform 14-day monitor due to cost.  Carotid Doppler was normal.   Today she presents for follow-up.  Difficulty obtaining HPI as patient is a poor historian as related to evident mild cognitive impairment with memory loss, was distracted throughout interview.  Dizziness has improved from last visit.  She says some times is related to vertigo but not always.  She takes meclizine as needed for her history of vertigo and this seems to help.  Had a recent colonoscopy that showed some H. pylori, says she has received treatment for this.  Very active and very independent is 76 years old.  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 Denies any chest pain, shortness of breath, palpitations, syncope, presyncope, orthopnea, PND, swelling, bleeding, or claudication.    Past Medical History:  Diagnosis Date   Allergic rhinitis 07/16/2007   Carbon monoxide exposure    Dizziness    Dysphagia 03/02/2016   Essential hypertension 07/16/2007   First degree hemorrhoids 11/28/2020   Generalized abdominal pain 11/28/2020   GERD (gastroesophageal reflux disease) 10/12/2018   Hemorrhage of rectum and anus 11/28/2020   History of cervical cancer    HTN (hypertension)    Hyperlipidemia 06/26/2007   Hyperthyroidism 04/21/2016   hx of thyroid nodule   Insomnia 06/20/2014   Left  anterior knee pain 09/30/2014   Leg pain, bilateral 03/02/2016   Major depressive disorder 07/16/2007   Mild cognitive impairment with memory loss 05/14/2022   Multiple thyroid nodules 12/30/2017   Olecranon bursitis of left elbow 01/30/2016   Primary osteoarthritis of both  knees 10/20/2018   RBBB    Second degree hemorrhoids 11/28/2020   Vitamin D deficiency 03/19/2022   Wheezing 11/17/2020    Past Surgical History:  Procedure Laterality Date   ABDOMINAL HYSTERECTOMY     WISDOM TOOTH EXTRACTION Bilateral     Current Medications: Current Meds  Medication Sig   Cholecalciferol (VITAMIN D3) 10 MCG (400 UNIT) CAPS Take by mouth.   CLENPIQ 10-3.5-12 MG-GM -GM/175ML SOLN Take by mouth.   clonazePAM (KLONOPIN) 0.5 MG tablet Take 1 tablet (0.5 mg total) by mouth 2 (two) times daily as needed.   fluticasone (FLONASE) 50 MCG/ACT nasal spray Use 2 spray(s) in each nostril once daily   loratadine (CLARITIN) 10 MG tablet Take 1 tablet (10 mg total) by mouth daily as needed for allergies. Take 10 mg by mouth daily as needed for allergies.   meclizine (ANTIVERT) 25 MG tablet TAKE 1 TABLET BY MOUTH THREE TIMES DAILY AS NEEDED FOR DIZZINESS   olopatadine (PATADAY) 0.1 % ophthalmic solution 1 drop 2 (two) times daily.   permethrin (ELIMITE) 5 % cream Apply from your chin down and sleep in the cream overnight.  Then wash off in the morning.  Wash all sheets and towels   polyethylene glycol powder (GLYCOLAX/MIRALAX) 17 GM/SCOOP powder Take 17 g by mouth 2 (two) times daily as needed.   sucralfate (CARAFATE) 1 g tablet Take 1 tablet (1 g total) by mouth 3 (three) times daily with meals.   amLODipine (NORVASC) 5 MG tablet Take 1 tablet (5 mg total) by mouth daily.     Allergies:   Pneumococcal vaccine and Influenza vaccines   Social History   Socioeconomic History   Marital status: Divorced    Spouse name: Not on file   Number of children: 3   Years of education: 13   Highest education level: Some college, no degree  Occupational History   Occupation: Retired     Comment: sales/marketing  Tobacco Use   Smoking status: Never   Smokeless tobacco: Never  Substance and Sexual Activity   Alcohol use: No   Drug use: No   Sexual activity: Not Currently    Birth  control/protection: Post-menopausal  Other Topics Concern   Not on file  Social History Narrative   Hobbies: enjoys fishing    1 Daughter that lives locally    1 Son lives in [place]    3 [place]    1 Son deceased    Social Determinants of Health   Financial Resource Strain: [place]  (03/25/2022)   Overall Financial Resource Strain (CARDIA)    Difficulty of Paying Living Expenses: Not hard at all  Food Insecurity: No Food Insecurity (03/25/2022)   Hunger Vital Sign    Worried About Running Out of Food in the Last Year: Never true    [place] in the Last Year: Never true  Transportation Needs: No Transportation Needs (03/25/2022)   PRAPARE - Hydrologist (Medical): No    Lack of Transportation (Non-Medical): No  Physical Activity: Insufficiently Active (03/25/2022)   Exercise Vital Sign    Days of Exercise per Week: 3 days    Minutes of Exercise per Session: 30 min  Stress: No Stress Concern Present (03/25/2022)   [place]    Feeling of Stress : Not at all  Social Connections: Socially Isolated (03/25/2022)   Social Connection and Isolation Panel [NHANES]    Frequency of Communication with Friends and Family: Three times a week    Frequency of Social Gatherings with Friends and Family: Three times a week    Attends Religious Services: Never    Active Member of Clubs or Organizations: No    Attends Music therapist: Never    Marital Status: Divorced     Family History: The patient's family history includes Autism spectrum disorder in an other family member; Breast cancer in her sister; Cancer in her sister and another family member; Cervical cancer in her mother; Diabetes in her brother and sister; Heart attack in her son; Heart disease in her brother; Hypertension in her father and mother; Stroke in her maternal grandmother and sister. There is no history of Thyroid  disease.  ROS:   Review of Systems  Constitutional: Negative.   HENT: Negative.    Eyes: Negative.   Respiratory: Negative.    Cardiovascular: Negative.   Gastrointestinal: Negative.   Genitourinary: Negative.   Musculoskeletal: Negative.   Skin: Negative.   Neurological:  Positive for dizziness. Negative for tingling, tremors, sensory change, speech change, focal weakness, seizures, loss of consciousness, weakness and headaches.       See HPI.  Endo/Heme/Allergies: Negative.   Psychiatric/Behavioral:  Positive for memory loss. Negative for depression, hallucinations, substance abuse and suicidal ideas. The patient is nervous/anxious. The patient does not have insomnia.        See HPI.     Please see the history of present illness.    All other systems reviewed and are negative.  EKGs/Labs/Other Studies Reviewed:    The following studies were reviewed today:   EKG:  EKG is not ordered today.   Carotid duplex bilateral on 07/09/2022: Right Carotid: There is no evidence of stenosis in the right ICA. The extracranial vessels were near-normal with only minimal wall thickening or plaque.  Left Carotid: There is no evidence of stenosis in the left ICA. The  extracranial vessels were near-normal with only minimal wall thickening  or plaque.  Vertebrals:  Bilateral vertebral arteries demonstrate antegrade flow.  Subclavians: Normal flow hemodynamics were seen in bilateral subclavian arteries.   14-day ZIO monitor on 06/23/22: Not done   2D echocardiogram on October 20, 2021: 1. Left ventricular ejection fraction, by estimation, is 60 to 65%. The  left ventricle has normal function. The left ventricle has no regional  wall motion abnormalities. Left ventricular diastolic parameters are  consistent with Grade I diastolic  dysfunction (impaired relaxation).   2. Right ventricular systolic function is normal. The right ventricular  size is normal. There is severely elevated  pulmonary artery systolic  pressure.   3. Left atrial size was mildly dilated.   4. The mitral valve is normal in structure. Mild mitral valve  regurgitation. No evidence of mitral stenosis.   5. The aortic valve is normal in structure. Aortic valve regurgitation is  not visualized. No aortic stenosis is present.   6. The inferior vena cava is normal in size with greater than 50%  respiratory variability, suggesting right atrial pressure of 3 mmHg.    Recent Labs: 06/03/2022: ALT 19; BUN 18; Creatinine, Ser 1.02; Potassium 4.1; Sodium 140; TSH 1.02 06/23/2022: Hemoglobin 11.4; Magnesium 2.2; Platelets 283  Recent Lipid Panel    Component Value Date/Time   CHOL 151 06/03/2022 0958   TRIG 55.0 06/03/2022 0958   HDL 48.20 06/03/2022 0958   CHOLHDL 3 06/03/2022 0958   VLDL 11.0 06/03/2022 0958   LDLCALC 91 06/03/2022 0958     Risk Assessment/Calculations:    The 10-year ASCVD risk score (Arnett DK, et al., 2019) is: 12.1%   Values used to calculate the score:     Age: 30 years     Sex: Female     Is Non-Hispanic African American: Yes     Diabetic: No     Tobacco smoker: No     Systolic Blood Pressure: 237 mmHg     Is BP treated: Yes     HDL Cholesterol: 48.2 mg/dL     Total Cholesterol: 151 mg/dL   Physical Exam:    VS:  BP 126/76 (BP Location: Left Arm, Patient Position: Sitting, Cuff Size: Normal)   Pulse 87   Ht '5\' 5"'$  (1.651 m)   Wt 148 lb 12.8 oz (67.5 kg)   LMP 06/30/2013   SpO2 96%   BMI 24.76 kg/m     Wt Readings from Last 3 Encounters:  08/06/22 148 lb 12.8 oz (67.5 kg)  08/05/22 148 lb 8 oz (67.4 kg)  06/23/22 151 lb (68.5 kg)    Negative orthostatics today.   Orthostatic Vitals for the past 48 hrs (Last 6 readings):  Patient Position Orthostatic BP Orthostatic Pulse  08/06/22 1314 Sitting -- --  08/06/22 1350 Lying left side 156/84 66  08/06/22 1351 Sitting 154/84 65  08/06/22 1352 Standing 153/88 75  08/06/22 1353 Standing 163/90 78   GEN: Well  nourished, well developed in no acute distress HEENT: Normal NECK: No JVD; No carotid bruits, pulsatile mass along right carotid artery, appears to be a thyroid nodule LYMPHATICS: No lymphadenopathy CARDIAC: S1/S2, RRR with early beats noted, no murmurs, rubs, gallops; 2+ peripheral pulses throughout, strong and equal bilaterally RESPIRATORY:  Clear and diminished to auscultation without rales, wheezing or rhonchi  MUSCULOSKELETAL:  No edema; No deformity  SKIN: Warm and dry NEUROLOGIC:  Alert and oriented x 3, poor historian, forgetfulness and memory issues noted PSYCHIATRIC:  Normal affect   ASSESSMENT:    1. Dizziness   2. Hypertension, unspecified type   3. Hyperthyroidism   4. Thyroid nodule   5. Hyperlipidemia, unspecified hyperlipidemia type     PLAN:    In order of problems listed above:  Dizziness   Poor historian. Orthostatics negative today. Echo in 10/2021 overall normal. Etiology multifactorial, but does not appear cardiac related. Believe Klonopin is contributing as well as longstanding history of vertigo. Continue Meclizine PRN. Will route note to PCP regarding Klonopin as patient stated she is "addicted" to the medication. Carotid doppler normal. Recent labs unremarkable. Continue to follow up with PCP and Neurology, was previously arranged follow up with Sharene Butters, PA-C with neurology. Pt unable to complete previous Zio monitor due to cost.  Discussed ED precautions. Recommended regular meals, staying adequately hydrated, wearing compression stockings, and changing positions slowly. Will update patient's daughter (okay per DPR) regarding instructions as patient has evidence of some memory issues.   2.  Hypertension  BP stable today. Given BP log and discussed to monitor BP once daily at home at least 2 hours after medications and sitting for 5-10 minutes. Because pt states Klonopin helps lower her BP, will make Amlodipine 5 mg daily PRN if SBP > 140. Heart healthy diet  and regular cardiovascular exercise encouraged.   3. Hyperthyroidism, thyroid nodule  Weight is stable from previous visit. Pulsatile mass that appears to be thyroid nodule still located on right lateral side of thyroid.  Recommended close follow-up with endocrinology. Normal TSH. Carotid doppler normal. Will route note to PCP and recommend arranging CT of neck.   4. Hyperlipidemia  LDL 91 from 05/2022.  This is being managed by her PCP and is not on any lipid-lowering medications. Continue to follow with PCP.  Heart healthy diet and regular cardiovascular exercise encouraged.   5. Disposition: Follow-up with Dr. Audie Box or APP in 3-4 months or sooner if anything changes.   Medication Adjustments/Labs and Tests Ordered: Current medicines are reviewed at length with the patient today.  Concerns regarding medicines are outlined above.  No orders of the defined types were placed in this encounter.  Meds ordered this encounter  Medications   amLODipine (NORVASC) 5 MG tablet    Sig: Take 1 tablet (5 mg total) by mouth daily as needed (SBP>140).    Dispense:  90 tablet    Refill:  1    Patient Instructions  Medication Instructions:  CONTINUE TO TAKE AMLODIPINE AS NEEDED FOR BLOOD PRESSURE >140 *If you need a refill on your cardiac medications before your next appointment, please call your pharmacy*  Lab Work: NONE  Testing/Procedures:   Follow-Up: At [facility], you and your health needs are our priority.  As part of our continuing mission to provide you with exceptional heart care, we have created designated Provider Care Teams.  These Care Teams include your primary Cardiologist (physician) and Advanced Practice Providers (APPs -  Physician Assistants and Nurse Practitioners) who all work together to provide you with the care you need, when you need it.  Your next appointment:   3-4 month(s)  The format for your next appointment:   In Person  Provider:   Evalina Field, MD     I HAVE [place] [facility] APPT 12-04-2021 at 2:00 PM  Important Information About Sugar         Signed, Finis Bud, NP  08/07/2022 1:43 PM    [place]

## 2022-08-05 NOTE — Patient Instructions (Signed)
Follow up as needed or as scheduled Apply the Permethrin from your chin down and cover your whole body Sleep in the cream overnight and then wash it off in the morning Wash all of your sheets and towels to avoid re-exposure Call pest control to assess if there's an infestation Call with any questions or concerns Hang in there!!!

## 2022-08-06 ENCOUNTER — Ambulatory Visit: Payer: Medicare HMO | Attending: General Practice | Admitting: Nurse Practitioner

## 2022-08-06 VITALS — BP 126/76 | HR 87 | Ht 65.0 in | Wt 148.8 lb

## 2022-08-06 DIAGNOSIS — R42 Dizziness and giddiness: Secondary | ICD-10-CM

## 2022-08-06 DIAGNOSIS — E041 Nontoxic single thyroid nodule: Secondary | ICD-10-CM

## 2022-08-06 DIAGNOSIS — E0590 Thyrotoxicosis, unspecified without thyrotoxic crisis or storm: Secondary | ICD-10-CM

## 2022-08-06 DIAGNOSIS — E785 Hyperlipidemia, unspecified: Secondary | ICD-10-CM | POA: Diagnosis not present

## 2022-08-06 DIAGNOSIS — I10 Essential (primary) hypertension: Secondary | ICD-10-CM

## 2022-08-06 MED ORDER — AMLODIPINE BESYLATE 5 MG PO TABS: 5.0000 mg | ORAL_TABLET | Freq: Every day | ORAL | 1 refills | Status: AC | PRN

## 2022-08-06 NOTE — Patient Instructions (Addendum)
Medication Instructions:  CONTINUE TO TAKE AMLODIPINE AS NEEDED FOR BLOOD PRESSURE >140 *If you need a refill on your cardiac medications before your next appointment, please call your pharmacy*  Lab Work: NONE  Testing/Procedures:   Follow-Up: At [facility], you and your health needs are our priority.  As part of our continuing mission to provide you with exceptional heart care, we have created designated Provider Care Teams.  These Care Teams include your primary Cardiologist (physician) and Advanced Practice Providers (APPs -  Physician Assistants and Nurse Practitioners) who all work together to provide you with the care you need, when you need it.  Your next appointment:   3-4 month(s)  The format for your next appointment:   In Person  Provider:   Evalina Field, MD     I [place] [facility] APPT 12-04-2021 at 2:00 PM  Important Information About Sugar

## 2022-08-07 ENCOUNTER — Encounter: Payer: Self-pay | Admitting: Nurse Practitioner

## 2022-08-08 ENCOUNTER — Encounter: Payer: Self-pay | Admitting: Nurse Practitioner

## 2022-08-08 NOTE — Telephone Encounter (Signed)
This encounter was created in error - please disregard.

## 2022-08-12 ENCOUNTER — Telehealth: Payer: Self-pay | Admitting: Pharmacist

## 2022-08-12 NOTE — Progress Notes (Signed)
Chronic Care Management Pharmacy Assistant   Name: Margaret Torres  MRN: [id number] DOB: Nov 02, 1945   Reason for Encounter: Disease State - Hypertension Call    Recent office visits:  08/05/22 Annye Asa, MD - [place] - Family Medicine - permethrin (ELIMITE) 5 % cream. Follow up as needed.   06/03/22 Annye Asa, MD - Family Medicine - Physical Exam - Labs were ordered. EKG performed and referral placed to Cardiology. ketoconazole (NIZORAL) 2 % cream prescribed.   04/30/22 Annye Asa, MD - Family Medicine - Abnormal Feces - Labs were ordered. Referral placed to GI. polyethylene glycol powder (GLYCOLAX/MIRALAX) 17 GM/SCOOP powder  prescribed. Follow up as scheduled.   03/19/22 Annye Asa, MD - Family Medicine - GERD - pantoprazole (PROTONIX) 40 MG tablet  and sucralfate (CARAFATE) 1 g tablet and amLODipine (NORVASC) 5 MG tablet prescribed.Amlodipine decreased to 5 mg daily. Add Vit D 2000IU daily. Follow up as scheduled.   Recent consult visits:  08/06/22 Finis Bud, NP - Dizziness - amLODipine (NORVASC) 5 MG tablet  prescribed. Follow up in 3-4 months.   Hospital visits: 05/26/22 Medication Reconciliation was completed by comparing discharge summary, patient's EMR and Pharmacy list, and upon discussion with patient.  Admitted to the hospital on 05/26/22 due to Tick bite. Discharge date was 05/26/22. Discharged from [facility] at [facility].  New?Medications Started at [facility] Discharge:?? doxycycline (VIBRAMYCIN) 100 MG capsule   Medication Changes at Hospital Discharge: None noted.   Medications Discontinued at Hospital Discharge: None noted.   Medications that remain the same after Hospital Discharge:??  All other medications will remain the same.    Medications: Outpatient Encounter Medications as of 08/12/2022  Medication Sig Note   amLODipine (NORVASC) 5 MG tablet Take 1 tablet (5 mg total) by mouth daily as needed  (SBP>140).    Cholecalciferol (VITAMIN D3) 10 MCG (400 UNIT) CAPS Take by mouth.    CLENPIQ 10-3.5-12 MG-GM -GM/175ML SOLN Take by mouth. 06/23/2022: Patient has not started taking .   clonazePAM (KLONOPIN) 0.5 MG tablet Take 1 tablet (0.5 mg total) by mouth 2 (two) times daily as needed.    fluticasone (FLONASE) 50 MCG/ACT nasal spray Use 2 spray(s) in each nostril once daily    loratadine (CLARITIN) 10 MG tablet Take 1 tablet (10 mg total) by mouth daily as needed for allergies. Take 10 mg by mouth daily as needed for allergies.    meclizine (ANTIVERT) 25 MG tablet TAKE 1 TABLET BY MOUTH THREE TIMES DAILY AS NEEDED FOR DIZZINESS    olopatadine (PATADAY) 0.1 % ophthalmic solution 1 drop 2 (two) times daily.    pantoprazole (PROTONIX) 40 MG tablet Take 1 tablet (40 mg total) by mouth daily. (Patient not taking: Reported on 06/23/2022)    permethrin (ELIMITE) 5 % cream Apply from your chin down and sleep in the cream overnight.  Then wash off in the morning.  Wash all sheets and towels    polyethylene glycol powder (GLYCOLAX/MIRALAX) 17 GM/SCOOP powder Take 17 g by mouth 2 (two) times daily as needed.    sucralfate (CARAFATE) 1 g tablet Take 1 tablet (1 g total) by mouth 3 (three) times daily with meals.    Vitamin D, Ergocalciferol, (DRISDOL) 1.25 MG (50000 UNIT) CAPS capsule Take 1 capsule (50,000 Units total) by mouth every 7 (seven) days. (Patient not taking: Reported on 08/06/2022)    Vitamin D, Ergocalciferol, (DRISDOL) 1.25 MG (50000 UNIT) CAPS capsule Take 1 capsule (50,000 Units total) by  mouth every 7 (seven) days. (Patient not taking: Reported on 08/06/2022)    No facility-administered encounter medications on file as of 08/12/2022.    Current antihypertensive regimen:  amLODipine (NORVASC) 5 MG tablet   How often are you checking your Blood Pressure?  Patient reported checking blood pressures  Current home BP readings: patient did not have any to report at time of call     What  recent interventions/DTPs have been made by any provider to improve Blood Pressure control since last CPP Visit:  Patient decreased Amlodipine to 5 mg on 03/19/22. Also added Vitamin D 2000IU daily.    Any recent hospitalizations or ED visits since last visit with CPP? Patient was seen at [facility] for Tick bite on 05/26/22.   What diet changes have been made to improve Blood Pressure Control?  Patient reported she does not add additional salt to her diet.    What exercise is being done to improve your Blood Pressure Control?   Patient reports she tries to be as active as she is able. She does daily housework.   Adherence Review: Is the patient currently on ACE/ARB medication? No Does the patient have >5 day gap between last estimated fill dates? No    Care Gaps  AWV: done 03/25/22 Colonoscopy: done 03/17/12 DM Eye Exam: N/A DM Foot Exam: N/A Microalbumin: N/A HbgAIC: done 05/26/22 (5.6) DEXA: unknown Mammogram: done 07/31/21   Star Rating Drugs: No Star Rating Drugs noted.   Future Appointments  Date Time Provider [place]  12/03/2022  2:00 PM O'Neal, Cassie Freer, MD [facility] None  12/09/2022 12:40 PM Midge Minium, MD [facility]  03/31/2023  4:00 PM [facility] HEALTH COACH [facility] [place] Clinical Pharmacist Assistant  [phone]

## 2022-08-13 ENCOUNTER — Other Ambulatory Visit: Payer: Self-pay

## 2022-08-13 DIAGNOSIS — J309 Allergic rhinitis, unspecified: Secondary | ICD-10-CM

## 2022-08-13 MED ORDER — FLUTICASONE PROPIONATE 50 MCG/ACT NA SUSP: NASAL | 0 refills | Status: AC

## 2022-08-26 ENCOUNTER — Ambulatory Visit
Admission: EM | Admit: 2022-08-26 | Discharge: 2022-08-26 | Disposition: A | Discharge status: Home / Self Care | Payer: Medicare HMO | Attending: Physician Assistant | Admitting: Physician Assistant

## 2022-08-26 DIAGNOSIS — L989 Disorder of the skin and subcutaneous tissue, unspecified: Secondary | ICD-10-CM | POA: Diagnosis not present

## 2022-08-26 MED ORDER — PERMETHRIN 5 % EX CREA: TOPICAL_CREAM | CUTANEOUS | 0 refills | Status: DC

## 2022-08-26 MED ORDER — TRIAMCINOLONE ACETONIDE 0.1 % EX CREA: 1.0000 | TOPICAL_CREAM | Freq: Two times a day (BID) | CUTANEOUS | 0 refills | Status: DC

## 2022-08-26 NOTE — ED Triage Notes (Signed)
Pt presents with what she describes as insect bites for past 3 months with no relief.

## 2022-08-26 NOTE — ED Provider Notes (Signed)
[facility]    CSN: [id number] Arrival date & time: 08/26/22  1503      History   Chief Complaint Chief Complaint  Patient presents with   Insect Bite    HPI Margaret Torres is a 76 y.o. female.   Patient here today for evaluation of possible skin mites.  She reports that she has a sensation of being bit while she is at home.  She cannot see what is biting her however reports itching with occasional visible bites.  She notes she had difficulty sleeping last night due to same.  She states she was prescribed a cream recently but was unable to afford same.  She has called an exterminator but they state that they do not handle mites.  She reports that symptoms have been ongoing for the last 3 months but worse over the last few weeks.  The history is provided by the patient.    Past Medical History:  Diagnosis Date   Allergic rhinitis 07/16/2007   Carbon monoxide exposure    Dizziness    Dysphagia 03/02/2016   Essential hypertension 07/16/2007   First degree hemorrhoids 11/28/2020   Generalized abdominal pain 11/28/2020   GERD (gastroesophageal reflux disease) 10/12/2018   Hemorrhage of rectum and anus 11/28/2020   History of cervical cancer    HTN (hypertension)    Hyperlipidemia 06/26/2007   Hyperthyroidism 04/21/2016   hx of thyroid nodule   Insomnia 06/20/2014   Left anterior knee pain 09/30/2014   Leg pain, bilateral 03/02/2016   Major depressive disorder 07/16/2007   Mild cognitive impairment with memory loss 05/14/2022   Multiple thyroid nodules 12/30/2017   Olecranon bursitis of left elbow 01/30/2016   Primary osteoarthritis of both knees 10/20/2018   RBBB    Second degree hemorrhoids 11/28/2020   Vitamin D deficiency 03/19/2022   Wheezing 11/17/2020    Patient Active Problem List   Diagnosis Date Noted   RBBB 06/23/2022   Abnormal EKG 06/23/2022   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   Irregular heart beat 06/05/2022    Carbon monoxide exposure    Mild cognitive impairment with memory loss 05/14/2022   Vitamin D deficiency 03/19/2022   Abnormal feces 11/28/2020   First degree hemorrhoids 11/28/2020   Generalized abdominal pain 11/28/2020   Second degree hemorrhoids 11/28/2020   Hemorrhage of rectum and anus 11/28/2020   Wheezing 11/17/2020   Primary osteoarthritis of both knees 10/20/2018   GERD (gastroesophageal reflux disease) 10/12/2018   Multiple thyroid nodules 12/30/2017   Diarrhea 09/20/2016   Hyperthyroidism 04/21/2016   Bowel incontinence 04/21/2016   Leg pain, bilateral 03/02/2016   Dysphagia 03/02/2016   Olecranon bursitis of left elbow 01/30/2016   Physical exam 01/27/2015   Bilateral renal cysts 01/27/2015   Left anterior knee pain 09/30/2014   Insomnia 06/20/2014   Hemorrhoids, external 03/27/2014   Major depressive disorder 07/16/2007   Essential hypertension 07/16/2007   Allergic rhinitis 07/16/2007   Hyperlipidemia 06/26/2007   TAH/BSO, HX OF 06/26/2007    Past Surgical History:  Procedure Laterality Date   ABDOMINAL HYSTERECTOMY     WISDOM TOOTH EXTRACTION Bilateral     OB History   No obstetric history on file.      Home Medications    Prior to Admission medications   Medication Sig Start Date End Date Taking? Authorizing Provider  permethrin (ELIMITE) 5 % cream Apply to affected area once 08/26/22  Yes Francene Finders, PA-C  triamcinolone cream (KENALOG)  0.1 % Apply 1 Application topically 2 (two) times daily. 08/26/22  Yes Francene Finders, PA-C  amLODipine (NORVASC) 5 MG tablet Take 1 tablet (5 mg total) by mouth daily as needed (SBP>140). 08/06/22   Finis Bud, NP  Cholecalciferol (VITAMIN D3) 10 MCG (400 UNIT) CAPS Take by mouth.    [provider]  CLENPIQ 10-3.5-12 MG-GM -GM/175ML SOLN Take by mouth. 06/22/22   [provider]  clonazePAM (KLONOPIN) 0.5 MG tablet Take 1 tablet (0.5 mg total) by mouth 2 (two) times daily as needed.  06/11/22   Midge Minium, MD  fluticasone Asencion Islam) 50 MCG/ACT nasal spray Use 2 spray(s) in each nostril once daily 08/13/22   Midge Minium, MD  loratadine (CLARITIN) 10 MG tablet Take 1 tablet (10 mg total) by mouth daily as needed for allergies. Take 10 mg by mouth daily as needed for allergies. 10/26/21   Midge Minium, MD  meclizine (ANTIVERT) 25 MG tablet TAKE 1 TABLET BY MOUTH THREE TIMES DAILY AS NEEDED FOR DIZZINESS 06/28/22   Midge Minium, MD  olopatadine (PATADAY) 0.1 % ophthalmic solution 1 drop 2 (two) times daily.    [provider]  pantoprazole (PROTONIX) 40 MG tablet Take 1 tablet (40 mg total) by mouth daily. Patient not taking: Reported on 06/23/2022 03/19/22   Midge Minium, MD  polyethylene glycol powder (GLYCOLAX/MIRALAX) 17 GM/SCOOP powder Take 17 g by mouth 2 (two) times daily as needed. 05/26/22   Teodora Medici, FNP  sucralfate (CARAFATE) 1 g tablet Take 1 tablet (1 g total) by mouth 3 (three) times daily with meals. 03/19/22   Midge Minium, MD  Vitamin D, Ergocalciferol, (DRISDOL) 1.25 MG (50000 UNIT) CAPS capsule Take 1 capsule (50,000 Units total) by mouth every 7 (seven) days. Patient not taking: Reported on 08/06/2022 02/08/22   Midge Minium, MD  Vitamin D, Ergocalciferol, (DRISDOL) 1.25 MG (50000 UNIT) CAPS capsule Take 1 capsule (50,000 Units total) by mouth every 7 (seven) days. Patient not taking: Reported on 08/06/2022 06/03/22   Midge Minium, MD    Family History Family History  Problem Relation Age of Onset   Cervical cancer Mother    Hypertension Mother    Hypertension Father    Diabetes Sister    Stroke Sister    Cancer Sister        bone cancer   Breast cancer Sister    Heart disease Brother    Diabetes Brother    Stroke Maternal Grandmother    Heart attack Son    Cancer Other    Autism spectrum disorder Other    Thyroid disease Neg Hx     Social History Social History   Tobacco Use    Smoking status: Never   Smokeless tobacco: Never  Substance Use Topics   Alcohol use: No   Drug use: No     Allergies   Pneumococcal vaccine and Influenza vaccines   Review of Systems Review of Systems  Constitutional:  Negative for chills and fever.  Eyes:  Negative for discharge and redness.  Gastrointestinal:  Negative for abdominal pain, nausea and vomiting.  Skin:  Negative for rash.     Physical Exam Triage Vital Signs ED Triage Vitals [08/26/22 1639]  Enc Vitals Group     BP (!) 158/93     Pulse Rate 80     Resp 18     Temp 98.2 F (36.8 C)     Temp Source Oral  SpO2 98 %     Weight      Height      Head Circumference      Peak Flow      Pain Score      Pain Loc      Pain Edu?      Excl. in [place]?    No data found.  Updated Vital Signs BP (!) 158/93 (BP Location: Left Arm)   Pulse 80   Temp 98.2 F (36.8 C) (Oral)   Resp 18   LMP 06/30/2013   SpO2 98%   Visual Acuity Right Eye Distance:   Left Eye Distance:   Bilateral Distance:    Right Eye Near:   Left Eye Near:    Bilateral Near:     Physical Exam Vitals and nursing note reviewed.  Constitutional:      General: She is not in acute distress.    Appearance: Normal appearance. She is not ill-appearing.  HENT:     Head: Normocephalic and atraumatic.  Eyes:     Conjunctiva/sclera: Conjunctivae normal.  Cardiovascular:     Rate and Rhythm: Normal rate.  Pulmonary:     Effort: Pulmonary effort is normal.  Skin:    Comments: Few papular lesions noted to back with signs of excoriation  Neurological:     Mental Status: She is alert.  Psychiatric:        Mood and Affect: Mood normal.        Behavior: Behavior normal.        Thought Content: Thought content normal.      UC Treatments / Results  Labs (all labs ordered are listed, but only abnormal results are displayed) Labs Reviewed - No data to display  EKG   Radiology No results found.  Procedures Procedures (including  critical care time)  Medications Ordered in UC Medications - No data to display  Initial Impression / Assessment and Plan / UC Course  I have reviewed the triage vital signs and the nursing notes.  Pertinent labs & imaging results that were available during my care of the patient were reviewed by me and considered in my medical decision making (see chart for details).    Will send in permethrin as well as triamcinolone cream. Encouraged follow up with any further concerns or persistent symptoms.   Final Clinical Impressions(s) / UC Diagnoses   Final diagnoses:  Skin lesions   Discharge Instructions   None    ED Prescriptions     Medication Sig Dispense Auth. Provider   permethrin (ELIMITE) 5 % cream Apply to affected area once 60 g Ewell Poe F, PA-C   triamcinolone cream (KENALOG) 0.1 % Apply 1 Application topically 2 (two) times daily. 30 g Francene Finders, PA-C      PDMP not reviewed this encounter.   Francene Finders, PA-C 08/26/22 1726

## 2022-08-30 ENCOUNTER — Encounter: Payer: Self-pay | Admitting: Family Medicine

## 2022-08-30 ENCOUNTER — Ambulatory Visit (INDEPENDENT_AMBULATORY_CARE_PROVIDER_SITE_OTHER): Payer: Medicare HMO | Admitting: Family Medicine

## 2022-08-30 VITALS — BP 128/74 | HR 81 | Temp 98.9°F | Resp 17 | Ht 65.0 in | Wt 146.5 lb

## 2022-08-30 DIAGNOSIS — R202 Paresthesia of skin: Secondary | ICD-10-CM

## 2022-08-30 DIAGNOSIS — R413 Other amnesia: Secondary | ICD-10-CM | POA: Diagnosis not present

## 2022-08-30 DIAGNOSIS — L989 Disorder of the skin and subcutaneous tissue, unspecified: Secondary | ICD-10-CM

## 2022-08-30 DIAGNOSIS — R42 Dizziness and giddiness: Secondary | ICD-10-CM

## 2022-08-30 NOTE — Progress Notes (Signed)
Subjective:    [name], female    DOB: [birth year], 76 y.o.   MRN: [id number]  HPI Skin lesions- pt was seen here on 10/16 and dx'd w/ scabies.  She never picked up the Permethrin cream.  Went to UC on 11/16 and was dx'd w/ the same.  Again prescribed Permethrin cream and Triamcinolone for itching.    Pt reports she did the Permethrin tx and was comfortable for 1 night but 'then they came back'.  States she doesn't see what's biting her but is convinced they fly.  Has tried tea tree oil, peppermint oil, alcohol, chigger tx w/o relief.  Washing linens and clothes daily.    'i'm not getting any sleep at night.  I'm fighting these things all night'.  'they're biting my legs, my arms'  'something is eating the foam in my coach'.  'they're in the bathroom- I can feel it in the air'.  'the bedroom is infested'  'they're all over me'.  Has called the exterminator but no one has been to the house.  Pt denies itching, 'it's like they're trying to get down in my skin'.    Review of Systems For ROS see HPI     Objective:   Physical Exam Vitals reviewed.  Constitutional:      General: She is not in acute distress.    Appearance: Normal appearance. She is not ill-appearing.  HENT:     Head: Normocephalic and atraumatic.  Skin:    General: Skin is warm and dry.     Findings: No erythema or rash.     Comments: Small area of excoriations on center of low back w/o evidence of foreign body (pt is convinced 'there's something in there' and keeps asking me to 'squeeze it out')  No surrounding erythema or induration  Neurological:     Mental Status: She is alert.  Psychiatric:     Comments: Tangential thought process- at time difficult to follow Keeps repeating 'i'm not crazy'           Assessment & Plan:  Skin lesions- new.  Pt was seen by me previously for this and prescribed Elimite for possible scabies.  She did not pick up the cream from the pharmacy.  Then went to UC  and was prescribed the same and given topical steroid for itching.  Today, the only visible lesions are in the center of her lower back and appear to be excoriations from her scratching/picking rather than a true skin lesion.  I tried to reassure her that there was no 'nest' or other foreign body in her skin and that there is nothing to remove- despite her repeatedly asking me to 'take it out'.  At this time, she is not listening/believing me that I don't see evidence of bites or infestation.  Again encouraged her to call an exterminator to assess the situation.  In the meantime, will refer to both Derm and Neuro.  Derm to reassure her regarding her skin and neuro to assess for possible underlying neurologic cause for her formication.  She states her bedroom and bathroom are 'infested' but then states she doesn't see them.  She is preoccupied with infestation and now that bugs are not only biting her but nesting in her skin.  I worry about her mental well being at this point but she gets very defensive when I try and ask her about this.  Total time spent w/ pt >45 minutes trying to understand her  situation, redirect her thought process, and counsel her on next steps.  Formication- see above.  Refer to neuro for complete evaluation

## 2022-08-30 NOTE — Patient Instructions (Addendum)
Try not to pick the area on your back to allow it to heal Use the Triamcinolone cream on the area on your back Take the Loratadine (Claritin) daily We'll call you to schedule your dermatology appt Have the exterminator come to the house to inspect and treat if needed Call with any questions or concerns Hang in there!

## 2022-09-10 ENCOUNTER — Other Ambulatory Visit: Payer: Self-pay | Admitting: Family Medicine

## 2022-09-10 DIAGNOSIS — Z1231 Encounter for screening mammogram for malignant neoplasm of breast: Secondary | ICD-10-CM

## 2022-09-20 ENCOUNTER — Ambulatory Visit
Admission: EM | Admit: 2022-09-20 | Discharge: 2022-09-20 | Discharge status: Against Medical Advice | Payer: Medicare HMO

## 2022-09-20 ENCOUNTER — Other Ambulatory Visit: Payer: Self-pay

## 2022-09-20 DIAGNOSIS — H8110 Benign paroxysmal vertigo, unspecified ear: Secondary | ICD-10-CM

## 2022-09-20 MED ORDER — MECLIZINE HCL 25 MG PO TABS: ORAL_TABLET | ORAL | 0 refills | Status: AC

## 2022-09-20 MED ORDER — PERMETHRIN 5 % EX CREA: TOPICAL_CREAM | CUTANEOUS | 0 refills | Status: DC

## 2022-09-22 ENCOUNTER — Telehealth: Payer: Self-pay

## 2022-09-22 NOTE — Telephone Encounter (Signed)
Pt called in and notes the cream for mites did not work, did apply once and notes still not feeling well

## 2022-09-22 NOTE — Telephone Encounter (Signed)
Pt notes she never had a skin test done here and that her derm appt is over a month away, I noted we are out of options we dont know how else to treat this and she needs to wait for derm to evaluate and call their office see if they can get her a sooner appointment. [facility] referral.   Called back and notes she has lost about 15 lb and thinks we should be doing more to check on the weight loss

## 2022-09-22 NOTE — Telephone Encounter (Signed)
2 exterminators have both said nothing is present in her home

## 2022-09-22 NOTE — Telephone Encounter (Signed)
Pt has been referred to Dermatology for evaluation.  She should have an appt upcoming w/ Dr Nevada Crane.  And at this time, I strongly recommend a psychiatric referral to better deal w/ the stress that this is causing her (dx anxiety, formication).  We will place this if she is agreeable

## 2022-09-22 NOTE — Telephone Encounter (Signed)
Scheduled appt for weight loss for friday

## 2022-09-22 NOTE — Telephone Encounter (Signed)
Since she states there are bugs in the home that are biting her, has she called an exterminator or pest control to evaluate the situation?

## 2022-09-22 NOTE — Telephone Encounter (Signed)
Pt notes the bugs are in her skin she has never visually seen any just feels them all the time

## 2022-09-22 NOTE — Telephone Encounter (Signed)
See other phone note

## 2022-09-23 ENCOUNTER — Other Ambulatory Visit: Payer: Self-pay

## 2022-09-23 ENCOUNTER — Emergency Department (HOSPITAL_COMMUNITY)
Admission: EM | Admit: 2022-09-23 | Discharge: 2022-09-24 | Disposition: A | Discharge status: Home / Self Care | Payer: Medicare HMO | Attending: Emergency Medicine | Admitting: Emergency Medicine

## 2022-09-23 DIAGNOSIS — F458 Other somatoform disorders: Secondary | ICD-10-CM | POA: Insufficient documentation

## 2022-09-23 DIAGNOSIS — R202 Paresthesia of skin: Secondary | ICD-10-CM | POA: Insufficient documentation

## 2022-09-23 DIAGNOSIS — I10 Essential (primary) hypertension: Secondary | ICD-10-CM | POA: Diagnosis not present

## 2022-09-23 DIAGNOSIS — F54 Psychological and behavioral factors associated with disorders or diseases classified elsewhere: Secondary | ICD-10-CM

## 2022-09-23 DIAGNOSIS — F411 Generalized anxiety disorder: Secondary | ICD-10-CM | POA: Insufficient documentation

## 2022-09-23 DIAGNOSIS — R634 Abnormal weight loss: Secondary | ICD-10-CM | POA: Diagnosis present

## 2022-09-23 LAB — BASIC METABOLIC PANEL
Anion gap: 8 (ref 5–15)
BUN: 11 mg/dL (ref 8–23)
CO2: 25 mmol/L (ref 22–32)
Calcium: 8.7 mg/dL — ABNORMAL LOW (ref 8.9–10.3)
Chloride: 104 mmol/L (ref 98–111)
Creatinine, Ser: 1.03 mg/dL — ABNORMAL HIGH (ref 0.44–1.00)
GFR, Estimated: 56 mL/min — ABNORMAL LOW (ref >60)
Glucose, Bld: 94 mg/dL (ref 70–99)
Potassium: 3.6 mmol/L (ref 3.5–5.1)
Sodium: 137 mmol/L (ref 135–145)

## 2022-09-23 LAB — URINALYSIS, ROUTINE W REFLEX MICROSCOPIC
Bacteria, UA: NONE SEEN
Bilirubin Urine: NEGATIVE
Glucose, UA: NEGATIVE mg/dL
Ketones, ur: NEGATIVE mg/dL
Leukocytes,Ua: NEGATIVE
Nitrite: NEGATIVE
Protein, ur: NEGATIVE mg/dL
Specific Gravity, Urine: 1.005 (ref 1.005–1.030)
pH: 7 (ref 5.0–8.0)

## 2022-09-23 LAB — HEPATIC FUNCTION PANEL
ALT: 31 U/L (ref 0–44)
AST: 29 U/L (ref 15–41)
Albumin: 3.8 g/dL (ref 3.5–5.0)
Alkaline Phosphatase: 56 U/L (ref 38–126)
Bilirubin, Direct: <0.1 mg/dL (ref 0.0–0.2)
Total Bilirubin: 0.4 mg/dL (ref 0.3–1.2)
Total Protein: 6.6 g/dL (ref 6.5–8.1)

## 2022-09-23 LAB — CBC
HCT: 33.1 % — ABNORMAL LOW (ref 36.0–46.0)
Hemoglobin: 11.4 g/dL — ABNORMAL LOW (ref 12.0–15.0)
MCH: 30.7 pg (ref 26.0–34.0)
MCHC: 34.4 g/dL (ref 30.0–36.0)
MCV: 89.2 fL (ref 80.0–100.0)
Platelets: 284 10*3/uL (ref 150–400)
RBC: 3.71 MIL/uL — ABNORMAL LOW (ref 3.87–5.11)
RDW: 12.9 % (ref 11.5–15.5)
WBC: 5.3 10*3/uL (ref 4.0–10.5)
nRBC: 0 % (ref 0.0–0.2)

## 2022-09-23 MED ORDER — AMLODIPINE BESYLATE 5 MG PO TABS
5.0000 mg | ORAL_TABLET | Freq: Every day | ORAL | Status: DC
  Administered 2022-09-24: 5 mg via ORAL
  Filled 2022-09-23: qty 1

## 2022-09-23 NOTE — ED Triage Notes (Addendum)
Pt. Stated, Margaret Torres been losing weight and feeling weak for a few months and the bug bites have been about 6- 7 weeks. I've been taking Premethin cream . My exterminator said I dont have any bugs. I feel like the mites are on my back especially

## 2022-09-23 NOTE — ED Provider Triage Note (Signed)
Emergency Medicine Provider Triage Evaluation Note  Margaret Torres , a 76 y.o. female  was evaluated in triage.  Pt complains of weight loss over the past 6 months of about 20 pounds, generalized fatigue/weakness, and sensation of bugs crawling on her.  She has been seen multiple times at urgent care for this without relief.  Has had an exterminator at the house without improvement of her symptoms.  Denies night sweats, fever, abdominal pain, chest pain, shortness of breath, productive cough..  Review of Systems  Positive: As above Negative: As above  Physical Exam  BP (!) 150/84   Pulse 76   Temp 99.4 F (37.4 C) (Oral)   Resp 14   LMP 06/30/2013   SpO2 99%  Gen:   Awake, no distress   Resp:  Normal effort  MSK:   Moves extremities without difficulty  Other:    Medical Decision Making  Medically screening exam initiated at 9:23 AM.  Appropriate orders placed.  Margaret Torres was informed that the remainder of the evaluation will be completed by another provider, this initial triage assessment does not replace that evaluation, and the importance of remaining in the ED until their evaluation is complete.     Margaret Courier, PA-C 09/23/22 [phone]

## 2022-09-23 NOTE — ED Provider Notes (Addendum)
[facility] EMERGENCY DEPARTMENT Provider Note   CSN: [id number] Arrival date & time: 09/23/22  0755     History  Chief Complaint  Patient presents with   Weight Loss   Weakness   bug bites    Margaret Torres is a 76 y.o. female.   Weakness  The patient is a 76 year old female with multiple emergency department and primary care visits for weight loss and bug bites/mites who is presenting to the emergency department for evaluation of weight loss and bug bites.  The patient states that she has had weight loss of approximately 20 pounds over the past year.  She states that she has seen her primary care doctor and has been to the emergency department numerous times with a similar complaint however, she feels that nobody is taking her seriously.  The patient states that she lost her husband to cancer and she is worried that she may have cancer as well as he first noticed weight loss.  She is also reporting sensation of bugs crawling all over her skin.  She has been seen for this numerous times and has done multiple rounds of permethrin without improvement in her symptoms.  The patient has called an exterminator out to her house and nobody is ever been identified.  The patient states that symptoms are worse at night she states that she does not really have pruritus, but is more concerned with the sensation of bugs crawling in her skin.     Home Medications Prior to Admission medications   Medication Sig Start Date End Date Taking? Authorizing Provider  amLODipine (NORVASC) 5 MG tablet Take 1 tablet (5 mg total) by mouth daily as needed (SBP>140). 08/06/22   Finis Bud, NP  Cholecalciferol (VITAMIN D3) 10 MCG (400 UNIT) CAPS Take by mouth.    [provider]  CLENPIQ 10-3.5-12 MG-GM -GM/175ML SOLN Take by mouth. 06/22/22   [provider]  clonazePAM (KLONOPIN) 0.5 MG tablet Take 1 tablet (0.5 mg total) by mouth 2 (two) times daily as needed. 06/11/22    Midge Minium, MD  fluticasone Asencion Islam) 50 MCG/ACT nasal spray Use 2 spray(s) in each nostril once daily 08/13/22   Midge Minium, MD  loratadine (CLARITIN) 10 MG tablet Take 1 tablet (10 mg total) by mouth daily as needed for allergies. Take 10 mg by mouth daily as needed for allergies. 10/26/21   Midge Minium, MD  meclizine (ANTIVERT) 25 MG tablet TAKE 1 TABLET BY MOUTH THREE TIMES DAILY AS NEEDED FOR DIZZINESS 09/20/22   Midge Minium, MD  olopatadine (PATADAY) 0.1 % ophthalmic solution 1 drop 2 (two) times daily.    [provider]  omeprazole (PRILOSEC) 20 MG capsule Take 20 mg by mouth 2 (two) times daily. 07/10/22   [provider]  permethrin (ELIMITE) 5 % cream Apply to affected area once 09/20/22   Midge Minium, MD  polyethylene glycol powder (GLYCOLAX/MIRALAX) 17 GM/SCOOP powder Take 17 g by mouth 2 (two) times daily as needed. 05/26/22   Teodora Medici, FNP  sucralfate (CARAFATE) 1 g tablet Take 1 tablet (1 g total) by mouth 3 (three) times daily with meals. 03/19/22   Midge Minium, MD  triamcinolone cream (KENALOG) 0.1 % Apply 1 Application topically 2 (two) times daily. 08/26/22   Francene Finders, PA-C  Vitamin D, Ergocalciferol, (DRISDOL) 1.25 MG (50000 UNIT) CAPS capsule Take 1 capsule (50,000 Units total) by mouth every 7 (seven) days. 02/08/22  Midge Minium, MD  Vitamin D, Ergocalciferol, (DRISDOL) 1.25 MG (50000 UNIT) CAPS capsule Take 1 capsule (50,000 Units total) by mouth every 7 (seven) days. 06/03/22   Midge Minium, MD      Allergies    Pneumococcal vaccine and Influenza vaccines    Review of Systems   Review of Systems  Neurological:  Positive for weakness.   See HPI Physical Exam Updated Vital Signs BP 125/63 (BP Location: Right Arm)   Pulse 66   Temp 98.9 F (37.2 C) (Oral)   Resp 15   LMP 06/30/2013   SpO2 98%  Physical Exam Vitals and nursing note reviewed.  Constitutional:      General:  She is not in acute distress.    Appearance: She is well-developed.  HENT:     Head: Normocephalic and atraumatic.  Eyes:     Conjunctiva/sclera: Conjunctivae normal.  Cardiovascular:     Rate and Rhythm: Normal rate and regular rhythm.     Heart sounds: No murmur heard. Pulmonary:     Effort: Pulmonary effort is normal. No respiratory distress.     Breath sounds: Normal breath sounds.  Abdominal:     Palpations: Abdomen is soft.     Tenderness: There is no abdominal tenderness.  Musculoskeletal:        General: No swelling.     Cervical back: Neck supple.  Skin:    General: Skin is warm and dry.     Capillary Refill: Capillary refill takes less than 2 seconds.     Comments: There are no abnormal skin findings suggesting of rash, scabies, or dermatologic condition  Neurological:     Mental Status: She is alert.     ED Results / Procedures / Treatments   Labs (all labs ordered are listed, but only abnormal results are displayed) Labs Reviewed  BASIC METABOLIC PANEL - Abnormal; Notable for the following components:      Result Value   Creatinine, Ser 1.03 (*)    Calcium 8.7 (*)    GFR, Estimated 56 (*)    All other components within normal limits  CBC - Abnormal; Notable for the following components:   RBC 3.71 (*)    Hemoglobin 11.4 (*)    HCT 33.1 (*)    All other components within normal limits  URINALYSIS, ROUTINE W REFLEX MICROSCOPIC - Abnormal; Notable for the following components:   Color, Urine STRAW (*)    Hgb urine dipstick SMALL (*)    All other components within normal limits  HEPATIC FUNCTION PANEL  CBG MONITORING, ED    EKG   Radiology No results found.  Procedures Procedures    Medications Ordered in ED Medications  amLODipine (NORVASC) tablet 5 mg (has no administration in time range)    ED Course/ Medical Decision Making/ A&P                           Medical Decision Making  The patient is a 76 year old female with multiple  emergency department and primary care visits for weight loss and bug bites/mites who is presenting to the emergency department for evaluation of weight loss and bug bites.  The differential diagnosis considered includes: Malignancy, malnutrition, IBS, malabsorption, psychiatric illness.  On initial presentation, the patient is hemodynamically stable and afebrile.  On physical exam, the patient appeared to be a healthy weight without signs of malnutrition.  A skin exam was conducted and there was no signs  erythema, induration, or areas that would be consistent with bites or scabies.  Throughout the patient interview and physical exam the patient was diffusely scratching her entire upper torso.  The patient's past medical records were reviewed and she was noted to have frequent visits for similar complaints.  Her weight was reviewed at multiple visits over the past year and it appears that she has lost a total of approximately 8 pounds in 2023 with the majority of her weight loss occurring first happened a year.  She currently denies changes to diet, diarrhea, vomiting, nausea, or other concerns for malnutrition.  The patient's primary care notes were also reviewed and found to have multiple concerns a psychiatric component to the patient's formication and tactile hallucinations.  It has been suggested the patient be evaluated by psychiatry however she has refused in the past.  The patient has a dermatology referral for next month and has a PCP follow-up for tomorrow (12/15).  The patient's diagnostic workup in the emergency department included a CBC with white blood cell count of 5.3 and hemoglobin of 11.4 with MCV of 89.2, RDW 12.9, MCHC of 34.4; BMP with sodium 137, potassium 3.6, chloride 104, bicarb 25, glucose 94, BUN 11, creatinine 1.03, calcium 8.7, anion gap of 8; and hepatic function panel within normal limits.  The patient also received an EKG which showed normal sinus rhythm with a right bundle  blanch block which was previously demonstrated.   The patient was presented with the option for psychiatric evaluation while in the hospital and she agreed.  Psychiatry was consulted for further management.  The patient's home meds and a diet were ordered.  Patient was pending psychiatric evaluation at the time of signout  Amount and/or Complexity of Data Reviewed External Data Reviewed: labs and notes. Labs: ordered. Decision-making details documented in ED Course. ECG/medicine tests: independent interpretation performed. Decision-making details documented in ED Course.  Risk Prescription drug management.   Patient's presentation is most consistent with severe exacerbation of chronic illness.         Final Clinical Impression(s) / ED Diagnoses Final diagnoses:  Psychogenic formication    Rx / DC Orders ED Discharge Orders     None            Dani Gobble, MD 09/24/22 0129    Tegeler, Gwenyth Allegra, MD 09/24/22 [phone]

## 2022-09-24 ENCOUNTER — Ambulatory Visit: Payer: Medicare HMO | Admitting: Family Medicine

## 2022-09-24 ENCOUNTER — Encounter (HOSPITAL_COMMUNITY): Payer: Self-pay | Admitting: Emergency Medicine

## 2022-09-24 DIAGNOSIS — R202 Paresthesia of skin: Secondary | ICD-10-CM | POA: Diagnosis not present

## 2022-09-24 DIAGNOSIS — F411 Generalized anxiety disorder: Secondary | ICD-10-CM | POA: Insufficient documentation

## 2022-09-24 MED ORDER — CLONAZEPAM 0.5 MG PO TABS: 0.5000 mg | ORAL_TABLET | Freq: Two times a day (BID) | ORAL | Status: DC | PRN

## 2022-09-24 MED ORDER — POLYETHYLENE GLYCOL 3350 17 G PO PACK: 17.0000 g | PACK | Freq: Every day | ORAL | Status: DC

## 2022-09-24 MED ORDER — PANTOPRAZOLE SODIUM 40 MG PO TBEC
40.0000 mg | DELAYED_RELEASE_TABLET | Freq: Every day | ORAL | Status: DC
  Administered 2022-09-24: 40 mg via ORAL
  Filled 2022-09-24: qty 1

## 2022-09-24 NOTE — BH Assessment (Signed)
Clinician sent secure message to RN Stefania regarding whether patient was able to have her teleassessment.  Have not heard back yet.

## 2022-09-24 NOTE — Consult Note (Signed)
Telepsych Consultation   Reason for Consult:  Psychiatric Reassessment  Referring Physician:  ED Provider Location of Patient:    [facility] Location of Provider: Other: virtual home office  Patient Identification: Sefora Tietje MRN:  [id number] Principal Diagnosis: Formication Diagnosis:  Principal Problem:   Formication Active Problems:   Generalized anxiety disorder   Total Time spent with patient: 30 minutes  Subjective:   Airel Magadan is a 76 y.o. female patient admitted with weakness, weight loss and reports of bug bites.  HPI:   Patient seen via telepsych by this provider; chart reviewed and consulted with Dr. Leverne Humbles on 09/24/22.  On evaluation Malea Swilling   Per record review patient has been evaluated for said concern by her primary care provider Dr. Birdie Riddle, on 12/13 who referred her to dermatology but also recommended a psychiatric assessment for "anxiety and formication." Patient has dermatology visit pending for 12/20 or 12/21.  Appears patient was not open to outpatient psychiatric referral but while she's in the emergency department psychiatry was consulted to review above.   Patient states she feels much better today, she said her #1 concerns was the weight loss and #2 the "outbreak" she reports she's been having over the past few weeks. Pt reports she continuous sensation that something is crawling on her low back and spine.  She states no one else has seen the "mites" and her doctor told her that they were microscopic so not visual to the eye. Historically, she's had an exterminator come to her house but could not find anything.  Fraser Din states she has friends who've had similar concerns and told her she should watch what she says because "they will think you are crazy."   Patient does reports she's concerned about her unexplained weight loss.   Reports approximately 15 pound weight loss since the beginning of the year and does not feel her concerns have been  properly addressed by her provider.  She reports her late husband died of cancer 5 years ago, 3 days before Christmas.  Patient reports he had similar weight loss concerns that were not addressed until later.  She presents mildly anxious but explains she just wants reassurance.   She has a history of anxiety and reports she takes clonazepam prescribed by her PCP as needed for that.  Reviewed PDMP which validates pt was prescribed clonazepam 0.'5mg'$  po BID prn anxiety, last filled 08/02/2022.   Patient denies new onset of new life stressors, anxiety, etc., Pt denies alcohol or illicit substance use.  She denies prior history for self harming behaviors, prior suicide attempts or inpatient psychiatric hospitalizations.   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  She reports an active lifestyle and she likes to fish.  Has arthritis that causes some functional limitations but otherwise she's doing okay.    She lives alone but states her daughter lives just around the corner from her.  She reports she has 9 grandchildren and they are a close knit family.    Labs: UDS was + for benzodiazepines but she is prescribed clonazepam U/A is unremarkable No leukocytosis seen  During evaluation Freya Swindle/ is sitting/dangled at the bedside; She is alert/oriented x 4; calm/cooperative; and mood congruent with affect.  Patient is speaking in a clear tone at moderate volume, and normal pace; with good eye contact.  Her thought process is coherent and relevant; There is no indication that she is currently responding to internal/external stimuli  or experiencing delusional thought content.  Patient denies suicidal/self-harm/homicidal ideation, psychosis, and paranoia.  Patient has remained calm throughout assessment and has answered questions appropriately.   Per ED Provider Admission Assessment 09/23/2022: Chief Complaint  Patient presents with   Weight Loss    Weakness   bug bites      Kylie Simmonds is a 76 y.o. female.     Weakness   The patient is a 76 year old female with multiple emergency department and primary care visits for weight loss and bug bites/mites who is presenting to the emergency department for evaluation of weight loss and bug bites.  The patient states that she has had weight loss of approximately 20 pounds over the past year.  She states that she has seen her primary care doctor and has been to the emergency department numerous times with a similar complaint however, she feels that nobody is taking her seriously.  The patient states that she lost her husband to cancer and she is worried that she may have cancer as well as he first noticed weight loss.  She is also reporting sensation of bugs crawling all over her skin.  She has been seen for this numerous times and has done multiple rounds of permethrin without improvement in her symptoms.  The patient has called an exterminator out to her house and nobody is ever been identified.  The patient states that symptoms are worse at night she states that she does not really have pruritus, but is more concerned with the sensation of bugs crawling in her skin.    Past Psychiatric History: "Anxiety"  Risk to Self:  denies Risk to Others:  denies Prior Inpatient Therapy: denies  Prior Outpatient Therapy:  denies  Past Medical History:  Past Medical History:  Diagnosis Date   Allergic rhinitis 07/16/2007   Carbon monoxide exposure    Dizziness    Dysphagia 03/02/2016   Essential hypertension 07/16/2007   First degree hemorrhoids 11/28/2020   Generalized abdominal pain 11/28/2020   GERD (gastroesophageal reflux disease) 10/12/2018   Hemorrhage of rectum and anus 11/28/2020   History of cervical cancer    HTN (hypertension)    Hyperlipidemia 06/26/2007   Hyperthyroidism 04/21/2016   hx of thyroid nodule   Insomnia 06/20/2014   Left anterior knee pain 09/30/2014   Leg  pain, bilateral 03/02/2016   Major depressive disorder 07/16/2007   Mild cognitive impairment with memory loss 05/14/2022   Multiple thyroid nodules 12/30/2017   Olecranon bursitis of left elbow 01/30/2016   Primary osteoarthritis of both knees 10/20/2018   RBBB    Second degree hemorrhoids 11/28/2020   Vitamin D deficiency 03/19/2022   Wheezing 11/17/2020    Past Surgical History:  Procedure Laterality Date   ABDOMINAL HYSTERECTOMY     WISDOM TOOTH EXTRACTION Bilateral    Family History:  Family History  Problem Relation Age of Onset   Cervical cancer Mother    Hypertension Mother    Hypertension Father    Diabetes Sister    Stroke Sister    Cancer Sister        bone cancer   Breast cancer Sister    Heart disease Brother    Diabetes Brother    Stroke Maternal Grandmother    Heart attack Son    Cancer Other    Autism spectrum disorder Other    Thyroid disease Neg Hx    Family Psychiatric  History: denies Social History:  Social History   Substance and Sexual Activity  Alcohol Use No     Social History   Substance and Sexual Activity  Drug Use No    Social History   Socioeconomic History   Marital status: Divorced    Spouse name: Not on file   Number of children: 3   Years of education: 13   Highest education level: Some college, no degree  Occupational History   Occupation: Retired     Comment: sales/marketing  Tobacco Use   Smoking status: Never   Smokeless tobacco: Never  Substance and Sexual Activity   Alcohol use: No   Drug use: No   Sexual activity: Not Currently    Birth control/protection: Post-menopausal  Other Topics Concern   Not on file  Social History Narrative   Hobbies: enjoys fishing    1 Daughter that lives locally    1 Son lives in [place]    3 [place]    1 Son deceased    Social Determinants of Health   Financial Resource Strain: [place]  (03/25/2022)   Overall Financial Resource Strain (CARDIA)    Difficulty of Paying  Living Expenses: Not hard at all  Food Insecurity: No Food Insecurity (03/25/2022)   Hunger Vital Sign    Worried About Running Out of Food in the Last Year: Never true    [place] in the Last Year: Never true  Transportation Needs: No Transportation Needs (03/25/2022)   PRAPARE - Hydrologist (Medical): No    Lack of Transportation (Non-Medical): No  Physical Activity: Insufficiently Active (03/25/2022)   Exercise Vital Sign    Days of Exercise per Week: 3 days    Minutes of Exercise per Session: 30 min  Stress: No Stress Concern Present (03/25/2022)   [place]    Feeling of Stress : Not at all  Social Connections: Socially Isolated (03/25/2022)   Social Connection and Isolation Panel [NHANES]    Frequency of Communication with Friends and Family: Three times a week    Frequency of Social Gatherings with Friends and Family: Three times a week    Attends Religious Services: Never    Active Member of Clubs or Organizations: No    Attends Archivist Meetings: Never    Marital Status: Divorced   Additional Social History:    Allergies:   Allergies  Allergen Reactions   Pneumococcal Vaccine Shortness Of Breath   Influenza Vaccines     Rash, tongue swelling    Labs:  Results for orders placed or performed during the hospital encounter of 09/23/22 (from the past 48 hour(s))  Urinalysis, Routine w reflex microscopic Urine, Clean Catch     Status: Abnormal   Collection Time: 09/23/22  8:45 AM  Result Value Ref Range   Color, Urine STRAW (A) YELLOW   APPearance CLEAR CLEAR   Specific Gravity, Urine 1.005 1.005 - 1.030   pH 7.0 5.0 - 8.0   Glucose, UA NEGATIVE NEGATIVE mg/dL   Hgb urine dipstick SMALL (A) NEGATIVE   Bilirubin Urine NEGATIVE NEGATIVE   Ketones, ur NEGATIVE NEGATIVE mg/dL   Protein, ur NEGATIVE NEGATIVE mg/dL   Nitrite NEGATIVE NEGATIVE   Leukocytes,Ua  NEGATIVE NEGATIVE   RBC / HPF 0-5 0 - 5 RBC/hpf   WBC, UA 0-5 0 - 5 WBC/hpf   Bacteria, UA NONE SEEN NONE SEEN    Comment: Performed at [facility] [address] [place], [place] [address]  Basic metabolic panel     Status: Abnormal   Collection Time: 09/23/22  8:54 AM  Result Value Ref Range   Sodium 137 135 - 145 mmol/L   Potassium 3.6 3.5 - 5.1 mmol/L   Chloride 104 98 - 111 mmol/L   CO2 25 22 - 32 mmol/L   Glucose, Bld 94 70 - 99 mg/dL    Comment: Glucose reference range applies only to samples taken after fasting for at least 8 hours.   BUN 11 8 - 23 mg/dL   Creatinine, Ser 1.03 (H) 0.44 - 1.00 mg/dL   Calcium 8.7 (L) 8.9 - 10.3 mg/dL   GFR, Estimated 56 (L) >60 mL/min    Comment: (NOTE) Calculated using the CKD-EPI Creatinine Equation (2021)    Anion gap 8 5 - 15    Comment: Performed at [facility] [address], [place], [place] [address]  CBC     Status: Abnormal   Collection Time: 09/23/22  8:54 AM  Result Value Ref Range   WBC 5.3 4.0 - 10.5 K/uL   RBC 3.71 (L) 3.87 - 5.11 MIL/uL   Hemoglobin 11.4 (L) 12.0 - 15.0 g/dL   HCT 33.1 (L) 36.0 - 46.0 %   MCV 89.2 80.0 - 100.0 fL   MCH 30.7 26.0 - 34.0 pg   MCHC 34.4 30.0 - 36.0 g/dL   RDW 12.9 11.5 - 15.5 %   Platelets 284 150 - 400 K/uL   nRBC 0.0 0.0 - 0.2 %    Comment: Performed at [facility], [place] [address], [place], [place] [address]  Hepatic function panel     Status: None   Collection Time: 09/23/22  9:27 AM  Result Value Ref Range   Total Protein 6.6 6.5 - 8.1 g/dL   Albumin 3.8 3.5 - 5.0 g/dL   AST 29 15 - 41 U/L   ALT 31 0 - 44 U/L   Alkaline Phosphatase 56 38 - 126 U/L   Total Bilirubin 0.4 0.3 - 1.2 mg/dL   Bilirubin, Direct <0.1 0.0 - 0.2 mg/dL   Indirect Bilirubin NOT CALCULATED 0.3 - 0.9 mg/dL    Comment: Performed at [facility] [address], [place], [place] [address]    Medications:  No current facility-administered medications for this encounter.    Current Outpatient Medications  Medication Sig Dispense Refill   amLODipine (NORVASC) 5 MG tablet Take 1 tablet (5 mg total) by mouth daily as needed (SBP>140). 90 tablet 1   Cholecalciferol (VITAMIN D3) 10 MCG (400 UNIT) CAPS Take by mouth.     CLENPIQ 10-3.5-12 MG-GM -GM/175ML SOLN Take by mouth.     clonazePAM (KLONOPIN) 0.5 MG tablet Take 1 tablet (0.5 mg total) by mouth 2 (two) times daily as needed. 60 tablet 1   fluticasone (FLONASE) 50 MCG/ACT nasal spray Use 2 spray(s) in each nostril once daily 16 g 0   loratadine (CLARITIN) 10 MG tablet Take 1 tablet (10 mg total) by mouth daily as needed for allergies. Take 10 mg by mouth daily as needed for allergies. 30 tablet 2   meclizine (ANTIVERT) 25 MG tablet TAKE 1 TABLET BY MOUTH THREE TIMES DAILY AS NEEDED FOR DIZZINESS 30 tablet 0   olopatadine (PATADAY) 0.1 % ophthalmic solution 1 drop 2 (two) times daily.     omeprazole (PRILOSEC) 20 MG capsule Take 20 mg by mouth 2 (two) times daily.     permethrin (ELIMITE) 5 % cream Apply to affected  area once 60 g 0   polyethylene glycol powder (GLYCOLAX/MIRALAX) 17 GM/SCOOP powder Take 17 g by mouth 2 (two) times daily as needed. 3350 g 0   sucralfate (CARAFATE) 1 g tablet Take 1 tablet (1 g total) by mouth 3 (three) times daily with meals. 90 tablet 0   triamcinolone cream (KENALOG) 0.1 % Apply 1 Application topically 2 (two) times daily. 30 g 0   Vitamin D, Ergocalciferol, (DRISDOL) 1.25 MG (50000 UNIT) CAPS capsule Take 1 capsule (50,000 Units total) by mouth every 7 (seven) days. 12 capsule 0   Vitamin D, Ergocalciferol, (DRISDOL) 1.25 MG (50000 UNIT) CAPS capsule Take 1 capsule (50,000 Units total) by mouth every 7 (seven) days. 7 capsule 12    Musculoskeletal: pt seen moving all extremities and ambulating independently.  Strength & Muscle Tone: within normal limits Gait & Station: normal Patient leans: N/A   Psychiatric Specialty Exam:  Presentation  General Appearance: Appropriate  for Environment; Casual; Neat  Eye Contact:Good  Speech:Clear and Coherent; Normal Rate  Speech Volume:Normal  Handedness:Right   Mood and Affect  Mood:Anxious  Affect:Congruent; Appropriate   Thought Process  Thought Processes:Coherent; Goal Directed  Descriptions of Associations:Intact  Orientation:Full (Time, Place and Person)  Thought Content:Rumination  History of Schizophrenia/Schizoaffective disorder:No data recorded Duration of Psychotic Symptoms:No data recorded Hallucinations:Hallucinations: None  Ideas of Reference:None  Suicidal Thoughts:Suicidal Thoughts: No  Homicidal Thoughts:Homicidal Thoughts: No   Sensorium  Memory:Immediate Good; Recent Good; Remote Good  Judgment:Good  Insight:Good   Executive Functions  Concentration:Good  Attention Span:Good  [place] of Knowledge:Good  Language:Good   Psychomotor Activity  Psychomotor Activity:Psychomotor Activity: Normal   Assets  Assets:Communication Skills; Financial Resources/Insurance; Housing; Social Support; Transportation   Sleep  Sleep:Sleep: Good Number of Hours of Sleep: 7    Physical Exam: Physical Exam Vitals and nursing note reviewed.  Constitutional:      Appearance: Normal appearance.  Cardiovascular:     Rate and Rhythm: Normal rate.  Pulmonary:     Effort: Pulmonary effort is normal.  Musculoskeletal:     Cervical back: Normal range of motion.  Neurological:     Mental Status: She is alert.    ROS Blood pressure 125/75, pulse 60, temperature 97.9 F (36.6 C), temperature source Oral, resp. rate 18, height '5\' 2"'$  (1.575 m), weight 66.5 kg, last menstrual period 06/30/2013, SpO2 99 %. Body mass index is 26.81 kg/m.  Treatment Plan Summary: Ms. Vensel is a 76 year old female who presents to the emergency room with chief complaint of sensation that something is crawling on her back and weight loss.  She's been evaluated outpatient by her PCP and  treated with permethrin, referred to dermatology and a psych evaluation was recommended as her concern was recurrent within the past few weeks.  During evaluation, pt is clear and coherent and does not appear psychotic. She denies SI, HI and AVH and does not have a hx for schizophrenia, bipolar, or psychotic depression.  She does not drink alcohol or use illicit substances.  However, appears she may have some fixed delusions about bugs crawling on her but this is personally tormenting and not an acute safety concern. Pt lives alone but her daughter lives around the corner from her and she has a very supportive family.  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  Will ask SW to add outpatient mental health resources to her AVS in the event she wants to use in  the future; and recommend she keep her scheduled follow-up with dermatology next week.   I've tried to reach patient's daughter, Kyria Bumgardner, who's listed in the chart for collateral but could not; the phone rings multiple time before disconnecting.  Patient is not a safety concerns, and can be released without collateral.   Disposition: No evidence of imminent risk to self or others at present.   Patient does not meet criteria for psychiatric inpatient admission. Supportive therapy provided about ongoing stressors. Discussed crisis plan, support from social network, calling 911, coming to the Emergency Department, and calling Suicide Hotline.  This service was provided via telemedicine using a 2-way, interactive audio and video technology.  Names of all persons participating in this telemedicine service and their role in this encounter. Name: Aryam Zhan Role: Patient  Name: Merlyn Lot Role: [place]  Name: Luiz Blare Role: Psychiatrist    Mallie Darting, NP 09/24/2022 10:25 PM

## 2022-09-24 NOTE — ED Notes (Signed)
Pt called family using hospital provided phone.

## 2022-09-24 NOTE — ED Notes (Signed)
Patient provided with meal tray.

## 2022-09-24 NOTE — ED Provider Notes (Signed)
Emergency Medicine Observation Re-evaluation Note  Margaret Torres is a 76 y.o. female, seen on rounds today.  Pt initially presented to the ED for complaints of Weight Loss, Weakness, and bug bites Currently, the patient is resting.  Physical Exam  BP 125/75   Pulse 60   Temp 97.9 F (36.6 C) (Oral)   Resp 18   Ht '5\' 2"'$  (1.575 m)   Wt 66.5 kg   LMP 06/30/2013   SpO2 99%   BMI 26.81 kg/m  Physical Exam General: Resting Cardiac: Well perfused Lungs: Normal work of breathing Psych: Calm, cooperative  ED Course / MDM  EKG:EKG Interpretation  Date/Time:  Thursday September 23 2022 08:42:11 EST Ventricular Rate:  71 PR Interval:  152 QRS Duration: 134 QT Interval:  402 QTC Calculation: 436 R Axis:   -53 Text Interpretation: Normal sinus rhythm Right bundle branch block Left anterior fascicular block ** Bifascicular block ** Abnormal ECG When compared with ECG of 23-Sep-2022 08:37, PREVIOUS ECG IS PRESENT when compared to prior, similar appearance. No STEMI Confirmed by Antony Blackbird [phone]) on 09/23/2022 9:38:04 PM  I have reviewed the labs performed to date as well as medications administered while in observation. Home meds ordered. Patient's home PRN clonazepam ordered.  Plan  Current plan is for TTS evaluation.    Audley Hose, MD 09/24/22 1140

## 2022-09-24 NOTE — ED Notes (Signed)
TTS online via video conference for asssessment
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
## 2022-09-24 NOTE — Discharge Instructions (Signed)
Thank you for coming to [facility] Emergency Department. You were seen for weight loss and the sensation of bugs crawling on your skin. We did an exam, labs, and imaging, and these showed no acute findings. You were evaluated by psychiatry who deemed you safe to be discharged home.  Please follow up with your primary care provider within 1 week.  Please also follow up with a behavioral health counselor in the next 1-2 weeks. Resource guides are attached.   Do not hesitate to return to the ED or call 911 if you experience: -Worsening symptoms -Lightheadedness, passing out -Fevers/chills -Anything else that concerns you

## 2022-09-27 ENCOUNTER — Other Ambulatory Visit: Payer: Self-pay | Admitting: Family Medicine

## 2022-09-27 MED ORDER — CLONAZEPAM 0.5 MG PO TABS: 0.5000 mg | ORAL_TABLET | Freq: Two times a day (BID) | ORAL | 1 refills | Status: DC | PRN

## 2022-09-27 NOTE — Telephone Encounter (Signed)
Clonazepam 0.5 mg LOV: 08/30/22 Last Refill:06/11/22 Upcoming appt: 12/09/22

## 2022-09-27 NOTE — Telephone Encounter (Signed)
Encourage patient to contact the pharmacy for refills or they can request refills through [facility]  (Please schedule appointment if patient has not been seen in over a year)  Last ov was 08/30/22  WHAT [place] THIS SENT TO:  [facility] (SE), [place] [address]    MEDICATION NAME & DOSE: clonazepam 0.5 mg   NOTES/COMMENTS FROM PATIENT: Patient called stating that pharmacy is waiting for our office to release this refill for this patient medication.       [place] office please notify patient: It takes 48-72 hours to process rx refill requests Ask patient to call pharmacy to ensure rx is ready before heading there.

## 2022-09-28 ENCOUNTER — Other Ambulatory Visit: Payer: Self-pay

## 2022-09-28 ENCOUNTER — Inpatient Hospital Stay (HOSPITAL_COMMUNITY)
Admission: EM | Admit: 2022-09-28 | Discharge: 2022-09-30 | DRG: 062 | Disposition: A | Discharge status: Home Health | Payer: Medicare HMO | Attending: Neurology | Admitting: Neurology

## 2022-09-28 ENCOUNTER — Emergency Department (HOSPITAL_COMMUNITY): Payer: Medicare HMO

## 2022-09-28 ENCOUNTER — Inpatient Hospital Stay (HOSPITAL_COMMUNITY): Payer: Medicare HMO

## 2022-09-28 ENCOUNTER — Encounter (HOSPITAL_COMMUNITY): Payer: Self-pay

## 2022-09-28 ENCOUNTER — Telehealth: Payer: Self-pay | Admitting: Family Medicine

## 2022-09-28 DIAGNOSIS — Z8249 Family history of ischemic heart disease and other diseases of the circulatory system: Secondary | ICD-10-CM | POA: Diagnosis not present

## 2022-09-28 DIAGNOSIS — Z7982 Long term (current) use of aspirin: Secondary | ICD-10-CM | POA: Diagnosis not present

## 2022-09-28 DIAGNOSIS — Z79899 Other long term (current) drug therapy: Secondary | ICD-10-CM | POA: Diagnosis not present

## 2022-09-28 DIAGNOSIS — Z7951 Long term (current) use of inhaled steroids: Secondary | ICD-10-CM

## 2022-09-28 DIAGNOSIS — F32A Depression, unspecified: Secondary | ICD-10-CM

## 2022-09-28 DIAGNOSIS — M170 Bilateral primary osteoarthritis of knee: Secondary | ICD-10-CM | POA: Diagnosis present

## 2022-09-28 DIAGNOSIS — R4701 Aphasia: Secondary | ICD-10-CM | POA: Diagnosis present

## 2022-09-28 DIAGNOSIS — Z8541 Personal history of malignant neoplasm of cervix uteri: Secondary | ICD-10-CM | POA: Diagnosis not present

## 2022-09-28 DIAGNOSIS — Z8049 Family history of malignant neoplasm of other genital organs: Secondary | ICD-10-CM | POA: Diagnosis not present

## 2022-09-28 DIAGNOSIS — F411 Generalized anxiety disorder: Secondary | ICD-10-CM | POA: Diagnosis present

## 2022-09-28 DIAGNOSIS — I6389 Other cerebral infarction: Secondary | ICD-10-CM | POA: Diagnosis not present

## 2022-09-28 DIAGNOSIS — I639 Cerebral infarction, unspecified: Secondary | ICD-10-CM | POA: Diagnosis present

## 2022-09-28 DIAGNOSIS — R29706 NIHSS score 6: Secondary | ICD-10-CM | POA: Diagnosis not present

## 2022-09-28 DIAGNOSIS — R531 Weakness: Secondary | ICD-10-CM | POA: Diagnosis not present

## 2022-09-28 DIAGNOSIS — Z887 Allergy status to serum and vaccine status: Secondary | ICD-10-CM

## 2022-09-28 DIAGNOSIS — R4781 Slurred speech: Secondary | ICD-10-CM | POA: Diagnosis not present

## 2022-09-28 DIAGNOSIS — Z803 Family history of malignant neoplasm of breast: Secondary | ICD-10-CM | POA: Diagnosis not present

## 2022-09-28 DIAGNOSIS — I6381 Other cerebral infarction due to occlusion or stenosis of small artery: Secondary | ICD-10-CM | POA: Diagnosis not present

## 2022-09-28 DIAGNOSIS — I10 Essential (primary) hypertension: Secondary | ICD-10-CM | POA: Diagnosis not present

## 2022-09-28 DIAGNOSIS — I63412 Cerebral infarction due to embolism of left middle cerebral artery: Secondary | ICD-10-CM | POA: Diagnosis not present

## 2022-09-28 DIAGNOSIS — E785 Hyperlipidemia, unspecified: Secondary | ICD-10-CM | POA: Diagnosis present

## 2022-09-28 DIAGNOSIS — F40240 Claustrophobia: Secondary | ICD-10-CM | POA: Diagnosis present

## 2022-09-28 DIAGNOSIS — K219 Gastro-esophageal reflux disease without esophagitis: Secondary | ICD-10-CM | POA: Diagnosis present

## 2022-09-28 DIAGNOSIS — I161 Hypertensive emergency: Secondary | ICD-10-CM | POA: Diagnosis present

## 2022-09-28 DIAGNOSIS — I6602 Occlusion and stenosis of left middle cerebral artery: Secondary | ICD-10-CM | POA: Diagnosis not present

## 2022-09-28 DIAGNOSIS — Z823 Family history of stroke: Secondary | ICD-10-CM | POA: Diagnosis not present

## 2022-09-28 LAB — COMPREHENSIVE METABOLIC PANEL
ALT: 31 U/L (ref 0–44)
AST: 33 U/L (ref 15–41)
Albumin: 3.9 g/dL (ref 3.5–5.0)
Alkaline Phosphatase: 55 U/L (ref 38–126)
Anion gap: 10 (ref 5–15)
BUN: 13 mg/dL (ref 8–23)
CO2: 21 mmol/L — ABNORMAL LOW (ref 22–32)
Calcium: 8.9 mg/dL (ref 8.9–10.3)
Chloride: 107 mmol/L (ref 98–111)
Creatinine, Ser: 1.05 mg/dL — ABNORMAL HIGH (ref 0.44–1.00)
GFR, Estimated: 55 mL/min — ABNORMAL LOW (ref >60)
Glucose, Bld: 108 mg/dL — ABNORMAL HIGH (ref 70–99)
Potassium: 3.5 mmol/L (ref 3.5–5.1)
Sodium: 138 mmol/L (ref 135–145)
Total Bilirubin: 0.3 mg/dL (ref 0.3–1.2)
Total Protein: 6.7 g/dL (ref 6.5–8.1)

## 2022-09-28 LAB — CBC
HCT: 33.3 % — ABNORMAL LOW (ref 36.0–46.0)
Hemoglobin: 11.5 g/dL — ABNORMAL LOW (ref 12.0–15.0)
MCH: 30.5 pg (ref 26.0–34.0)
MCHC: 34.5 g/dL (ref 30.0–36.0)
MCV: 88.3 fL (ref 80.0–100.0)
Platelets: 253 10*3/uL (ref 150–400)
RBC: 3.77 MIL/uL — ABNORMAL LOW (ref 3.87–5.11)
RDW: 12.7 % (ref 11.5–15.5)
WBC: 8.8 10*3/uL (ref 4.0–10.5)
nRBC: 0 % (ref 0.0–0.2)

## 2022-09-28 LAB — ETHANOL: Alcohol, Ethyl (B): <10 mg/dL (ref <10)

## 2022-09-28 LAB — DIFFERENTIAL
Abs Immature Granulocytes: 0.03 10*3/uL (ref 0.00–0.07)
Basophils Absolute: 0 10*3/uL (ref 0.0–0.1)
Basophils Relative: 0 %
Eosinophils Absolute: 0 10*3/uL (ref 0.0–0.5)
Eosinophils Relative: 1 %
Immature Granulocytes: 0 %
Lymphocytes Relative: 19 %
Lymphs Abs: 1.7 10*3/uL (ref 0.7–4.0)
Monocytes Absolute: 0.5 10*3/uL (ref 0.1–1.0)
Monocytes Relative: 6 %
Neutro Abs: 6.5 10*3/uL (ref 1.7–7.7)
Neutrophils Relative %: 74 %

## 2022-09-28 LAB — PROTIME-INR
INR: 1.1 (ref 0.8–1.2)
Prothrombin Time: 14.3 seconds (ref 11.4–15.2)

## 2022-09-28 LAB — I-STAT CHEM 8, ED
BUN: 15 mg/dL (ref 8–23)
Calcium, Ion: 1.1 mmol/L — ABNORMAL LOW (ref 1.15–1.40)
Chloride: 104 mmol/L (ref 98–111)
Creatinine, Ser: 1 mg/dL (ref 0.44–1.00)
Glucose, Bld: 109 mg/dL — ABNORMAL HIGH (ref 70–99)
HCT: 35 % — ABNORMAL LOW (ref 36.0–46.0)
Hemoglobin: 11.9 g/dL — ABNORMAL LOW (ref 12.0–15.0)
Potassium: 3.4 mmol/L — ABNORMAL LOW (ref 3.5–5.1)
Sodium: 140 mmol/L (ref 135–145)
TCO2: 24 mmol/L (ref 22–32)

## 2022-09-28 LAB — CBG MONITORING, ED: Glucose-Capillary: 120 mg/dL — ABNORMAL HIGH (ref 70–99)

## 2022-09-28 LAB — APTT: aPTT: 31 seconds (ref 24–36)

## 2022-09-28 MED ORDER — LABETALOL HCL 5 MG/ML IV SOLN
10.0000 mg | Freq: Once | INTRAVENOUS | Status: AC
  Administered 2022-09-28: 10 mg via INTRAVENOUS

## 2022-09-28 MED ORDER — STROKE: EARLY STAGES OF RECOVERY BOOK
Freq: Once | Status: AC
  Filled 2022-09-28: qty 1

## 2022-09-28 MED ORDER — PANTOPRAZOLE SODIUM 40 MG IV SOLR
40.0000 mg | Freq: Every day | INTRAVENOUS | Status: DC
  Administered 2022-09-28: 40 mg via INTRAVENOUS
  Filled 2022-09-28: qty 10

## 2022-09-28 MED ORDER — ACETAMINOPHEN 650 MG RE SUPP: 650.0000 mg | RECTAL | Status: DC | PRN

## 2022-09-28 MED ORDER — CLONAZEPAM 0.5 MG PO TABS: 0.5000 mg | ORAL_TABLET | Freq: Two times a day (BID) | ORAL | 1 refills | Status: AC | PRN

## 2022-09-28 MED ORDER — ACETAMINOPHEN 325 MG PO TABS: 650.0000 mg | ORAL_TABLET | ORAL | Status: DC | PRN

## 2022-09-28 MED ORDER — TENECTEPLASE FOR STROKE
0.2500 mg/kg | PACK | Freq: Once | INTRAVENOUS | Status: AC
  Administered 2022-09-28: 17 mg via INTRAVENOUS

## 2022-09-28 MED ORDER — NICARDIPINE HCL IN NACL 20-0.86 MG/200ML-% IV SOLN: 0.0000 mg/h | INTRAVENOUS | Status: DC | PRN

## 2022-09-28 MED ORDER — SODIUM CHLORIDE 0.9 % IV SOLN: INTRAVENOUS | Status: DC

## 2022-09-28 MED ORDER — LABETALOL HCL 5 MG/ML IV SOLN: 10.0000 mg | Freq: Once | INTRAVENOUS | Status: DC | PRN

## 2022-09-28 MED ORDER — ACETAMINOPHEN 160 MG/5ML PO SOLN: 650.0000 mg | ORAL | Status: DC | PRN

## 2022-09-28 MED ORDER — IOHEXOL 350 MG/ML SOLN
100.0000 mL | Freq: Once | INTRAVENOUS | Status: AC | PRN
  Administered 2022-09-28: 100 mL via INTRAVENOUS

## 2022-09-28 MED ORDER — SODIUM CHLORIDE 0.9% FLUSH
3.0000 mL | Freq: Once | INTRAVENOUS | Status: AC
  Administered 2022-09-28: 3 mL via INTRAVENOUS

## 2022-09-28 NOTE — Telephone Encounter (Signed)
Patient called stating that she only received a 10 day supply of her klonopin and wanted to see if that was a mistake. I verified with her that Dr Birdie Riddle sent a quantity of 20 pills with one refill. Patient states that she has been taking these for 20 years and she always sends her in a 30 day supply. I let her know that I would send this to Dr Birdie Riddle and see if this needed to be corrected and would have someone give her a call back with the update.

## 2022-09-28 NOTE — H&P (Signed)
Neurology H&P  CC: Aphasia  History is obtained from: Patient  HPI: Margaret Torres is a 76 y.o. female with a history of hypertension, hyperlipidemia who presents with acute aphasia.  She was in her normal state of health at 4 PM, and then around 4:45 PM, her daughter noticed that she was having some difficulty with her speech.   LKW: 4 PM tpa given?:  Yes IR Thrombectomy? No, distal occlusion Modified Rankin Scale: 0-Completely asymptomatic and back to baseline post- stroke NIHSS: 6  Past Medical History:  Diagnosis Date   Allergic rhinitis 07/16/2007   Carbon monoxide exposure    Dizziness    Dysphagia 03/02/2016   Essential hypertension 07/16/2007   First degree hemorrhoids 11/28/2020   Generalized abdominal pain 11/28/2020   GERD (gastroesophageal reflux disease) 10/12/2018   Hemorrhage of rectum and anus 11/28/2020   History of cervical cancer    HTN (hypertension)    Hyperlipidemia 06/26/2007   Hyperthyroidism 04/21/2016   hx of thyroid nodule   Insomnia 06/20/2014   Left anterior knee pain 09/30/2014   Leg pain, bilateral 03/02/2016   Major depressive disorder 07/16/2007   Mild cognitive impairment with memory loss 05/14/2022   Multiple thyroid nodules 12/30/2017   Olecranon bursitis of left elbow 01/30/2016   Primary osteoarthritis of both knees 10/20/2018   RBBB    Second degree hemorrhoids 11/28/2020   Vitamin D deficiency 03/19/2022   Wheezing 11/17/2020     Family History  Problem Relation Age of Onset   Cervical cancer Mother    Hypertension Mother    Hypertension Father    Diabetes Sister    Stroke Sister    Cancer Sister        bone cancer   Breast cancer Sister    Heart disease Brother    Diabetes Brother    Stroke Maternal Grandmother    Heart attack Son    Cancer Other    Autism spectrum disorder Other    Thyroid disease Neg Hx      Social History:  reports that she has never smoked. She has never used smokeless tobacco. She reports  that she does not drink alcohol and does not use drugs.   Prior to Admission medications   Medication Sig Start Date End Date Taking? Authorizing Provider  amLODipine (NORVASC) 5 MG tablet Take 1 tablet (5 mg total) by mouth daily as needed (SBP>140). 08/06/22   Finis Bud, NP  Cholecalciferol (VITAMIN D3) 10 MCG (400 UNIT) CAPS Take by mouth.    [provider]  CLENPIQ 10-3.5-12 MG-GM -GM/175ML SOLN Take by mouth. 06/22/22   [provider]  clonazePAM (KLONOPIN) 0.5 MG tablet Take 1 tablet (0.5 mg total) by mouth 2 (two) times daily as needed. 09/28/22   Midge Minium, MD  fluticasone Asencion Islam) 50 MCG/ACT nasal spray Use 2 spray(s) in each nostril once daily 08/13/22   Midge Minium, MD  loratadine (CLARITIN) 10 MG tablet Take 1 tablet (10 mg total) by mouth daily as needed for allergies. Take 10 mg by mouth daily as needed for allergies. 10/26/21   Midge Minium, MD  meclizine (ANTIVERT) 25 MG tablet TAKE 1 TABLET BY MOUTH THREE TIMES DAILY AS NEEDED FOR DIZZINESS 09/20/22   Midge Minium, MD  olopatadine (PATADAY) 0.1 % ophthalmic solution 1 drop 2 (two) times daily.    [provider]  omeprazole (PRILOSEC) 20 MG capsule Take 20 mg by mouth 2 (two) times daily. 07/10/22   [provider]  permethrin (ELIMITE) 5 % cream Apply to affected area once 09/20/22   Midge Minium, MD  polyethylene glycol powder (GLYCOLAX/MIRALAX) 17 GM/SCOOP powder Take 17 g by mouth 2 (two) times daily as needed. 05/26/22   Teodora Medici, FNP  sucralfate (CARAFATE) 1 g tablet Take 1 tablet (1 g total) by mouth 3 (three) times daily with meals. 03/19/22   Midge Minium, MD  triamcinolone cream (KENALOG) 0.1 % Apply 1 Application topically 2 (two) times daily. 08/26/22   Francene Finders, PA-C  Vitamin D, Ergocalciferol, (DRISDOL) 1.25 MG (50000 UNIT) CAPS capsule Take 1 capsule (50,000 Units total) by mouth every 7 (seven) days. 02/08/22   Midge Minium, MD  Vitamin D, Ergocalciferol, (DRISDOL) 1.25 MG (50000 UNIT) CAPS capsule Take 1 capsule (50,000 Units total) by mouth every 7 (seven) days. 06/03/22   Midge Minium, MD     Exam: Current vital signs: Wt 68.8 kg   LMP 06/30/2013   BMI 27.74 kg/m    Physical Exam  Constitutional: Appears well-developed and well-nourished.  Psych: Affect appropriate to situation Eyes: No scleral injection HENT: No OP obstrucion Head: Normocephalic.  Cardiovascular: Normal rate and regular rhythm.  Respiratory: Effort normal and breath sounds normal to anterior ascultation GI: Soft.  No distension. There is no tenderness.  Skin: WDI  Neuro: Mental Status: Patient is awake, alert, she has significant expressive and receptive aphasia.  She is able to say formed words, but the words are often inappropriate, she makes paraphasic errors and it is not clear the point she is try to make. Cranial Nerves: II: Visual Fields are full. Pupils are equal, round, and reactive to light.   III,IV, VI: EOMI without ptosis or diploplia.  V: Facial sensation is symmetric to temperature VII: Facial movement is symmetric.  VIII: hearing is intact to voice X: Uvula elevates symmetrically XI: Shoulder shrug is symmetric. XII: tongue is midline without atrophy or fasciculations.  Motor: Tone is normal. Bulk is normal. 5/5 strength was present in all four extremities.  Sensory: Sensation is symmetric to light touch and temperature in the arms and legs. Cerebellar: No clear ataxia on finger-nose-finger though she does not cooperate with testing very well and does not perform heel-knee-shin  I have reviewed labs in epic and the pertinent results are: BMP is unremarkable  I have reviewed the images obtained: CT/CTA-no acute infarct but there is a M3 branch occlusion  Primary Diagnosis:  Cerebral infarction due to embolism of  left middle cerebral artery.   Secondary Diagnosis: Essential  (primary) hypertension and Hypertension Emergency (SBP > 180 or DBP > 120 & end organ damage)   Impression: 76 year old female who presents with left M3 occlusion.  She is within the timeframe for IV tenecteplase.  Discussion with the family was extremely prolonged, within fixating on her age and whether we should be doing this in someone her age, as well as concerns and fixations on the risks associated with it.  After discussion of the risks, benefits, and alternatives of IV tenecteplase, the family did agree with proceeding with the medication.  Plan: - HgbA1c, fasting lipid panel - MRI of the brain without contrast - Frequent neuro checks - Echocardiogram - CTA head and neck - Prophylactic therapy-none for 24 hours - Risk factor modification - Telemetry monitoring - PT consult, OT consult, Speech consult   This patient is critically ill and at significant risk of neurological worsening, death and care requires constant monitoring of  vital signs, hemodynamics,respiratory and cardiac monitoring, neurological assessment, discussion with family, other specialists and medical decision making of high complexity. I spent 65 minutes of neurocritical care time  in the care of  this patient. This was time spent independent of any time provided by nurse practitioner or PA.  Roland Rack, MD [facility] [phone]  If 7pm- 7am, please page neurology on call as listed in [place].

## 2022-09-28 NOTE — ED Notes (Signed)
Report called to 4N RN.

## 2022-09-28 NOTE — ED Provider Notes (Signed)
[facility] EMERGENCY DEPARTMENT Provider Note   CSN: [id number] Arrival date & time: 09/28/22  1920     History  No chief complaint on file.   Margaret Torres is a 76 y.o. female.  HPI Patient presented as code stroke.  Medic upon arrival by myself and Dr. Leonel Ramsay from neurology.  Had aphasia with last normal at 4:00.  Difficulty with speaking noticed at 445.   Past Medical History:  Diagnosis Date   Allergic rhinitis 07/16/2007   Carbon monoxide exposure    Dizziness    Dysphagia 03/02/2016   Essential hypertension 07/16/2007   First degree hemorrhoids 11/28/2020   Generalized abdominal pain 11/28/2020   GERD (gastroesophageal reflux disease) 10/12/2018   Hemorrhage of rectum and anus 11/28/2020   History of cervical cancer    HTN (hypertension)    Hyperlipidemia 06/26/2007   Hyperthyroidism 04/21/2016   hx of thyroid nodule   Insomnia 06/20/2014   Left anterior knee pain 09/30/2014   Leg pain, bilateral 03/02/2016   Major depressive disorder 07/16/2007   Mild cognitive impairment with memory loss 05/14/2022   Multiple thyroid nodules 12/30/2017   Olecranon bursitis of left elbow 01/30/2016   Primary osteoarthritis of both knees 10/20/2018   RBBB    Second degree hemorrhoids 11/28/2020   Vitamin D deficiency 03/19/2022   Wheezing 11/17/2020    Home Medications Prior to Admission medications   Medication Sig Start Date End Date Taking? Authorizing Provider  amLODipine (NORVASC) 5 MG tablet Take 1 tablet (5 mg total) by mouth daily as needed (SBP>140). 08/06/22   Finis Bud, NP  Cholecalciferol (VITAMIN D3) 10 MCG (400 UNIT) CAPS Take by mouth.    [provider]  CLENPIQ 10-3.5-12 MG-GM -GM/175ML SOLN Take by mouth. 06/22/22   [provider]  clonazePAM (KLONOPIN) 0.5 MG tablet Take 1 tablet (0.5 mg total) by mouth 2 (two) times daily as needed. 09/28/22   Midge Minium, MD  fluticasone Asencion Islam) 50 MCG/ACT  nasal spray Use 2 spray(s) in each nostril once daily 08/13/22   Midge Minium, MD  loratadine (CLARITIN) 10 MG tablet Take 1 tablet (10 mg total) by mouth daily as needed for allergies. Take 10 mg by mouth daily as needed for allergies. 10/26/21   Midge Minium, MD  meclizine (ANTIVERT) 25 MG tablet TAKE 1 TABLET BY MOUTH THREE TIMES DAILY AS NEEDED FOR DIZZINESS 09/20/22   Midge Minium, MD  olopatadine (PATADAY) 0.1 % ophthalmic solution 1 drop 2 (two) times daily.    [provider]  omeprazole (PRILOSEC) 20 MG capsule Take 20 mg by mouth 2 (two) times daily. 07/10/22   [provider]  permethrin (ELIMITE) 5 % cream Apply to affected area once 09/20/22   Midge Minium, MD  polyethylene glycol powder (GLYCOLAX/MIRALAX) 17 GM/SCOOP powder Take 17 g by mouth 2 (two) times daily as needed. 05/26/22   Teodora Medici, FNP  sucralfate (CARAFATE) 1 g tablet Take 1 tablet (1 g total) by mouth 3 (three) times daily with meals. 03/19/22   Midge Minium, MD  triamcinolone cream (KENALOG) 0.1 % Apply 1 Application topically 2 (two) times daily. 08/26/22   Francene Finders, PA-C  Vitamin D, Ergocalciferol, (DRISDOL) 1.25 MG (50000 UNIT) CAPS capsule Take 1 capsule (50,000 Units total) by mouth every 7 (seven) days. 02/08/22   Midge Minium, MD  Vitamin D, Ergocalciferol, (DRISDOL) 1.25 MG (50000 UNIT) CAPS capsule Take 1 capsule (50,000 Units total) by mouth  every 7 (seven) days. 06/03/22   Midge Minium, MD      Allergies    Pneumococcal vaccine and Influenza vaccines    Review of Systems   Review of Systems  Physical Exam Updated Vital Signs Wt 68.8 kg   LMP 06/30/2013   BMI 27.74 kg/m  Physical Exam Vitals and nursing note reviewed.  HENT:     Head: Atraumatic.  Cardiovascular:     Rate and Rhythm: Regular rhythm.  Pulmonary:     Breath sounds: No wheezing.  Abdominal:     Tenderness: There is no abdominal tenderness.  Musculoskeletal:      Cervical back: Neck supple.  Neurological:     Mental Status: She is alert.     Comments: Moving all extremities.  Face symmetric.  Has a rather dense expressive and change in addition receptive aphasia.  Complete NIH scoring done by neurology.     Labs (all labs ordered are listed, but only abnormal results are displayed) Labs Reviewed  I-STAT CHEM 8, ED - Abnormal; Notable for the following components:      Result Value   Potassium 3.4 (*)    Glucose, Bld 109 (*)    Calcium, Ion 1.10 (*)    Hemoglobin 11.9 (*)    HCT 35.0 (*)    All other components within normal limits  CBG MONITORING, ED - Abnormal; Notable for the following components:   Glucose-Capillary 120 (*)    All other components within normal limits  COMPREHENSIVE METABOLIC PANEL  ETHANOL  CBC  PROTIME-INR  APTT  DIFFERENTIAL    EKG None  Radiology CT HEAD CODE STROKE WO CONTRAST  Result Date: 09/28/2022 CLINICAL DATA:  Stroke suspected. EXAM: CT HEAD WITHOUT CONTRAST TECHNIQUE: Contiguous axial images were obtained from the base of the skull through the vertex without intravenous contrast. RADIATION DOSE REDUCTION: This exam was performed according to the departmental dose-optimization program which includes automated exposure control, adjustment of the mA and/or kV according to patient size and/or use of iterative reconstruction technique. COMPARISON:  CT head 11/08/2005 FINDINGS: Brain: There is a small area of hypodensity with loss of gray-white differentiation in the left frontal lobe suspicious for small area of infarct given findings on CTA/CTP. There is no hemorrhage or mass effect. Aspects is 9. Parenchymal volume is within expected limits for age. The ventricles are normal in size. Gray-white differentiation is preserved. There are small remote lacunar infarcts in the bilateral cerebellar hemispheres. Additional patchy hypodensity in the supratentorial white matter likely reflects sequela of chronic  small-vessel ischemic change There is no mass lesion there is no mass effect or midline shift. Vascular: See below. Skull: Normal. Negative for fracture or focal lesion. Sinuses/Orbits: The paranasal sinuses are clear. The globes and orbits are unremarkable. Other: None. Review of the MIP images confirms the above findings IMPRESSION: Small area of hypodensity in the left frontal lobe with loss of gray-white differentiation likely reflecting a small area of infarct given findings on CTA/CTP. Aspects is 9. Findings communicated to Dr. Leonel Ramsay at 7:55 p.m. Electronically Signed   By: Valetta Mole M.D.   On: 09/28/2022 20:01   CT ANGIO HEAD NECK W WO CM W PERF (CODE STROKE)  Result Date: 09/28/2022 CLINICAL DATA:  Acute neuro deficit.  Stroke suspected. EXAM: CT ANGIOGRAPHY HEAD AND NECK CT PERFUSION BRAIN TECHNIQUE: Multidetector CT imaging of the head and neck was performed using the standard protocol during bolus administration of intravenous contrast. Multiplanar CT image reconstructions and MIPs  were obtained to evaluate the vascular anatomy. Carotid stenosis measurements (when applicable) are obtained utilizing NASCET criteria, using the distal internal carotid diameter as the denominator. Multiphase CT imaging of the brain was performed following IV bolus contrast injection. Subsequent parametric perfusion maps were calculated using RAPID software. RADIATION DOSE REDUCTION: This exam was performed according to the departmental dose-optimization program which includes automated exposure control, adjustment of the mA and/or kV according to patient size and/or use of iterative reconstruction technique. CONTRAST:  153m OMNIPAQUE IOHEXOL 350 MG/ML SOLN COMPARISON:  CT head 09/28/2022 FINDINGS: CTA NECK FINDINGS Aortic arch: Bovine branching arch. Mild atherosclerotic calcification aortic arch without aneurysm. Proximal great vessels patent without stenosis. Right carotid system: Right carotid system widely  patent without stenosis or significant atherosclerotic disease. Left carotid system: Left carotid system widely patent without stenosis or significant atherosclerotic disease. Vertebral arteries: Both vertebral arteries patent to the skull base without stenosis. Left vertebral dominant. Skeleton: Cervical spondylosis. Multilevel spinal and foraminal stenosis due to spurring. Other neck: Negative for soft tissue mass or adenopathy in the neck. Upper chest: Lung apices clear bilaterally Review of the MIP images confirms the above findings CTA HEAD FINDINGS Anterior circulation: Internal carotid artery widely patent through the skull base and cavernous segment. Anterior cerebral arteries widely patent bilaterally. Right middle cerebral artery widely patent Left M1 segment patent. There is occlusion of a left M3 branch involving the inferior division. This a relatively large branch. This appears acute. Posterior circulation: Both vertebral arteries patent to the basilar without stenosis. PICA patent bilaterally. Basilar widely patent. Superior cerebellar and posterior cerebral arteries patent bilaterally without stenosis. Venous sinuses: Normal venous enhancement CT Brain Perfusion Findings: ASPECTS: 10 CBF (<30%) Volume: 591mPerfusion (Tmax>6.0s) volume: 188mismatch Volume: 33m26mfarction Location:Left parietal lobe IMPRESSION: 1. Acute occlusion of a left M3 branch involving the inferior division. CT perfusion demonstrates a 5 mL core infarct in the left parietal lobe with 13 mL of surrounding penumbra. 2. No significant carotid or vertebral artery stenosis in the neck. 3. Aortic atherosclerosis. 4. Code stroke imaging results were communicated on 09/28/2022 at 7:52 pm to provider KirkLeonel Ramsay telephone Aortic Atherosclerosis (ICD10-I70.0). Electronically Signed   By: CharFranchot Gallo.   On: 09/28/2022 19:57    Procedures Procedures    Medications Ordered in ED Medications  sodium chloride flush (NS)  0.9 % injection 3 mL (has no administration in time range)  tenecteplase (TNKASE) injection for Stroke 17 mg (has no administration in time range)  labetalol (NORMODYNE) injection 10 mg (has no administration in time range)  iohexol (OMNIPAQUE) 350 MG/ML injection 100 mL (100 mLs Intravenous Contrast Given 09/28/22 1943)    ED Course/ Medical Decision Making/ A&P                           Medical Decision Making Amount and/or Complexity of Data Reviewed Labs: ordered. Radiology: ordered.  Risk Decision regarding hospitalization.   Patient presented as a code stroke.  Dense aphasia which would be debilitating.  Dr. KirkLeonel Ramsayommended TNK for stroke.  He had a long discussion with family members.  Aware of risk.  Independently interpreted head CT.  Differential diagnosis include stroke and other cause of neurodeficits but stroke considered most likely.  CRITICAL CARE Performed by: NathDavonna Bellingal critical care time: 30 minutes Critical care time was exclusive of separately billable procedures and treating other patients. Critical care was necessary to treat or prevent imminent or  life-threatening deterioration. Critical care was time spent personally by me on the following activities: development of treatment plan with patient and/or surrogate as well as nursing, discussions with consultants, evaluation of patient's response to treatment, examination of patient, obtaining history from patient or surrogate, ordering and performing treatments and interventions, ordering and review of laboratory studies, ordering and review of radiographic studies, pulse oximetry and re-evaluation of patient's condition.         Final Clinical Impression(s) / ED Diagnoses Final diagnoses:  Cerebrovascular accident (CVA), unspecified mechanism [facility])    Rx / [place] Orders ED Discharge Orders     None         Davonna Belling, MD 09/28/22 2111

## 2022-09-28 NOTE — Progress Notes (Signed)
PHARMACIST CODE STROKE RESPONSE  Notified to mix TNK at 19:54 by Dr. Leonel Ramsay  TNK preparation completed at 19:57  TNK dose = 17 mg IV over 5 seconds  Issues/delays encountered (if applicable): BP control, family consent   Esmeralda Arthur, PharmD, BCCCP  09/28/22 8:02 PM

## 2022-09-28 NOTE — Telephone Encounter (Signed)
I don't know why it was requested at a lower quantity.  I signed it as it came to me.  I have re-sent the medication w/ #60 and 1 refill as we usually do

## 2022-09-28 NOTE — ED Notes (Signed)
Leonel Ramsay, MD notified of patient passing swallow study

## 2022-09-28 NOTE — ED Triage Notes (Signed)
Patient arrives via EMS for slurred speech, code stroked was called. Patient was seen at baseline at 1630 by family. Family states that patient is independent and lives alone so the slurred speech was something new.

## 2022-09-28 NOTE — Telephone Encounter (Signed)
LM to inform the pt this has been sent for fill

## 2022-09-29 ENCOUNTER — Inpatient Hospital Stay (HOSPITAL_COMMUNITY): Payer: Medicare HMO

## 2022-09-29 DIAGNOSIS — I6389 Other cerebral infarction: Secondary | ICD-10-CM

## 2022-09-29 DIAGNOSIS — I63412 Cerebral infarction due to embolism of left middle cerebral artery: Secondary | ICD-10-CM

## 2022-09-29 LAB — LIPID PANEL
Cholesterol: 133 mg/dL (ref 0–200)
HDL: 40 mg/dL — ABNORMAL LOW (ref >40)
LDL Cholesterol: 85 mg/dL (ref 0–99)
Total CHOL/HDL Ratio: 3.3 RATIO
Triglycerides: 40 mg/dL (ref <150)
VLDL: 8 mg/dL (ref 0–40)

## 2022-09-29 LAB — HEMOGLOBIN A1C
Hgb A1c MFr Bld: 5.6 % (ref 4.8–5.6)
Mean Plasma Glucose: 114 mg/dL

## 2022-09-29 LAB — ECHOCARDIOGRAM COMPLETE
Area-P 1/2: 3.09 cm2
Calc EF: 54.8 %
Height: 62 in
S' Lateral: 2.9 cm
Single Plane A2C EF: 62.9 %
Single Plane A4C EF: 45.9 %
Weight: 2246.93 oz

## 2022-09-29 MED ORDER — CHLORHEXIDINE GLUCONATE CLOTH 2 % EX PADS
6.0000 | MEDICATED_PAD | Freq: Every day | CUTANEOUS | Status: DC
  Administered 2022-09-29 – 2022-09-30 (×2): 6 via TOPICAL

## 2022-09-29 MED ORDER — HYDROXYZINE HCL 10 MG PO TABS
10.0000 mg | ORAL_TABLET | Freq: Three times a day (TID) | ORAL | Status: DC | PRN
  Administered 2022-09-29: 10 mg via ORAL
  Filled 2022-09-29 (×2): qty 1

## 2022-09-29 MED ORDER — LORATADINE 10 MG PO TABS: 10.0000 mg | ORAL_TABLET | Freq: Every day | ORAL | Status: DC | PRN

## 2022-09-29 MED ORDER — SUCRALFATE 1 G PO TABS
1.0000 g | ORAL_TABLET | Freq: Three times a day (TID) | ORAL | Status: DC
  Filled 2022-09-29 (×2): qty 1

## 2022-09-29 MED ORDER — PANTOPRAZOLE SODIUM 40 MG PO TBEC
40.0000 mg | DELAYED_RELEASE_TABLET | Freq: Every day | ORAL | Status: DC
  Administered 2022-09-29: 40 mg via ORAL
  Filled 2022-09-29 (×2): qty 1

## 2022-09-29 MED ORDER — MECLIZINE HCL 25 MG PO TABS: 25.0000 mg | ORAL_TABLET | Freq: Three times a day (TID) | ORAL | Status: DC | PRN

## 2022-09-29 MED ORDER — CLONAZEPAM 0.5 MG PO TABS
0.5000 mg | ORAL_TABLET | Freq: Two times a day (BID) | ORAL | Status: DC | PRN
  Administered 2022-09-29 – 2022-09-30 (×2): 0.5 mg via ORAL
  Filled 2022-09-29 (×2): qty 1

## 2022-09-29 MED ORDER — LORAZEPAM 2 MG/ML IJ SOLN
1.0000 mg | Freq: Once | INTRAMUSCULAR | Status: AC | PRN
  Administered 2022-09-29: 1 mg via INTRAVENOUS
  Filled 2022-09-29: qty 1

## 2022-09-29 MED ORDER — ATORVASTATIN CALCIUM 40 MG PO TABS
40.0000 mg | ORAL_TABLET | Freq: Every day | ORAL | Status: DC
  Filled 2022-09-29: qty 1

## 2022-09-29 MED ORDER — FLUTICASONE PROPIONATE 50 MCG/ACT NA SUSP
1.0000 | Freq: Every day | NASAL | Status: DC
  Administered 2022-09-30: 1 via NASAL
  Filled 2022-09-29: qty 16

## 2022-09-29 NOTE — Code Documentation (Signed)
Stroke Response Nurse Documentation Code Documentation  Margaret Torres is a 76 y.o. female arriving to [facility]  via [facility] on 12/19 with past medical hx of HTN, HLD, cervical cancer. On No antithrombotic. Code stroke was activated by EMS.   Patient from home where she was LKW at 1600 and now complaining of aphasia.    Stroke team at the bedside on patient arrival. Labs drawn and patient cleared for CT by Dr. Alvino Chapel. Patient to CT with team. NIHSS 6, see documentation for details and code stroke times. Patient with disoriented, not following commands, Global aphasia , and dysarthria  on exam. The following imaging was completed:  CT Head and CTA. Patient is a candidate for IV Thrombolytic due to fixed neurological deficit. Patient is not a candidate for IR due to distal occlusion.   Care Plan: TNKase.   Bedside handoff with ED RN Ander Purpura.    Madelynn Done  Rapid Response RN

## 2022-09-29 NOTE — Evaluation (Signed)
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   Irregular heart beat 06/05/2022   Carbon monoxide exposure    Mild cognitive impairment with memory loss 05/14/2022   Vitamin D deficiency 03/19/2022   Abnormal feces 11/28/2020   First degree hemorrhoids 11/28/2020   Generalized abdominal pain 11/28/2020   Second degree hemorrhoids 11/28/2020   Hemorrhage of rectum and anus 11/28/2020   Wheezing 11/17/2020   Primary osteoarthritis of both knees 10/20/2018   GERD (gastroesophageal reflux disease) 10/12/2018   Multiple thyroid nodules 12/30/2017   Diarrhea 09/20/2016   Hyperthyroidism 04/21/2016   Bowel incontinence 04/21/2016   Leg pain, bilateral 03/02/2016   Dysphagia 03/02/2016   Olecranon bursitis of left elbow 01/30/2016   Physical exam 01/27/2015   Bilateral renal cysts 01/27/2015   Left anterior knee pain 09/30/2014   Insomnia 06/20/2014   Hemorrhoids, external 03/27/2014   Major depressive disorder 07/16/2007   Essential hypertension 07/16/2007   Allergic rhinitis 07/16/2007   Hyperlipidemia 06/26/2007   TAH/BSO, HX OF 06/26/2007   Past Medical History:  Past Medical History:  Diagnosis Date   Allergic rhinitis 07/16/2007   Carbon monoxide exposure    Dizziness    Dysphagia 03/02/2016   Essential hypertension 07/16/2007   First degree hemorrhoids 11/28/2020   Generalized abdominal pain 11/28/2020   GERD (gastroesophageal reflux disease) 10/12/2018   Hemorrhage of rectum and anus 11/28/2020   History of cervical cancer    HTN (hypertension)    Hyperlipidemia 06/26/2007   Hyperthyroidism  04/21/2016   hx of thyroid nodule   Insomnia 06/20/2014   Left anterior knee pain 09/30/2014   Leg pain, bilateral 03/02/2016   Major depressive disorder 07/16/2007   Mild cognitive impairment with memory loss 05/14/2022   Multiple thyroid nodules 12/30/2017   Olecranon bursitis of left elbow 01/30/2016   Primary osteoarthritis of both knees 10/20/2018   RBBB    Second degree hemorrhoids 11/28/2020   Vitamin D deficiency 03/19/2022   Wheezing 11/17/2020   Past Surgical History:  Past Surgical History:  Procedure Laterality Date   ABDOMINAL HYSTERECTOMY     WISDOM TOOTH EXTRACTION Bilateral    HPI:  Margaret Torres is a 76 y.o. female with a history of hypertension, hyperlipidemia who presents with acute aphasia.  Recieved IV TNK   Assessment / Plan / Recommendation Clinical Impression  Pt demonstrates mild residual impairment after significant improvement in language after TNK. Pt has intermittent phonemic paraphasias in conversation that she recognizes and slef corrects. Otherwise comprehension, verbal expression, speech and reading are in tact. Recommend f/u at OP SLP. Acute SLP will sign off.    SLP Assessment  SLP Recommendation/Assessment: All further Speech Lanaguage Pathology  needs can be addressed in the next venue of care SLP Visit Diagnosis: Aphasia (R47.01)    Recommendations for follow up therapy are one component of a multi-disciplinary discharge planning process, led by the attending physician.  Recommendations may be updated based on patient status, additional functional criteria and insurance authorization.    Follow Up Recommendations  Outpatient SLP    Assistance Recommended at Discharge     Functional Status Assessment  Frequency and Duration           SLP Evaluation Cognition  Overall Cognitive Status: Within Functional Limits for tasks assessed Orientation Level: Oriented X4       Comprehension  Visual  Recognition/Discrimination Discrimination: Within Function Limits Reading Comprehension Reading Status: Within funtional limits    Expression Verbal Expression Overall Verbal Expression: Impaired (mild phonemic paraphasias in conversation)   Oral / Motor  Oral Motor/Sensory Function Overall Oral Motor/Sensory Function: Within functional limits Motor Speech Overall Motor Speech: Appears within functional limits for tasks assessed            Fabrizzio Marcella, Katherene Ponto 09/29/2022, 11:58 AM

## 2022-09-29 NOTE — Progress Notes (Signed)
  Transition of Care [facility]) Screening Note   Patient Details  Name: Margaret Torres Date of Birth: [birth year]   Transition of Care [facility]) CM/SW Contact:    Benard Halsted, LCSW Phone Number: 09/29/2022, 9:20 AM    Transition of Care Department [facility]) has reviewed patient from home alone. We will continue to monitor patient advancement through interdisciplinary progression rounds. If new patient transition needs arise, please place a TOC consult.

## 2022-09-29 NOTE — Progress Notes (Signed)
PT Cancellation Note  Patient Details Name: Margaret Torres MRN: [id number] DOB: 12/16/1945   Cancelled Treatment:    Reason Eval/Treat Not Completed: Medical issues which prohibited therapy.  Bedrest orders still in place.  Please contact PT by phone [phone] or chat when bedrest relaxed. 09/29/2022  Ginger Carne., PT Acute Rehabilitation Services [phone]  (office)   Tessie Fass Finley Chevez 09/29/2022, 10:03 AM

## 2022-09-29 NOTE — Evaluation (Signed)
Physical Therapy Evaluation Patient Details Name: Margaret Torres MRN: [id number] DOB: 1945-11-04 Today's Date: 09/29/2022  History of Present Illness  76 yo female s/p TNK 12/19 M3 occlusion L MCA with acute aphasia  PMH HTN HLD hx of cervical CA, insomnia, major depressive disorder, STM loss, RBBB,  Clinical Impression  Pt admitted with/for stoke with aphasia.  Though mobility is not at baseline, pt is improving toward independence.  Pt currently limited functionally due to the problems listed below.  (see problems list.)  Pt will benefit from PT to maximize function and safety to be able to get home safely with available assist .        Recommendations for follow up therapy are one component of a multi-disciplinary discharge planning process, led by the attending physician.  Recommendations may be updated based on patient status, additional functional criteria and insurance authorization.  Follow Up Recommendations Home health PT      Assistance Recommended at Discharge Set up Supervision/Assistance  Patient can return home with the following  A little help with bathing/dressing/bathroom    Equipment Recommendations Other (comment) (TBD during HHPT/OT)  Recommendations for Other Services       Functional Status Assessment Patient has had a recent decline in their functional status and demonstrates the ability to make significant improvements in function in a reasonable and predictable amount of time.     Precautions / Restrictions Precautions Precautions: Fall Precaution Comments: x3 falls 12/19      Mobility  Bed Mobility Overal bed mobility: Modified Independent                  Transfers Overall transfer level: Needs assistance Equipment used: 1 person hand held assist Transfers: Sit to/from Stand Sit to Stand: Min assist           General transfer comment: requires steady (A) and reaching for environmental supports    Ambulation/Gait Ambulation/Gait  assistance: Min assist Gait Distance (Feet): 150 Feet (x2) Assistive device: IV Pole Gait Pattern/deviations: Step-through pattern   Gait velocity interpretation: <1.31 ft/sec, indicative of household ambulator   General Gait Details: generally steady holding to the IV pole, still a little guarded.  Stairs            Wheelchair Mobility    Modified Rankin (Stroke Patients Only) Modified Rankin (Stroke Patients Only) Pre-Morbid Rankin Score: No symptoms Modified Rankin: Slight disability     Balance Overall balance assessment: Mild deficits observed, not formally tested                                           Pertinent Vitals/Pain Pain Assessment Pain Assessment: Faces Faces Pain Scale: No hurt    Home Living Family/patient expects to be discharged to:: Private residence Living Arrangements: Alone Available Help at Discharge: Family;Available PRN/intermittently (call Daughter) Type of Home: House Home Access: Stairs to enter       Home Layout: Able to live on main level with bedroom/bathroom;Two level Home Equipment: None      Prior Function Prior Level of Function : Independent/Modified Independent;Driving               ADLs Comments: likes to fish as hobby, completes yard work indep     Journalist, newspaper   Dominant Hand: Right    Extremity/Trunk Assessment   Upper Extremity Assessment Upper Extremity Assessment: Overall WFL for tasks assessed  Lower Extremity Assessment Lower Extremity Assessment: Generalized weakness;Overall WFL for tasks assessed (baseline knee pain)    Cervical / Trunk Assessment Cervical / Trunk Assessment: Normal  Communication   Communication: Expressive difficulties  Cognition Arousal/Alertness: Awake/alert Behavior During Therapy: WFL for tasks assessed/performed Overall Cognitive Status: Within Functional Limits for tasks assessed                                           General Comments General comments (skin integrity, edema, etc.): vss RA    Exercises     Assessment/Plan    PT Assessment Patient needs continued PT services  PT Problem List Decreased activity tolerance;Decreased balance;Decreased mobility       PT Treatment Interventions Gait training;Stair training;Functional mobility training;Therapeutic activities;Balance training;Patient/family education;Neuromuscular re-education    PT Goals (Current goals can be found in the Care Plan section)  Acute Rehab PT Goals Patient Stated Goal: get back to PLOF, fishing PT Goal Formulation: With patient Time For Goal Achievement: 10/06/22 Potential to Achieve Goals: Good    Frequency Min 3X/week     Co-evaluation PT/OT/SLP Co-Evaluation/Treatment: Yes Reason for Co-Treatment: For patient/therapist safety PT goals addressed during session: Mobility/safety with mobility OT goals addressed during session: ADL's and self-care;Proper use of Adaptive equipment and DME;Strengthening/ROM       AM-PAC PT "6 Clicks" Mobility  Outcome Measure Help needed turning from your back to your side while in a flat bed without using bedrails?: A Little Help needed moving from lying on your back to sitting on the side of a flat bed without using bedrails?: A Little Help needed moving to and from a bed to a chair (including a wheelchair)?: A Little Help needed standing up from a chair using your arms (e.g., wheelchair or bedside chair)?: A Little Help needed to walk in hospital room?: A Little Help needed climbing 3-5 steps with a railing? : A Little 6 Click Score: 18    End of Session   Activity Tolerance: Patient tolerated treatment well Patient left: in bed;with call bell/phone within reach Nurse Communication: Mobility status PT Visit Diagnosis: Unsteadiness on feet (R26.81);Other symptoms and signs involving the nervous system (R29.898)    Time: 6269-4854 PT Time Calculation (min) (ACUTE ONLY): 37  min   Charges:   PT Evaluation $PT Eval Low Complexity: 1 Low          09/29/2022  Ginger Carne., PT Acute Rehabilitation Services [phone]  (office)  Tessie Fass Irineo Gaulin 09/29/2022, 5:01 PM

## 2022-09-29 NOTE — Progress Notes (Addendum)
STROKE TEAM PROGRESS NOTE   INTERVAL HISTORY Her daughter is at the bedside.  Her aphasia improved shortly after TNK injection.  MRI scheduled for 6pm tonight. 1 mg ativan PRN for claustrophobia. Clonazepam and atarax ordered PRN as well as home allergy medicine..  Blood pressure adequately controlled.  Vital signs stable.  Vitals:   09/29/22 0630 09/29/22 0700 09/29/22 0800 09/29/22 0900  BP: (!) 145/78 133/72 (!) 140/84 130/82  Pulse: 66 (!) 54 (!) 58 68  Resp: '16 17 12 12  '$ Temp:   98 F (36.7 C)   TempSrc:   Oral   SpO2: 98% 98% 99% 98%  Weight:      Height:       CBC:  Recent Labs  Lab 09/23/22 0854 09/28/22 1928 09/28/22 2152  WBC 5.3  --  8.8  NEUTROABS  --   --  6.5  HGB 11.4* 11.9* 11.5*  HCT 33.1* 35.0* 33.3*  MCV 89.2  --  88.3  PLT 284  --  102   Basic Metabolic Panel:  Recent Labs  Lab 09/23/22 0854 09/28/22 1925 09/28/22 1928  NA 137 138 140  K 3.6 3.5 3.4*  CL 104 107 104  CO2 25 21*  --   GLUCOSE 94 108* 109*  BUN '11 13 15  '$ CREATININE 1.03* 1.05* 1.00  CALCIUM 8.7* 8.9  --    Lipid Panel:  Recent Labs  Lab 09/29/22 0351  CHOL 133  TRIG 40  HDL 40*  CHOLHDL 3.3  VLDL 8  LDLCALC 85   HgbA1c: No results for input(s): "HGBA1C" in the last 168 hours. Urine Drug Screen: No results for input(s): "LABOPIA", "COCAINSCRNUR", "LABBENZ", "AMPHETMU", "THCU", "LABBARB" in the last 168 hours.  Alcohol Level  Recent Labs  Lab 09/28/22 1925  ETH <10    IMAGING past 24 hours CT HEAD CODE STROKE WO CONTRAST  Result Date: 09/28/2022 CLINICAL DATA:  Stroke suspected. EXAM: CT HEAD WITHOUT CONTRAST TECHNIQUE: Contiguous axial images were obtained from the base of the skull through the vertex without intravenous contrast. RADIATION DOSE REDUCTION: This exam was performed according to the departmental dose-optimization program which includes automated exposure control, adjustment of the mA and/or kV according to patient size and/or use of iterative  reconstruction technique. COMPARISON:  CT head 11/08/2005 FINDINGS: Brain: There is a small area of hypodensity with loss of gray-white differentiation in the left frontal lobe suspicious for small area of infarct given findings on CTA/CTP. There is no hemorrhage or mass effect. Aspects is 9. Parenchymal volume is within expected limits for age. The ventricles are normal in size. Gray-white differentiation is preserved. There are small remote lacunar infarcts in the bilateral cerebellar hemispheres. Additional patchy hypodensity in the supratentorial white matter likely reflects sequela of chronic small-vessel ischemic change There is no mass lesion there is no mass effect or midline shift. Vascular: See below. Skull: Normal. Negative for fracture or focal lesion. Sinuses/Orbits: The paranasal sinuses are clear. The globes and orbits are unremarkable. Other: None. Review of the MIP images confirms the above findings IMPRESSION: Small area of hypodensity in the left frontal lobe with loss of gray-white differentiation likely reflecting a small area of infarct given findings on CTA/CTP. Aspects is 9. Findings communicated to Dr. Leonel Ramsay at 7:55 p.m. Electronically Signed   By: Valetta Mole M.D.   On: 09/28/2022 20:01   CT ANGIO HEAD NECK W WO CM W PERF (CODE STROKE)  Result Date: 09/28/2022 CLINICAL DATA:  Acute neuro deficit.  Stroke  suspected. EXAM: CT ANGIOGRAPHY HEAD AND NECK CT PERFUSION BRAIN TECHNIQUE: Multidetector CT imaging of the head and neck was performed using the standard protocol during bolus administration of intravenous contrast. Multiplanar CT image reconstructions and MIPs were obtained to evaluate the vascular anatomy. Carotid stenosis measurements (when applicable) are obtained utilizing NASCET criteria, using the distal internal carotid diameter as the denominator. Multiphase CT imaging of the brain was performed following IV bolus contrast injection. Subsequent parametric perfusion  maps were calculated using RAPID software. RADIATION DOSE REDUCTION: This exam was performed according to the departmental dose-optimization program which includes automated exposure control, adjustment of the mA and/or kV according to patient size and/or use of iterative reconstruction technique. CONTRAST:  170m OMNIPAQUE IOHEXOL 350 MG/ML SOLN COMPARISON:  CT head 09/28/2022 FINDINGS: CTA NECK FINDINGS Aortic arch: Bovine branching arch. Mild atherosclerotic calcification aortic arch without aneurysm. Proximal great vessels patent without stenosis. Right carotid system: Right carotid system widely patent without stenosis or significant atherosclerotic disease. Left carotid system: Left carotid system widely patent without stenosis or significant atherosclerotic disease. Vertebral arteries: Both vertebral arteries patent to the skull base without stenosis. Left vertebral dominant. Skeleton: Cervical spondylosis. Multilevel spinal and foraminal stenosis due to spurring. Other neck: Negative for soft tissue mass or adenopathy in the neck. Upper chest: Lung apices clear bilaterally Review of the MIP images confirms the above findings CTA HEAD FINDINGS Anterior circulation: Internal carotid artery widely patent through the skull base and cavernous segment. Anterior cerebral arteries widely patent bilaterally. Right middle cerebral artery widely patent Left M1 segment patent. There is occlusion of a left M3 branch involving the inferior division. This a relatively large branch. This appears acute. Posterior circulation: Both vertebral arteries patent to the basilar without stenosis. PICA patent bilaterally. Basilar widely patent. Superior cerebellar and posterior cerebral arteries patent bilaterally without stenosis. Venous sinuses: Normal venous enhancement CT Brain Perfusion Findings: ASPECTS: 10 CBF (<30%) Volume: 521mPerfusion (Tmax>6.0s) volume: 1834mismatch Volume: 12m25mfarction Location:Left parietal lobe  IMPRESSION: 1. Acute occlusion of a left M3 branch involving the inferior division. CT perfusion demonstrates a 5 mL core infarct in the left parietal lobe with 13 mL of surrounding penumbra. 2. No significant carotid or vertebral artery stenosis in the neck. 3. Aortic atherosclerosis. 4. Code stroke imaging results were communicated on 09/28/2022 at 7:52 pm to provider KirkLeonel Ramsay telephone Aortic Atherosclerosis (ICD10-I70.0). Electronically Signed   By: CharFranchot Gallo.   On: 09/28/2022 19:57    PHYSICAL EXAM Constitutional: Appears frail elderly African-American lady cardiovascular: Normal rate and regular rhythm.  Respiratory: Effort normal and breath sounds normal to anterior ascultation   Neuro: Mental Status: Patient is awake, alert, expressive and receptive aphasia is improved.  Still has some word finding difficulties and paraphasic errors. Accurately identifies objects.  Cranial Nerves: II: Visual Fields are full. Pupils are equal, round, and reactive to light.   III,IV, VI: EOMI without ptosis or diploplia.  V: Facial sensation is symmetric to temperature VII: Facial movement is symmetric.  VIII: hearing is intact to voice X: Uvula elevates symmetrically XI: Shoulder shrug is symmetric. XII: tongue is midline without atrophy or fasciculations.  Motor: Tone is normal. Bulk is normal. 5/5 strength was present in all four extremities.  Sensory: Sensation is symmetric to light touch and temperature in the arms and legs. Cerebellar: No clear ataxia on finger-nose-finger, but slowed on the right.   ASSESSMENT/PLAN Ms. MagdChristyl Osentoskia 76 y75. female with history of HTN, HLD presenting  with aphasia.  She received TNK in the ED.  CTA head and neck showed an occlusion of her left M3 branch involving the inferior division.  She is not a candidate for mechanical thrombectomy due to distal occlusion.  Repeat imaging later today and then plan for DAPT therapy with aspirin and  Plavix for 3 weeks and then aspirin 81 mg alone.  Ms. Amrhein tells [name] that she takes aspirin 3 times per week and we explained to the importance of her taking this medication every day now that she has had this event.  She is in agreement  Stroke:  Left MCA territory branch infarct  Etiology:  M3 occlusion s/p TNK  Code Stroke CT head Small area of hypodensity in the left frontal lobe with loss of gray-white differentiation likely reflecting a small area of infarct. Aspects 9 CTA head & neck  Acute occlusion of a left M3 branch involving the inferior division.  CT perfusion CT perfusion demonstrates a 5 mL core infarct in the left parietal lobe with 13 mL of surrounding penumbra. MRI  Pending 2D Echo Pending LDL 85 HgbA1c 5.6 VTE prophylaxis - SCDs    Diet   Diet Heart Room service appropriate? Yes with Assist; Fluid consistency: Thin   Aspirin '81mg'$  3 times per week prior to admission, now on aspirin 81 mg daily and clopidogrel 75 mg daily for 3 weeks and then Aspirin '81mg'$  alone for life once 24 hours post TNK Therapy recommendations: Pending Disposition: Pending  Hypertension Home meds: Amlodipine as needed Stable BP goal less than 180  Hyperlipidemia Home meds: None LDL 85, goal < 70 Add atorvastatin 40 mg Continue statin at discharge  Other Stroke Risk Factors Advanced Age >/= 41   Other Active Problems Anxiety-she was recently seen by our psychiatry team in the ED on 12/15.  This note was reviewed. Clonazepam resumed Add Atarax for itching GERD Resume omeprazole and [facility] day # 1  Patient seen and examined by NP/APP with MD. MD to update note as needed.   Janine Ores, DNP, FNP-BC [facility] Pager: [phone]  STROKE MD NOTE :  I have personally obtained history,examined this patient, reviewed notes, independently viewed imaging studies, participated in medical decision making and plan of care.ROS completed by me personally and  pertinent positives fully documented  I have made any additions or clarifications directly to the above note. Agree with note above.  She presented with expressive aphasia secondary to left MCA branch infarct due to left M3 occlusion and was treated with IV TNK with excellent clinical recovery.  Recommend close neurological monitoring and strict blood pressure control as per post TNK protocol.  Continue ongoing stroke workup.  Check 24 post TNK brain imaging and if no bleed start aspirin and Plavix for 3 weeks followed by aspirin alone.  Aggressive risk factor modification.  Long discussion with patient and daughter at the bedside and answered questions.This patient is critically ill and at significant risk of neurological worsening, death and care requires constant monitoring of vital signs, hemodynamics,respiratory and cardiac monitoring, extensive review of multiple databases, frequent neurological assessment, discussion with family, other specialists and medical decision making of high complexity.I have made any additions or clarifications directly to the above note.This critical care time does not reflect procedure time, or teaching time or supervisory time of PA/NP/Med Resident etc but could involve care discussion time.  I spent 30 minutes of neurocritical care time  in the care of  this patient.  Antony Contras, MD Medical Director [facility] Pager: [phone] 09/29/2022 2:40 PM  To contact Stroke Continuity provider, please refer to [email or web address] After hours, contact General Neurology

## 2022-09-29 NOTE — Evaluation (Signed)
Occupational Therapy Evaluation Patient Details Name: Margaret Torres MRN: [id number] DOB: 03/02/1946 Today's Date: 09/29/2022   History of Present Illness 76 yo female s/p TNK 12/19 M3 occlusion L MCA with acute aphasia  PMH HTN HLD hx of cervical CA, insomnia, major depressive disorder, STM loss, RBBB,   Clinical Impression   PT admitted with L MCA s/p TNK. Pt currently with functional limitiations due to the deficits listed below (see OT problem list). Pt currently with mild balance deficits and cognitive deficits at this time. Pt with incontinence of bowel and bladder lacking awareness. Pt could benefit from adult brief education next session and bowel/ bladder program to help with decreased incontinence.  Pt will benefit from skilled OT to increase their independence and safety with adls and balance to allow discharge hhot.       Recommendations for follow up therapy are one component of a multi-disciplinary discharge planning process, led by the attending physician.  Recommendations may be updated based on patient status, additional functional criteria and insurance authorization.   Follow Up Recommendations  Home health OT     Assistance Recommended at Discharge Set up Supervision/Assistance  Patient can return home with the following Assist for transportation;Direct supervision/assist for financial management;Direct supervision/assist for medications management    Functional Status Assessment  Patient has had a recent decline in their functional status and demonstrates the ability to make significant improvements in function in a reasonable and predictable amount of time.  Equipment Recommendations  None recommended by OT;Other (comment) (HHOT to help with grab bars in bathroom)    Recommendations for Other Services       Precautions / Restrictions Precautions Precautions: Fall Precaution Comments: x3 falls 12/19      Mobility Bed Mobility Overal bed mobility: Modified  Independent                  Transfers Overall transfer level: Needs assistance Equipment used: 1 person hand held assist Transfers: Sit to/from Stand Sit to Stand: Min assist           General transfer comment: requires steady (A) and reaching for environmental supports      Balance Overall balance assessment: Mild deficits observed, not formally tested                                         ADL either performed or assessed with clinical judgement   ADL Overall ADL's : Needs assistance/impaired Eating/Feeding: Moderate assistance   Grooming: Wash/dry hands;Supervision/safety;Sitting   Upper Body Bathing: Supervision/ safety   Lower Body Bathing: Minimal assistance   Upper Body Dressing : Supervision/safety   Lower Body Dressing: Minimal assistance   Toilet Transfer: Minimal assistance   Toileting- Clothing Manipulation and Hygiene: Moderate assistance       Functional mobility during ADLs: Minimal assistance General ADL Comments: pt incontinence of bowel and lack of awareness. pt with urinary urgency     Vision Baseline Vision/History: 1 Wears glasses Ability to See in Adequate Light: 0 Adequate Patient Visual Report: No change from baseline Additional Comments: wears glasses for reading     Perception     Praxis      Pertinent Vitals/Pain       Hand Dominance Right   Extremity/Trunk Assessment Upper Extremity Assessment Upper Extremity Assessment: Overall WFL for tasks assessed   Lower Extremity Assessment Lower Extremity Assessment: Generalized weakness (reports baseline knee  pain)   Cervical / Trunk Assessment Cervical / Trunk Assessment: Normal   Communication Communication Communication: Expressive difficulties   Cognition Arousal/Alertness: Awake/alert Behavior During Therapy: WFL for tasks assessed/performed Overall Cognitive Status: Within Functional Limits for tasks assessed                                        General Comments  VSS RA    Exercises     Shoulder Instructions      Home Living Family/patient expects to be discharged to:: Private residence Living Arrangements: Alone Available Help at Discharge: Family;Available PRN/intermittently (call Daughter) Type of Home: House Home Access: Stairs to enter     Home Layout: Able to live on main level with bedroom/bathroom;Two level     Bathroom Shower/Tub: Tub/shower unit;Door   [employer] Toilet: Standard     Home Equipment: None          Prior Functioning/Environment Prior Level of Function : Independent/Modified Independent;Driving               ADLs Comments: likes to fish as hobby, completes yard work indep        OT Problem List: Decreased strength;Decreased activity tolerance;Impaired balance (sitting and/or standing);Decreased cognition      OT Treatment/Interventions: Self-care/ADL training;Therapeutic exercise;Neuromuscular education;Energy conservation;DME and/or AE instruction;Manual therapy;Therapeutic activities;Cognitive remediation/compensation;Patient/family education;Balance training    OT Goals(Current goals can be found in the care plan section) Acute Rehab OT Goals Patient Stated Goal: to be able to return to living at home alone OT Goal Formulation: With patient Time For Goal Achievement: 10/13/22 Potential to Achieve Goals: Good  OT Frequency: Min 2X/week    Co-evaluation PT/OT/SLP Co-Evaluation/Treatment: Yes Reason for Co-Treatment: For patient/therapist safety;Necessary to address cognition/behavior during functional activity   OT goals addressed during session: ADL's and self-care;Proper use of Adaptive equipment and DME;Strengthening/ROM      AM-PAC OT "6 Clicks" Daily Activity     Outcome Measure Help from another person eating meals?: None Help from another person taking care of personal grooming?: None Help from another person toileting, which includes  using toliet, bedpan, or urinal?: A Little Help from another person bathing (including washing, rinsing, drying)?: A Little Help from another person to put on and taking off regular upper body clothing?: None Help from another person to put on and taking off regular lower body clothing?: A Little 6 Click Score: 21   End of Session Equipment Utilized During Treatment: Gait belt Nurse Communication: Mobility status;Precautions  Activity Tolerance: Patient tolerated treatment well Patient left: in bed;with call bell/phone within reach;with family/visitor present  OT Visit Diagnosis: Unsteadiness on feet (R26.81);Muscle weakness (generalized) (M62.81)                Time: 4765-4650 OT Time Calculation (min): 32 min Charges:  OT General Charges $OT Visit: 1 Visit OT Evaluation $OT Eval Moderate Complexity: 1 Mod   Brynn, OTR/L  Acute Rehabilitation Services Office: [phone] .   Jeri Modena 09/29/2022, 4:03 PM

## 2022-09-29 NOTE — Progress Notes (Signed)
OT Cancellation Note  Patient Details Name: Imaan Padgett MRN: [id number] DOB: May 23, 1946   Cancelled Treatment:    Reason Eval/Treat Not Completed: Active bedrest order OT order received and appreciated however this conflicts with current bedrest order set. Please increase activity tolerance as appropriate and remove bedrest from orders. . Please contact OT at [phone] if bed rest order is discontinued. OT will hold evaluation at this time and will check back as time allows pending increased activity orders.   Jeri Modena 09/29/2022, 8:29 AM

## 2022-09-29 NOTE — Plan of Care (Signed)
  Problem: Education: Goal: Knowledge of patient specific risk factors will improve Margaret Torres N/A or DELETE if not current risk factor) Outcome: Progressing   Problem: Ischemic Stroke/TIA Tissue Perfusion: Goal: Complications of ischemic stroke/TIA will be minimized Outcome: Progressing   Problem: Health Behavior/Discharge Planning: Goal: Goals will be collaboratively established with patient/family Outcome: Progressing   Problem: Self-Care: Goal: Verbalization of feelings and concerns over difficulty with self-care will improve Outcome: Progressing   Problem: Education: Goal: Knowledge of secondary prevention will improve (MUST DOCUMENT ALL) Outcome: Progressing

## 2022-09-29 NOTE — Progress Notes (Signed)
  Echocardiogram Echocardiogram Transesophageal has been performed.  Bobbye Charleston 09/29/2022, 3:07 PM

## 2022-09-30 ENCOUNTER — Other Ambulatory Visit (HOSPITAL_COMMUNITY): Payer: Self-pay

## 2022-09-30 ENCOUNTER — Inpatient Hospital Stay (HOSPITAL_COMMUNITY): Payer: Medicare HMO

## 2022-09-30 DIAGNOSIS — I63412 Cerebral infarction due to embolism of left middle cerebral artery: Secondary | ICD-10-CM | POA: Diagnosis not present

## 2022-09-30 MED ORDER — ATORVASTATIN CALCIUM 10 MG PO TABS: 10.0000 mg | ORAL_TABLET | Freq: Every day | ORAL | 1 refills | Status: DC

## 2022-09-30 MED ORDER — CLOPIDOGREL BISULFATE 75 MG PO TABS: 75.0000 mg | ORAL_TABLET | Freq: Every day | ORAL | 0 refills | Status: DC

## 2022-09-30 MED ORDER — CLOPIDOGREL BISULFATE 75 MG PO TABS
75.0000 mg | ORAL_TABLET | Freq: Every day | ORAL | Status: DC
  Administered 2022-09-30: 75 mg via ORAL
  Filled 2022-09-30: qty 1

## 2022-09-30 MED ORDER — ASPIRIN 81 MG PO TBEC
81.0000 mg | DELAYED_RELEASE_TABLET | Freq: Every day | ORAL | Status: DC
  Administered 2022-09-30: 81 mg via ORAL
  Filled 2022-09-30: qty 1

## 2022-09-30 MED ORDER — ATORVASTATIN CALCIUM 10 MG PO TABS: 10.0000 mg | ORAL_TABLET | Freq: Every day | ORAL | Status: DC

## 2022-09-30 MED ORDER — ASPIRIN 81 MG PO TBEC: 81.0000 mg | DELAYED_RELEASE_TABLET | Freq: Every day | ORAL | 12 refills | Status: AC

## 2022-09-30 NOTE — TOC Transition Note (Signed)
Transition of Care [facility]) - CM/SW Discharge Note   Patient Details  Name: Shareese Macha MRN: [id number] Date of Birth: 1946/07/21  Transition of Care 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  PTA, pt independent and living at home alone.  She will have intermittent assistance from her daughter, at bedside.  Discussed recommendation of HHPT/OT with pt/dtr; they are in agreement to [facility] follow up.  Referral to [facility] for continued therapies.  DC home with daughter, Tekisha Darcey [place], [place] [address] Phone: [phone]                      Discharge Plan and Services   Discharge Planning Services: CM Consult                      HH Arranged: PT, OT [facility] Agency: [place] Date [place]: 09/30/22 Time [place] Agency Contacted: 32 Representative spoke with at [place]: Adela Lank  Social Determinants of Health (SDOH) Interventions     Readmission Risk Interventions     No data to display         Reinaldo Raddle, RN, BSN  Trauma/Neuro ICU Case Manager [phone]

## 2022-09-30 NOTE — Plan of Care (Signed)
Reviewed care plan with patient and daughter at bedside.

## 2022-09-30 NOTE — Plan of Care (Signed)
Reviewed with patient and daughter.

## 2022-09-30 NOTE — Discharge Summary (Addendum)
Stroke Discharge Summary  [name]   MRN: [id number]      DOB: 06/25/1946  Date of Admission: 09/28/2022 Date of Discharge: 09/30/2022  Attending Physician:  Antony Contras MD Consultant(s):    cardiology  Patient's PCP:  Midge Minium, MD  DISCHARGE DIAGNOSIS: Stroke:  Left MCA territory branch infarct  Etiology:  M3 occlusion s/p TNK concern for embolic source Principal Problem:   Stroke [facility])   Allergies as of 09/30/2022       Reactions   Pneumococcal Vaccine Shortness Of Breath   Influenza Vaccines    Rash, tongue swelling        Medication List     TAKE these medications    amLODipine 5 MG tablet Commonly known as: NORVASC Take 1 tablet (5 mg total) by mouth daily as needed (SBP>140).   aspirin EC 81 MG tablet Take 1 tablet (81 mg total) by mouth daily. Swallow whole. Start taking on: October 01, 2022   atorvastatin 10 MG tablet Commonly known as: LIPITOR Take 1 tablet (10 mg total) by mouth daily. Start taking on: October 01, 2022   Clenpiq 10-3.5-12 MG-GM -GM/175ML Soln Generic drug: Sod Picosulfate-Mag Ox-Cit Acd Take by mouth.   clonazePAM 0.5 MG tablet Commonly known as: KLONOPIN Take 1 tablet (0.5 mg total) by mouth 2 (two) times daily as needed.   clopidogrel 75 MG tablet Commonly known as: PLAVIX Take 1 tablet (75 mg total) by mouth daily. Start taking on: October 01, 2022   fluticasone 50 MCG/ACT nasal spray Commonly known as: FLONASE Use 2 spray(s) in each nostril once daily   loratadine 10 MG tablet Commonly known as: CLARITIN Take 1 tablet (10 mg total) by mouth daily as needed for allergies. Take 10 mg by mouth daily as needed for allergies.   meclizine 25 MG tablet Commonly known as: ANTIVERT TAKE 1 TABLET BY MOUTH THREE TIMES DAILY AS NEEDED FOR DIZZINESS   omeprazole 20 MG capsule Commonly known as: PRILOSEC Take 20 mg by mouth 2 (two) times daily.   Pataday 0.1 % ophthalmic solution Generic  drug: olopatadine 1 drop 2 (two) times daily.   permethrin 5 % cream Commonly known as: ELIMITE Apply to affected area once   polyethylene glycol powder 17 GM/SCOOP powder Commonly known as: GLYCOLAX/MIRALAX Take 17 g by mouth 2 (two) times daily as needed.   sucralfate 1 g tablet Commonly known as: Carafate Take 1 tablet (1 g total) by mouth 3 (three) times daily with meals.   triamcinolone cream 0.1 % Commonly known as: KENALOG Apply 1 Application topically 2 (two) times daily.   Vitamin D (Ergocalciferol) 1.25 MG (50000 UNIT) Caps capsule Commonly known as: DRISDOL Take 1 capsule (50,000 Units total) by mouth every 7 (seven) days.   Vitamin D (Ergocalciferol) 1.25 MG (50000 UNIT) Caps capsule Commonly known as: DRISDOL Take 1 capsule (50,000 Units total) by mouth every 7 (seven) days.   Vitamin D3 10 MCG (400 UNIT) Caps Take by mouth.        LABORATORY STUDIES CBC    Component Value Date/Time   WBC 8.8 09/28/2022 2152   RBC 3.77 (L) 09/28/2022 2152   HGB 11.5 (L) 09/28/2022 2152   HGB 11.4 06/23/2022 1546   HCT 33.3 (L) 09/28/2022 2152   HCT 34.5 06/23/2022 1546   PLT 253 09/28/2022 2152   PLT 283 06/23/2022 1546   MCV 88.3 09/28/2022 2152   MCV 90 06/23/2022 1546   MCH  30.5 09/28/2022 2152   MCHC 34.5 09/28/2022 2152   RDW 12.7 09/28/2022 2152   RDW 13.3 06/23/2022 1546   LYMPHSABS 1.7 09/28/2022 2152   MONOABS 0.5 09/28/2022 2152   EOSABS 0.0 09/28/2022 2152   BASOSABS 0.0 09/28/2022 2152   CMP    Component Value Date/Time   NA 140 09/28/2022 1928   NA 141 05/26/2022 1014   K 3.4 (L) 09/28/2022 1928   CL 104 09/28/2022 1928   CO2 21 (L) 09/28/2022 1925   GLUCOSE 109 (H) 09/28/2022 1928   BUN 15 09/28/2022 1928   BUN 18 05/26/2022 1014   CREATININE 1.00 09/28/2022 1928   CALCIUM 8.9 09/28/2022 1925   PROT 6.7 09/28/2022 1925   PROT 7.1 05/26/2022 1014   ALBUMIN 3.9 09/28/2022 1925   ALBUMIN 4.6 05/26/2022 1014   AST 33 09/28/2022 1925    ALT 31 09/28/2022 1925   ALKPHOS 55 09/28/2022 1925   BILITOT 0.3 09/28/2022 1925   BILITOT 0.5 05/26/2022 1014   GFRNONAA 55 (L) 09/28/2022 1925   GFRAA >60 03/01/2015 0633   COAGS Lab Results  Component Value Date   INR 1.1 09/28/2022   Lipid Panel    Component Value Date/Time   CHOL 133 09/29/2022 0351   TRIG 40 09/29/2022 0351   HDL 40 (L) 09/29/2022 0351   CHOLHDL 3.3 09/29/2022 0351   VLDL 8 09/29/2022 0351   LDLCALC 85 09/29/2022 0351   HgbA1C  Lab Results  Component Value Date   HGBA1C 5.6 09/29/2022   Urinalysis    Component Value Date/Time   COLORURINE STRAW (A) 09/23/2022 0845   APPEARANCEUR CLEAR 09/23/2022 0845   LABSPEC 1.005 09/23/2022 0845   PHURINE 7.0 09/23/2022 0845   GLUCOSEU NEGATIVE 09/23/2022 0845   HGBUR SMALL (A) 09/23/2022 0845   BILIRUBINUR NEGATIVE 09/23/2022 0845   BILIRUBINUR negative 11/28/2020 1143   KETONESUR NEGATIVE 09/23/2022 0845   PROTEINUR NEGATIVE 09/23/2022 0845   UROBILINOGEN 0.2 06/03/2022 1254   UROBILINOGEN 0.2 07/21/2020 1214   NITRITE NEGATIVE 09/23/2022 0845   LEUKOCYTESUR NEGATIVE 09/23/2022 0845   Urine Drug Screen No results found for: "LABOPIA", "COCAINSCRNUR", "LABBENZ", "AMPHETMU", "THCU", "LABBARB"  Alcohol Level    Component Value Date/Time   ETH <10 09/28/2022 1925     SIGNIFICANT DIAGNOSTIC STUDIES CT HEAD WO CONTRAST  Result Date: 09/30/2022 CLINICAL DATA:  Stroke, follow-up EXAM: CT HEAD WITHOUT CONTRAST TECHNIQUE: Contiguous axial images were obtained from the base of the skull through the vertex without intravenous contrast. RADIATION DOSE REDUCTION: This exam was performed according to the departmental dose-optimization program which includes automated exposure control, adjustment of the mA and/or kV according to patient size and/or use of iterative reconstruction technique. COMPARISON:  09/28/2022 CT head and 09/29/2022 MRI head FINDINGS: Brain: The acute infarcts noted on the 09/29/2022 MRI are  not apparent on this CT. No evidence of acute infarction, hemorrhage, mass, mass effect, or midline shift. No hydrocephalus or extra-axial fluid collection. Periventricular white matter changes, likely the sequela of chronic small vessel ischemic disease. Remote lacunar infarcts in the cerebellar hemispheres. Vascular: No hyperdense vessel. Skull: Normal. Negative for fracture or focal lesion. Sinuses/Orbits: No acute finding. Other: The mastoid air cells are well aerated. IMPRESSION: No acute intracranial process. The acute infarcts noted on the 09/29/2022 MRI are not apparent on this CT. No additional infarcts or evidence of hemorrhagic transformation. Electronically Signed   By: Merilyn Baba M.D.   On: 09/30/2022 00:55   ECHOCARDIOGRAM COMPLETE  Result Date: 09/29/2022  ECHOCARDIOGRAM REPORT   Patient Name:   KEILA TURAN Date of Exam: 09/29/2022 Medical Rec #:  [id number]       Height:       62.0 in Accession #:    [id number]      Weight:       140.4 lb Date of Birth:  October 06, 1946        BSA:          1.645 m Patient Age:    52 years        BP:           111/79 mmHg Patient Gender: F               HR:           58 bpm. Exam Location:  Inpatient Procedure: 2D Echo, Cardiac Doppler and Color Doppler Indications:    Stroke  History:        Patient has no prior history of Echocardiogram examinations.                 Abnormal ECG, Stroke, Arrythmias:RBBB; Risk Factors:Hypertension                 and Dyslipidemia. Cancer.  Sonographer:    Roseanna Rainbow RDCS Referring Phys: [place]  Sonographer Comments: Technically difficult study due to poor echo windows. Patient coughing throughout exam. IMPRESSIONS  1. Left ventricular ejection fraction, by estimation, is 55 to 60%. Left ventricular ejection fraction by 3D volume is 56 %. The left ventricle has normal function. The left ventricle has no regional wall motion abnormalities. There is moderate asymmetric left ventricular hypertrophy of the  basal-septal segment. Left ventricular diastolic parameters are consistent with Grade I diastolic dysfunction (impaired relaxation).  2. Right ventricular systolic function is mildly reduced. The right ventricular size is normal. There is normal pulmonary artery systolic pressure. The estimated right ventricular systolic pressure is 38.2 mmHg.  3. The mitral valve is abnormal. Trivial mitral valve regurgitation.  4. The aortic valve is tricuspid. Aortic valve regurgitation is not visualized.  5. The inferior vena cava is normal in size with greater than 50% respiratory variability, suggesting right atrial pressure of 3 mmHg. Comparison(s): No prior Echocardiogram. FINDINGS  Left Ventricle: Left ventricular ejection fraction, by estimation, is 55 to 60%. Left ventricular ejection fraction by 3D volume is 56 %. The left ventricle has normal function. The left ventricle has no regional wall motion abnormalities. The left ventricular internal cavity size was normal in size. There is moderate asymmetric left ventricular hypertrophy of the basal-septal segment. Left ventricular diastolic parameters are consistent with Grade I diastolic dysfunction (impaired relaxation). Indeterminate filling pressures. Right Ventricle: The right ventricular size is normal. No increase in right ventricular wall thickness. Right ventricular systolic function is mildly reduced. There is normal pulmonary artery systolic pressure. The tricuspid regurgitant velocity is 2.14 m/s, and with an assumed right atrial pressure of 3 mmHg, the estimated right ventricular systolic pressure is 50.5 mmHg. Left Atrium: Left atrial size was normal in size. Right Atrium: Right atrial size was normal in size. Pericardium: There is no evidence of pericardial effusion. Mitral Valve: The mitral valve is abnormal. There is mild calcification of the anterior mitral valve leaflet(s). Trivial mitral valve regurgitation. Tricuspid Valve: The tricuspid valve is grossly  normal. Tricuspid valve regurgitation is trivial. Aortic Valve: The aortic valve is tricuspid. Aortic valve regurgitation is not visualized. Pulmonic Valve: The pulmonic valve was grossly normal. Pulmonic valve regurgitation  is trivial. Aorta: The aortic root and ascending aorta are structurally normal, with no evidence of dilitation. Venous: The inferior vena cava is normal in size with greater than 50% respiratory variability, suggesting right atrial pressure of 3 mmHg. IAS/Shunts: The interatrial septum appears to be lipomatous. No atrial level shunt detected by color flow Doppler.  LEFT VENTRICLE PLAX 2D LVIDd:         4.10 cm         Diastology LVIDs:         2.90 cm         LV e' medial:    5.00 cm/s LV PW:         1.10 cm         LV E/e' medial:  13.5 LV IVS:        1.30 cm         LV e' lateral:   8.92 cm/s LVOT diam:     2.20 cm         LV E/e' lateral: 7.6 LV SV:         76 LV SV Index:   46 LVOT Area:     3.80 cm        3D Volume EF                                LV 3D EF:    Left                                             ventricul LV Volumes (MOD)                            ar LV vol d, MOD    93.8 ml                    ejection A2C:                                        fraction LV vol d, MOD    68.5 ml                    by 3D A4C:                                        volume is LV vol s, MOD    34.8 ml                    56 %. A2C: LV vol s, MOD    37.1 ml A4C:                           3D Volume EF: LV SV MOD A2C:   59.0 ml       3D EF:        56 % LV SV MOD A4C:   68.5 ml       LV EDV:       114 ml LV SV MOD BP:    44.6 ml  LV ESV:       50 ml                                LV SV:        63 ml RIGHT VENTRICLE            IVC RV S prime:     8.59 cm/s  IVC diam: 1.00 cm TAPSE (M-mode): 1.5 cm LEFT ATRIUM           Index        RIGHT ATRIUM           Index LA diam:      3.20 cm 1.95 cm/m   RA Area:     11.40 cm LA Vol (A2C): 15.8 ml 9.61 ml/m   RA Volume:   24.40 ml  14.83 ml/m LA Vol  (A4C): 40.5 ml 24.62 ml/m  AORTIC VALVE             PULMONIC VALVE LVOT Vmax:   96.90 cm/s  PR End Diast Vel: 1.36 msec LVOT Vmean:  56.600 cm/s LVOT VTI:    0.200 m  AORTA Ao Root diam: 3.10 cm Ao Asc diam:  3.50 cm MITRAL VALVE                TRICUSPID VALVE MV Area (PHT): 3.09 cm     TR Peak grad:   18.3 mmHg MV Decel Time: 245 msec     TR Vmax:        214.00 cm/s MV E velocity: 67.60 cm/s MV A velocity: 106.33 cm/s  SHUNTS MV E/A ratio:  0.64         Systemic VTI:  0.20 m                             Systemic Diam: 2.20 cm Lyman Bishop MD Electronically signed by Lyman Bishop MD Signature Date/Time: 09/29/2022/7:08:04 PM    Final    MR BRAIN WO CONTRAST  Result Date: 09/29/2022 CLINICAL DATA:  Stroke follow-up.  Received TNK. EXAM: MRI HEAD WITHOUT CONTRAST TECHNIQUE: Multiplanar, multiecho pulse sequences of the brain and surrounding structures were obtained without intravenous contrast. COMPARISON:  CT head 09/28/2022 FINDINGS: Brain: Small areas of acute infarct in the left posterior insula extending into the left parietal cortex and left occipital parietal cortex. Negative for intracranial hemorrhage Chronic small infarcts in the cerebellum bilaterally. Moderate white matter hyperintensities on FLAIR compatible with chronic microvascular ischemia. Negative for mass lesion. Vascular: Normal arterial flow voids. Skull and upper cervical spine: Negative Sinuses/Orbits: Paranasal sinuses clear. Mastoid clear. Negative orbit Other: None IMPRESSION: 1. Small areas of acute infarct in the left posterior insula, left parietal, and left occipital parietal cortex. 2. Moderate chronic microvascular ischemic change in the white matter. Chronic small infarcts in the cerebellum bilaterally. 3. Negative for intracranial hemorrhage. Electronically Signed   By: Franchot Gallo M.D.   On: 09/29/2022 18:57   CT HEAD CODE STROKE WO CONTRAST  Result Date: 09/28/2022 CLINICAL DATA:  Stroke suspected. EXAM: CT HEAD  WITHOUT CONTRAST TECHNIQUE: Contiguous axial images were obtained from the base of the skull through the vertex without intravenous contrast. RADIATION DOSE REDUCTION: This exam was performed according to the departmental dose-optimization program which includes automated exposure control, adjustment of the mA and/or kV according to patient size and/or use of iterative reconstruction technique. COMPARISON:  CT head 11/08/2005 FINDINGS: Brain: There is a small area of hypodensity with loss of gray-white differentiation in the left frontal lobe suspicious for small area of infarct given findings on CTA/CTP. There is no hemorrhage or mass effect. Aspects is 9. Parenchymal volume is within expected limits for age. The ventricles are normal in size. Gray-white differentiation is preserved. There are small remote lacunar infarcts in the bilateral cerebellar hemispheres. Additional patchy hypodensity in the supratentorial white matter likely reflects sequela of chronic small-vessel ischemic change There is no mass lesion there is no mass effect or midline shift. Vascular: See below. Skull: Normal. Negative for fracture or focal lesion. Sinuses/Orbits: The paranasal sinuses are clear. The globes and orbits are unremarkable. Other: None. Review of the MIP images confirms the above findings IMPRESSION: Small area of hypodensity in the left frontal lobe with loss of gray-white differentiation likely reflecting a small area of infarct given findings on CTA/CTP. Aspects is 9. Findings communicated to Dr. Leonel Ramsay at 7:55 p.m. Electronically Signed   By: Valetta Mole M.D.   On: 09/28/2022 20:01   CT ANGIO HEAD NECK W WO CM W PERF (CODE STROKE)  Result Date: 09/28/2022 CLINICAL DATA:  Acute neuro deficit.  Stroke suspected. EXAM: CT ANGIOGRAPHY HEAD AND NECK CT PERFUSION BRAIN TECHNIQUE: Multidetector CT imaging of the head and neck was performed using the standard protocol during bolus administration of intravenous  contrast. Multiplanar CT image reconstructions and MIPs were obtained to evaluate the vascular anatomy. Carotid stenosis measurements (when applicable) are obtained utilizing NASCET criteria, using the distal internal carotid diameter as the denominator. Multiphase CT imaging of the brain was performed following IV bolus contrast injection. Subsequent parametric perfusion maps were calculated using RAPID software. RADIATION DOSE REDUCTION: This exam was performed according to the departmental dose-optimization program which includes automated exposure control, adjustment of the mA and/or kV according to patient size and/or use of iterative reconstruction technique. CONTRAST:  122m OMNIPAQUE IOHEXOL 350 MG/ML SOLN COMPARISON:  CT head 09/28/2022 FINDINGS: CTA NECK FINDINGS Aortic arch: Bovine branching arch. Mild atherosclerotic calcification aortic arch without aneurysm. Proximal great vessels patent without stenosis. Right carotid system: Right carotid system widely patent without stenosis or significant atherosclerotic disease. Left carotid system: Left carotid system widely patent without stenosis or significant atherosclerotic disease. Vertebral arteries: Both vertebral arteries patent to the skull base without stenosis. Left vertebral dominant. Skeleton: Cervical spondylosis. Multilevel spinal and foraminal stenosis due to spurring. Other neck: Negative for soft tissue mass or adenopathy in the neck. Upper chest: Lung apices clear bilaterally Review of the MIP images confirms the above findings CTA HEAD FINDINGS Anterior circulation: Internal carotid artery widely patent through the skull base and cavernous segment. Anterior cerebral arteries widely patent bilaterally. Right middle cerebral artery widely patent Left M1 segment patent. There is occlusion of a left M3 branch involving the inferior division. This a relatively large branch. This appears acute. Posterior circulation: Both vertebral arteries patent  to the basilar without stenosis. PICA patent bilaterally. Basilar widely patent. Superior cerebellar and posterior cerebral arteries patent bilaterally without stenosis. Venous sinuses: Normal venous enhancement CT Brain Perfusion Findings: ASPECTS: 10 CBF (<30%) Volume: 585mPerfusion (Tmax>6.0s) volume: 1852mismatch Volume: 31m41mfarction Location:Left parietal lobe IMPRESSION: 1. Acute occlusion of a left M3 branch involving the inferior division. CT perfusion demonstrates a 5 mL core infarct in the left parietal lobe with 13 mL of surrounding penumbra. 2. No significant carotid or vertebral artery stenosis in the neck. 3. Aortic atherosclerosis. 4.  Code stroke imaging results were communicated on 09/28/2022 at 7:52 pm to provider Leonel Ramsay via telephone Aortic Atherosclerosis (ICD10-I70.0). Electronically Signed   By: Franchot Gallo M.D.   On: 09/28/2022 19:57      HISTORY OF PRESENT ILLNESS Ms. Andreya Lacks is a 76 y.o. female with history of HTN, HLD presenting with aphasia.  She received TNK in the ED.  CTA head and neck showed an occlusion of her left M3 branch involving the inferior division.  She is not a candidate for mechanical thrombectomy due to distal occlusion.  Repeat imaging later today and then plan for DAPT therapy with aspirin and Plavix for 3 weeks and then aspirin 81 mg alone.  Ms. Schiller tells [name] that she takes aspirin 3 times per week and we explained to the importance of her taking this medication every day now that she has had this event.  She is in agreement    [facility] Stroke CT head Small area of hypodensity in the left frontal lobe with loss of gray-white differentiation likely reflecting a small area of infarct. Aspects 9 CTA head & neck  Acute occlusion of a left M3 branch involving the inferior division.  CT perfusion CT perfusion demonstrates a 5 mL core infarct in the left parietal lobe with 13 mL of surrounding penumbra. MRI  Small areas of acute  infarct in the left posterior insula, left parietal, and left occipital parietal cortex. Moderate chronic microvascular ischemic change in the white matter. Chronic small infarcts in the cerebellum bilaterally. Negative for intracranial hemorrhage. 2D Echo LVEF 55 to 60% LDL 85 HgbA1c 5.6 VTE prophylaxis - SCDs       Diet    Diet Heart Room service appropriate? Yes with Assist; Fluid consistency: Thin        Aspirin '81mg'$  3 times per week prior to admission, now on aspirin 81 mg daily and clopidogrel 75 mg daily for 3 weeks and then Aspirin '81mg'$  alone for life once 24 hours post TNK Therapy recommendations: Home health PT/OT Disposition: Home after loop recorder placement  Patient refused loop recorder and prolonged cardiac monitoring Hypertension Home meds: Amlodipine as needed Stable BP goal less than 180   Hyperlipidemia Home meds: None LDL 85, goal < 70 Atorvastatin '10mg'$  added (patient is reluctant to take full dose statin) Continue statin at discharge   Other Stroke Risk Factors Advanced Age >/= 11    Other Active Problems Anxiety-she was recently seen by our psychiatry team in the ED on 12/15.  This note was reviewed. Clonazepam resumed Add Atarax for itching GERD Resume omeprazole and Carafate   DISCHARGE EXAM Blood pressure (!) 153/92, pulse (!) 59, temperature 98.7 F (37.1 C), temperature source Oral, resp. rate 14, height '5\' 2"'$  (1.575 m), weight 63.7 kg, last menstrual period 06/30/2013, SpO2 100 %.  PHYSICAL EXAM Constitutional: Appears frail elderly African-American lady cardiovascular: Normal rate and regular rhythm.  Respiratory: Effort normal and breath sounds normal to anterior ascultation   Neuro: Mental Status: Patient is awake, alert, expressive and receptive aphasia is improved.  Still has some word finding difficulties and paraphasic errors. Accurately identifies objects.  Cranial Nerves: II: Visual Fields are full. Pupils are equal, round, and  reactive to light.   III,IV, VI: EOMI without ptosis or diploplia.  V: Facial sensation is symmetric to temperature VII: Facial movement is symmetric.  VIII: hearing is intact to voice X: Uvula elevates symmetrically XI: Shoulder shrug is symmetric. XII: tongue is midline without atrophy or  fasciculations.  Motor: Tone is normal. Bulk is normal. 5/5 strength was present in all four extremities.  Sensory: Sensation is symmetric to light touch and temperature in the arms and legs. Cerebellar: No clear ataxia on finger-nose-finger, but slowed on the right.   Discharge Diet       Diet   Diet Heart Room service appropriate? Yes with Assist; Fluid consistency: Thin   liquids  DISCHARGE PLAN Disposition:  Home after loop recorder placement aspirin 81 mg daily and clopidogrel 75 mg daily for secondary stroke prevention for 3 weeks and then Aspirin '81mg'$  daily alone. Ongoing stroke risk factor control by Primary Care Physician at time of discharge Follow-up PCP Midge Minium, MD in 2 weeks. Patient refused prolonged cardiac monitoring with loop recorder for 30-day heart monitor Follow-up in [facility] in 8 weeks with stroke nurse practitioner, office to schedule an appointment.   35 minutes were spent preparing discharge.  Patient seen and examined by NP/APP with MD. MD to update note as needed.   Janine Ores, DNP, FNP-BC [facility] Pager: [phone]  I have personally obtained history,examined this patient, reviewed notes, independently viewed imaging studies, participated in medical decision making and plan of care.ROS completed by me personally and pertinent positives fully documented  I have made any additions or clarifications directly to the above note. Agree with note above.    Antony Contras, MD Medical Director [facility] Pager: [phone] 09/30/2022 5:21 PM

## 2022-10-01 ENCOUNTER — Telehealth: Payer: Self-pay

## 2022-10-01 ENCOUNTER — Telehealth: Payer: Self-pay | Admitting: *Deleted

## 2022-10-01 NOTE — Progress Notes (Signed)
  Care Coordination  Note  10/01/2022 Name: Aara Jacquot MRN: [id number] DOB: 1945-10-27  Hula Tasso is a 76 y.o. year old primary care patient of Tabori, Aundra Millet, MD. I reached out to [name] by phone today to assist with scheduling a follow up appointment. Mailin Coglianese verbally consented to my assistance.       Follow up plan: Hospital Follow Up appointment scheduled with (Dr Birdie Riddle) on (10/13/2022) at (920am).  Julian Hy, [place] Direct Dial: [phone]

## 2022-10-01 NOTE — Patient Outreach (Signed)
  Care Coordination TOC Note Transition Care Management Follow-up Telephone Call Date of discharge and from where: 09/30/22-[place]  Dx: "stroke" How have you been since you were released from the hospital? Patient voices she is doing okay. Her daughter is with her and helping her out. She states she rested well last night and ate a bowl of oatmeal for breakfast. She voices that her stomach "feels a little upset." She is drinking some ginger ale. Offered other non-pharmacological measures to help with relief.  Any questions or concerns? No  Items Reviewed: Did the pt receive and understand the discharge instructions provided? Yes  Medications obtained and verified? No -Patient reported meds were ina  different room and she did not wish to go get them-states daughter is picking up her new meds today Other? Yes  Any new allergies since your discharge? No  Dietary orders reviewed? Yes Do you have support at home? Yes   Home Care and Equipment/Supplies: Were home health services ordered? yes If so, what is the name of the agency? [facility]  Has the agency set up a time to come to the patient's home? no Were any new equipment or medical supplies ordered?  No What is the name of the medical supply agency? N/A Were you able to get the supplies/equipment? not applicable Do you have any questions related to the use of the equipment or supplies? No  Functional Questionnaire: (I = Independent and D = Dependent) ADLs: A  Bathing/Dressing- A  Meal Prep- A  Eating- I  Maintaining continence- I  Transferring/Ambulation- A  Managing Meds- A  Follow up appointments reviewed:  PCP Hospital f/u appt confirmed? No  Patient agreeable to care guide assisting with scheduling. [facility] f/u appt confirmed? No  patient will have daughter call and make an appt. Are transportation arrangements needed? No  If their condition worsens, is the pt aware to call PCP or go to the Emergency  Dept.? Yes Was the patient provided with contact information for the PCP's office or ED? Yes Was to pt encouraged to call back with questions or concerns? Yes  SDOH assessments and interventions completed:   Yes SDOH Interventions Today    Flowsheet Row Most Recent Value  SDOH Interventions   Food Insecurity Interventions Intervention Not Indicated  Transportation Interventions Intervention Not Indicated       Care Coordination Interventions:  PCP follow up appointment requested Education provided    Encounter Outcome:  Pt. Visit Completed     Enzo Montgomery, RN,BSN,CCM [facility] Telephonic Care Management Coordinator Direct Phone: [phone] Toll Free: [phone] Fax: [phone]

## 2022-10-06 ENCOUNTER — Telehealth: Payer: Self-pay | Admitting: Family Medicine

## 2022-10-06 NOTE — Telephone Encounter (Signed)
Verbal order provided.

## 2022-10-06 NOTE — Telephone Encounter (Signed)
Caller name: Eliezer Lofts from [place]?: Yes  Call back number: [phone]  Provider they see: Midge Minium, MD  Reason for call:  Verbal order: PT twice a week for three weeks and once a week for three weeks. Patient will be working on her strengths.  Patient will have gait training,  balances training  and stairs training.

## 2022-10-07 NOTE — Telephone Encounter (Signed)
Just an FYI

## 2022-10-07 NOTE — Telephone Encounter (Signed)
Jenna call back, stating that Ms.Rehfeldt has declined [facility]. Ms. Reach told [place] services she doesn't feel comfortable with strangers in her home. Eliezer Lofts states that [facility] services will not continue services with Ms.Bonham  at this time.

## 2022-10-08 ENCOUNTER — Telehealth: Payer: Self-pay | Admitting: Family Medicine

## 2022-10-08 NOTE — Telephone Encounter (Signed)
Noted, paperwork signed and placed in scan folder.

## 2022-10-08 NOTE — Telephone Encounter (Signed)
Noted. Signed paperwork from [facility]. Will leave in inbox as FYI to Dr. Birdie Riddle.

## 2022-10-08 NOTE — Telephone Encounter (Signed)
Placed in your to be signed folder at the station.

## 2022-10-08 NOTE — Telephone Encounter (Signed)
Received form from [facility]. Placed in front bin.

## 2022-10-12 ENCOUNTER — Telehealth: Payer: Self-pay | Admitting: Family Medicine

## 2022-10-12 NOTE — Telephone Encounter (Signed)
Please let her know I wish her all the best and that's wonderful for her to have found someone closer

## 2022-10-12 NOTE — Telephone Encounter (Signed)
FYI

## 2022-10-12 NOTE — Telephone Encounter (Signed)
(  FYI) Patent to called stating that she has found a new pcp closer to her  home. Patient stated that she will not make her HFU appt tomorrow. Patient has given me the right to cancel ALL future appt with Dr.Tabori.

## 2022-10-13 ENCOUNTER — Telehealth: Payer: Self-pay | Admitting: Family Medicine

## 2022-10-13 ENCOUNTER — Inpatient Hospital Stay: Payer: Medicare HMO | Admitting: Family Medicine

## 2022-10-13 DIAGNOSIS — I10 Essential (primary) hypertension: Secondary | ICD-10-CM | POA: Diagnosis not present

## 2022-10-13 DIAGNOSIS — K219 Gastro-esophageal reflux disease without esophagitis: Secondary | ICD-10-CM | POA: Diagnosis not present

## 2022-10-13 DIAGNOSIS — L299 Pruritus, unspecified: Secondary | ICD-10-CM | POA: Diagnosis not present

## 2022-10-13 DIAGNOSIS — L729 Follicular cyst of the skin and subcutaneous tissue, unspecified: Secondary | ICD-10-CM | POA: Diagnosis not present

## 2022-10-13 DIAGNOSIS — I679 Cerebrovascular disease, unspecified: Secondary | ICD-10-CM | POA: Diagnosis not present

## 2022-10-13 NOTE — Telephone Encounter (Signed)
Pt states she is no longer a patient here.  If this is from dates of service covering last month, I will sign.  But if these are new orders, they will need to be handled by her new PCP

## 2022-10-13 NOTE — Telephone Encounter (Signed)
Looking at the forms some O/E dates are 10/11/2021.

## 2022-10-13 NOTE — Telephone Encounter (Signed)
Called patient to give her Dr.Tabori message. Patient was very thankful with our services.

## 2022-10-13 NOTE — Telephone Encounter (Signed)
Forms placed in Dr Tabori to be signed folder  

## 2022-10-13 NOTE — Telephone Encounter (Signed)
Received home health forms from [facility]. Placed in front bin.

## 2022-10-18 NOTE — Telephone Encounter (Signed)
Placed charged sheet and faxed forms . Place in scan

## 2022-10-18 NOTE — Telephone Encounter (Signed)
Forms completed and returned to [facility].  Forms need charge sheet

## 2022-10-22 ENCOUNTER — Encounter: Payer: Self-pay | Admitting: Physician Assistant

## 2022-10-22 ENCOUNTER — Ambulatory Visit: Payer: Medicare HMO | Admitting: Physician Assistant

## 2022-10-22 VITALS — BP 137/83 | HR 85 | Ht 64.5 in | Wt 140.8 lb

## 2022-10-22 DIAGNOSIS — I63412 Cerebral infarction due to embolism of left middle cerebral artery: Secondary | ICD-10-CM

## 2022-10-22 NOTE — Patient Instructions (Signed)
Follow up in 6 months Recommend Holter monitor for 30 days with cardiologist Continue aspirin and lipitor and monitor blood pressure.

## 2022-10-22 NOTE — Progress Notes (Addendum)
[facility] Clinic Note - Initial Visit   Date: 10/22/22  Nautica Hotz MRN: [id number] DOB: 20-Nov-1945   Dear Dr Birdie Riddle, Aundra Millet, MD:  Thank you for your kind referral of Margaret Torres for consultation of recent stroke. Although her history is well known to you, please allow Margaret Torres to reiterate it for the purpose of our medical record. The patient was here alone     History of Present Illness:  Margaret Torres is a 77 y.o. R-handed female with a history of HTN,  RBBB, GERD, depression, recently seen at the ED for recent stroke. Per patient report, on 09/28/22 while on the phone, her daughter told her that she "was not making any sense, speech was slurred" without any other neurological complaints. "The speech returned in the afternoon the next day"-she said. Patient  never had a similar episode. Denies any prior history of TIA. Denies frank vertigo, she has some dizziness when BP changes and takes meclizine with some relief.  Denies any chest pain, or shortness of breath.  "Prior to that stroke I had skin mites treating it with a lotion and a pill" . Denies any fever, chills, or night sweats. No tobacco. No new meds or hormonal supplements. She was not on ASA prior to the stroke. Denies any recent long distance trips or recent surgeries.  No recent Covid. She reports unintentional 25 lb weight loss since the beginning of the year "with all my labs ok".  Patient is compliant with her medications. Patient is very active especially in the summer, including fishing, works around the house, cutting the grass etc. "  great-grandmothers from both sides, grandmothers and oldest sister had a history of stroke.    Outside paper records, electronic medical record, and images have been reviewed where available and summarized as:   CT head reported an area of hypodensity in the L frontal lobe, likely small infarction. She received TNK in the ED. CT angio head and neck  remarkable for L M3 branch occlusion at the inferior division. She was not a candidate for mechanical thrombectomy due to distal occlusion.   CT perfusion showed a 5 ml core infarct in the L parietal lobe with 13 ml penumbra.  MRI brain showed small areas of acute infarct in the L posterior insula, L parietal and L occipital parietal cortex, with moderate chronic microvascular ischemic changes in the white matter and chronic small infarcts in the B cerebellum.  No hemorrhage. She was started on DAPT with ASA and Plavix for 3 weeks, then ASA alone.  2 D echo EF 55-60 %, with moderate LVH (Gr I DD) LDL 85 and A21C 5.6.      Past Medical History:  Diagnosis Date   Allergic rhinitis 07/16/2007   Carbon monoxide exposure    Dizziness    Dysphagia 03/02/2016   Essential hypertension 07/16/2007   First degree hemorrhoids 11/28/2020   Generalized abdominal pain 11/28/2020   GERD (gastroesophageal reflux disease) 10/12/2018   Hemorrhage of rectum and anus 11/28/2020   History of cervical cancer    HTN (hypertension)    Hyperlipidemia 06/26/2007   Hyperthyroidism 04/21/2016   hx of thyroid nodule   Insomnia 06/20/2014   Left anterior knee pain 09/30/2014   Leg pain, bilateral 03/02/2016   Major depressive disorder 07/16/2007   Mild cognitive impairment with memory loss 05/14/2022   Multiple thyroid nodules 12/30/2017   Olecranon bursitis of left elbow 01/30/2016   Primary osteoarthritis of both knees 10/20/2018  RBBB    Second degree hemorrhoids 11/28/2020   Vitamin D deficiency 03/19/2022   Wheezing 11/17/2020    Past Surgical History:  Procedure Laterality Date   ABDOMINAL HYSTERECTOMY     WISDOM TOOTH EXTRACTION Bilateral      Medications:  Outpatient Encounter Medications as of 10/22/2022  Medication Sig Note   amLODipine (NORVASC) 5 MG tablet Take 1 tablet (5 mg total) by mouth daily as needed (SBP>140).    aspirin EC 81 MG tablet Take 1 tablet (81 mg total) by mouth daily.  Swallow whole.    atorvastatin (LIPITOR) 10 MG tablet Take 1 tablet (10 mg total) by mouth daily.    Cholecalciferol (VITAMIN D3) 10 MCG (400 UNIT) CAPS Take by mouth.    CLENPIQ 10-3.5-12 MG-GM -GM/175ML SOLN Take by mouth. 06/23/2022: Patient has not started taking .   clonazePAM (KLONOPIN) 0.5 MG tablet Take 1 tablet (0.5 mg total) by mouth 2 (two) times daily as needed.    clopidogrel (PLAVIX) 75 MG tablet Take 1 tablet (75 mg total) by mouth daily.    fluticasone (FLONASE) 50 MCG/ACT nasal spray Use 2 spray(s) in each nostril once daily    loratadine (CLARITIN) 10 MG tablet Take 1 tablet (10 mg total) by mouth daily as needed for allergies. Take 10 mg by mouth daily as needed for allergies.    meclizine (ANTIVERT) 25 MG tablet TAKE 1 TABLET BY MOUTH THREE TIMES DAILY AS NEEDED FOR DIZZINESS    olopatadine (PATADAY) 0.1 % ophthalmic solution 1 drop 2 (two) times daily.    omeprazole (PRILOSEC) 20 MG capsule Take 20 mg by mouth 2 (two) times daily.    permethrin (ELIMITE) 5 % cream Apply to affected area once    polyethylene glycol powder (GLYCOLAX/MIRALAX) 17 GM/SCOOP powder Take 17 g by mouth 2 (two) times daily as needed.    sucralfate (CARAFATE) 1 g tablet Take 1 tablet (1 g total) by mouth 3 (three) times daily with meals.    triamcinolone cream (KENALOG) 0.1 % Apply 1 Application topically 2 (two) times daily.    Vitamin D, Ergocalciferol, (DRISDOL) 1.25 MG (50000 UNIT) CAPS capsule Take 1 capsule (50,000 Units total) by mouth every 7 (seven) days.    Vitamin D, Ergocalciferol, (DRISDOL) 1.25 MG (50000 UNIT) CAPS capsule Take 1 capsule (50,000 Units total) by mouth every 7 (seven) days.    No facility-administered encounter medications on file as of 10/22/2022.    Allergies:  Allergies  Allergen Reactions   Pneumococcal Vaccine Shortness Of Breath   Influenza Vaccines     Rash, tongue swelling    Family History: Family History  Problem Relation Age of Onset   Cervical cancer  Mother    Hypertension Mother    Hypertension Father    Diabetes Sister    Stroke Sister    Cancer Sister        bone cancer   Breast cancer Sister    Heart disease Brother    Diabetes Brother    Stroke Maternal Grandmother    Heart attack Son    Cancer Other    Autism spectrum disorder Other    Thyroid disease Neg Hx     Social History: Social History   Tobacco Use   Smoking status: Never   Smokeless tobacco: Never  Substance Use Topics   Alcohol use: No   Drug use: No   Social History   Social History Narrative   Hobbies: enjoys fishing    1 Daughter that  lives locally    1 Son lives in [place]    3 [place]    1 Son deceased     Vital Signs:  LMP 06/30/2013    General Medical Exam:   General:  Well appearing, comfortable.   Eyes/ENT: see cranial nerve examination.   Neck:   No carotid bruits. Respiratory:  Clear to auscultation, good air entry bilaterally.   Cardiac:  Regular rate and rhythm, no murmur.   Extremities:  No deformities, edema, or skin discoloration.  Skin:  No rashes or lesions.  Neurological Exam:  MENTAL STATUS including orientation to time, place, person, recent and remote memory, attention span and concentration, language, and fund of knowledge is normal.  Speech is not dysarthric.  CRANIAL NERVES: II:  No visual field defects.  Unremarkable fundi.   III-IV-VI: Pupils equal round and reactive to light.  Normal conjugate, extra-ocular eye movements in all directions of gaze.  No nystagmus.  No ptosis .   V:  Normal facial sensation.    VII:  Normal facial symmetry and movements.   VIII:  Normal hearing and vestibular function.   IX-X:  Normal palatal movement.   XI:  Normal shoulder shrug and head rotation.   XII:  Normal tongue strength and range of motion, no deviation or fasciculation.  MOTOR:  No atrophy, fasciculations or abnormal movements.  No pronator drift.    SENSORY:  Normal and symmetric perception of light touch,  pinprick, vibration, and proprioception.  Romberg's sign absent.   COORDINATION/GAIT: Normal finger-to- nose-finger and heel-to-shin.  Intact rapid alternating movements bilaterally.  Able to rise from a chair without using arms.  Gait narrow based and stable. Tandem and stressed gait intact.    IMPRESSION/PLAN:  L MCA territory branch infarct, etiology M3 occlusion s/p TNK concern for embolic source   MRI brain showed small areas of acute infarct in the L posterior insula, L parietal and L occipital parietal cortex, with moderate chronic microvascular ischemic changes in the white matter and chronic small infarcts in the B cerebellum.  CT angio head and neck remarkable for L M3 branch occlusion at the inferior division. She was not a candidate for mechanical thrombectomy due to distal occlusion.   CT perfusion showed a 5 ml core infarct in the L parietal lobe with 13 ml penumbra. No hemorrhage. She was started on DAPT with ASA and Plavix for 3 weeks, then ASA alone. 2 D echo EF 55-60 %, with moderate LVH (Gr I DD) LDL 85 and A21C 5.6.   Recommendations  Continue baby aspirin daily Agree with loop recorder for embolic etiology of stroke. Sent a message to Dr. Audie Box to arrange this as OP during her Cardiology follow up.  Monitor cardiovascular risk factors, specifically high blood pressure, with a BP goal less than 180. Return to clinic in 6 months.  Total time spent:     Thank you for allowing me to participate in patient's care.  If I can answer any additional questions, I would be pleased to do so.    Sincerely,   Sharene Butters, PA-C

## 2022-10-25 ENCOUNTER — Telehealth: Payer: Self-pay

## 2022-10-25 ENCOUNTER — Other Ambulatory Visit: Payer: Self-pay

## 2022-10-25 DIAGNOSIS — I639 Cerebral infarction, unspecified: Secondary | ICD-10-CM

## 2022-10-25 NOTE — Telephone Encounter (Signed)
-----  Message from Geralynn Rile, MD sent at 10/23/2022  5:41 PM EST ----- Regarding: RE: Holter Sara:  Not a problem.   Almyra Free -> Can you arrange 30 day monitor?  [place] T. Audie Box, MD, [place]  [address] [place], [place] [address] [phone]  5:41 PM  ----- Message ----- From: Elease Hashimoto Sent: 10/22/2022   2:23 PM EST To: Geralynn Rile, MD Subject: Holter                                         Good afternoon Dr. Gordy Councilman, I had the pleasure to see the patient after her hospitalization for stroke.  Wanted to recommend on this nice patient a 30-day Holter monitor to complete the workup, to further evaluate embolic etiology of the stroke.  Patient is currently on baby aspirin daily after having been on aspirin and Plavix for 3 weeks.  She is doing well, there is no neurological residual deficiencies.  I hope you have a good day thanks for your time. Best Regards  Sharene Butters, PA-C

## 2022-10-25 NOTE — Telephone Encounter (Signed)
30 day event monitor ordered.   Thanks!

## 2022-10-26 ENCOUNTER — Other Ambulatory Visit: Payer: Self-pay | Admitting: Cardiovascular Disease

## 2022-10-26 DIAGNOSIS — I4510 Unspecified right bundle-branch block: Secondary | ICD-10-CM

## 2022-10-26 DIAGNOSIS — I639 Cerebral infarction, unspecified: Secondary | ICD-10-CM

## 2022-10-26 DIAGNOSIS — R42 Dizziness and giddiness: Secondary | ICD-10-CM

## 2022-10-27 DIAGNOSIS — L853 Xerosis cutis: Secondary | ICD-10-CM | POA: Diagnosis not present

## 2022-11-01 IMAGING — DX DG CHEST 2V
2 series · 2 of 2 positions shown · non-contrast
Comparison: Radiographs 04/22/2020.

CLINICAL DATA: Wheezing.

EXAM:
CHEST - 2 VIEW

[chest pa]
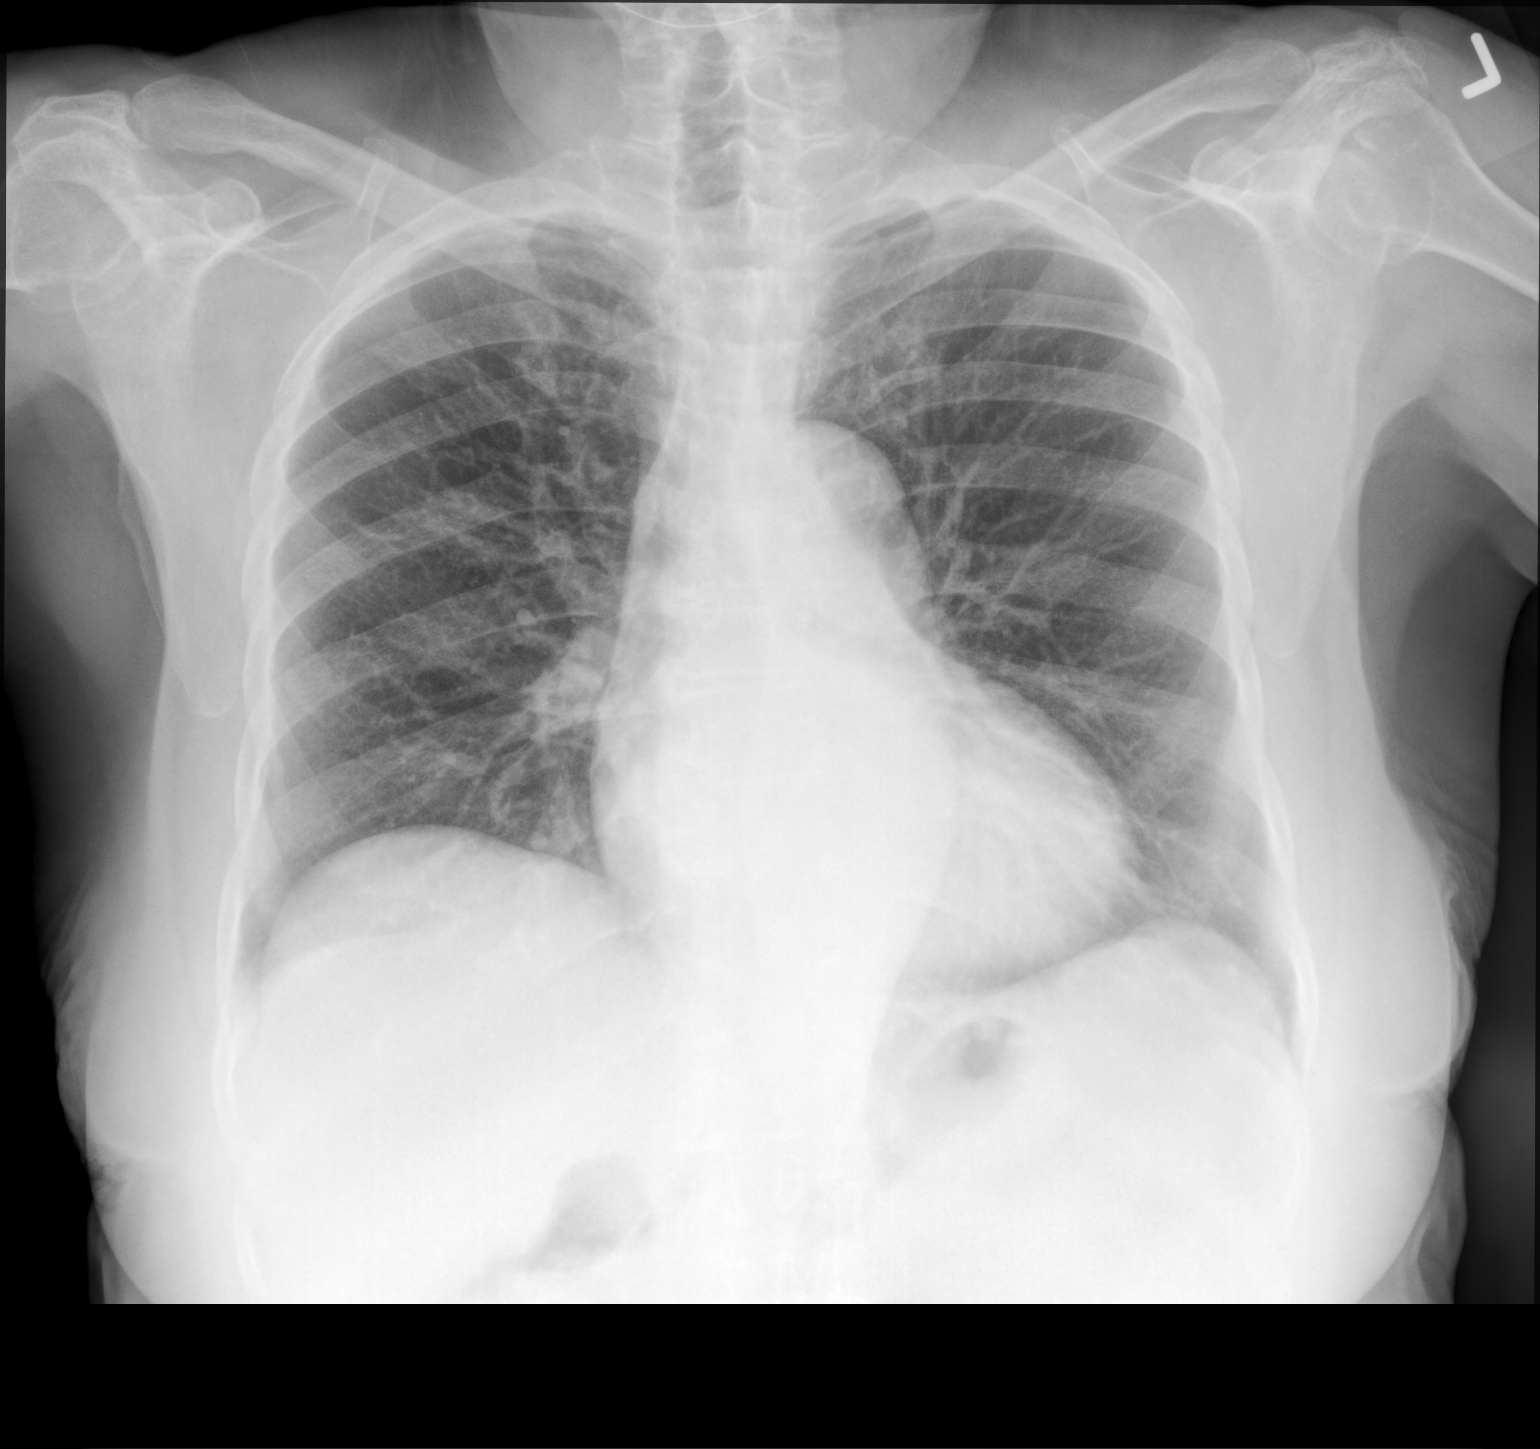

[chest lat]
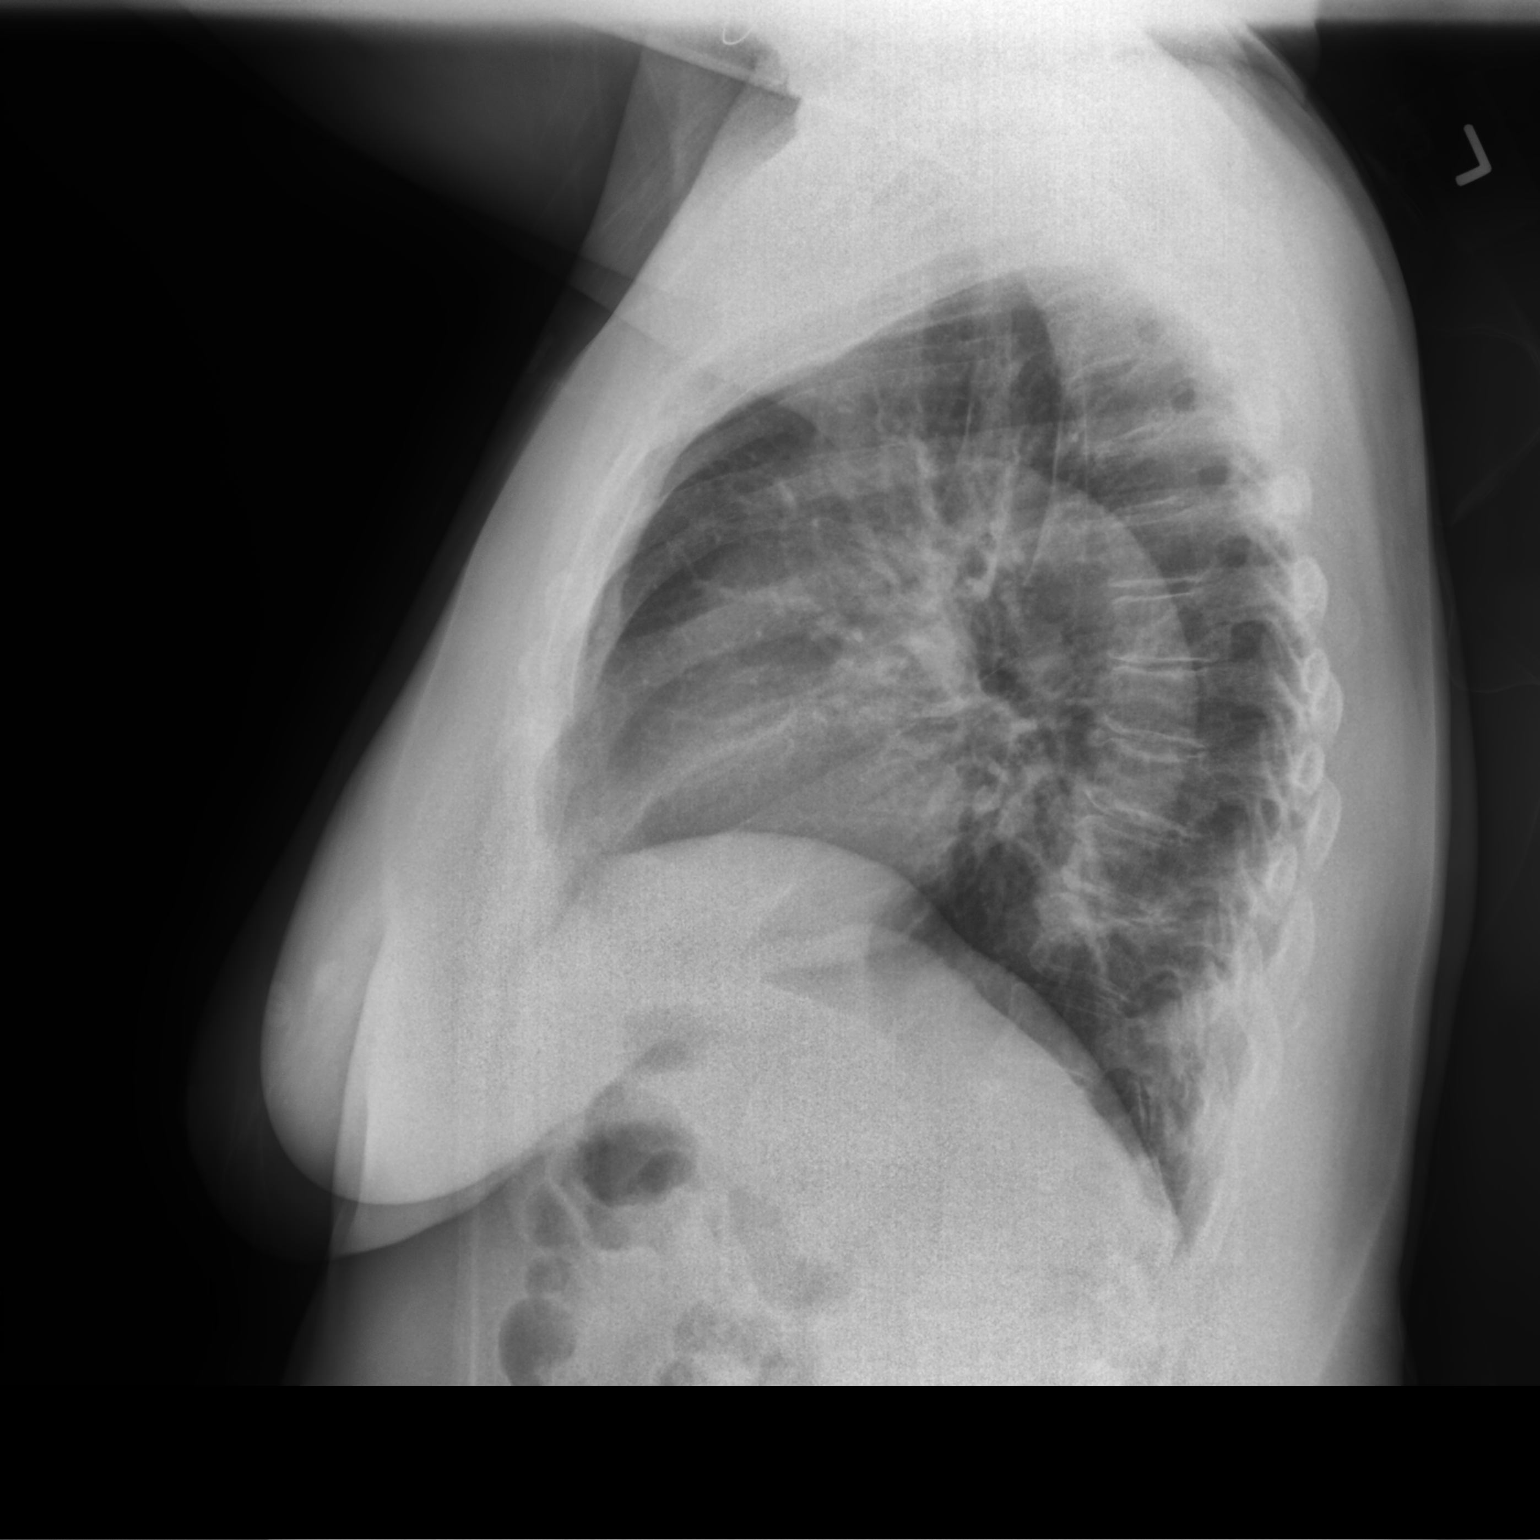

[2 of 2 positions shown; findings below may reference images not displayed]

FINDINGS: There is a lesser degree of inspiration on both views. The heart
size and mediastinal contours are stable. There is mild central
airway thickening without edema, confluent airspace opacity, pleural
effusion or pneumothorax. No acute osseous findings are evident.
There are mild degenerative changes within the thoracic spine.
IMPRESSION: Mild central airway thickening suggesting bronchitis. No evidence of
pneumonia.

## 2022-11-05 ENCOUNTER — Ambulatory Visit
Admission: RE | Admit: 2022-11-05 | Discharge: 2022-11-05 | Disposition: A | Discharge status: Home / Self Care | Payer: Medicare HMO | Source: Ambulatory Visit | Attending: Family Medicine | Admitting: Family Medicine

## 2022-11-05 DIAGNOSIS — Z1231 Encounter for screening mammogram for malignant neoplasm of breast: Secondary | ICD-10-CM | POA: Diagnosis not present

## 2022-11-08 ENCOUNTER — Other Ambulatory Visit (HOSPITAL_COMMUNITY): Payer: Self-pay | Admitting: Gastroenterology

## 2022-11-08 DIAGNOSIS — K2970 Gastritis, unspecified, without bleeding: Secondary | ICD-10-CM | POA: Diagnosis not present

## 2022-11-08 DIAGNOSIS — A048 Other specified bacterial intestinal infections: Secondary | ICD-10-CM | POA: Diagnosis not present

## 2022-11-08 DIAGNOSIS — D573 Sickle-cell trait: Secondary | ICD-10-CM | POA: Diagnosis not present

## 2022-11-08 DIAGNOSIS — R634 Abnormal weight loss: Secondary | ICD-10-CM

## 2022-11-17 ENCOUNTER — Ambulatory Visit (HOSPITAL_COMMUNITY): Payer: Medicare HMO

## 2022-11-30 NOTE — Progress Notes (Deleted)
Cardiology Office Note:   Date:  11/30/2022  NAME:  Margaret Torres    MRN: [id number] DOB:  12/05/1945   PCP:  Carol Ada, MD  Cardiologist:  Evalina Field, MD  Electrophysiologist:  None   Referring MD: Midge Minium, MD   No chief complaint on file. ***  History of Present Illness:   Margaret Torres is a 77 y.o. female with a hx of HTN, PACs, CVA who presents for follow-up. Admitted 09/2022 for L MCA stroke. Loop recorder recommended but patient refused.   Problem List HTN PACs CVA -L MCA -chronic microvascular disease  4. HLD -T chol 133, LDL 85, HDL 40, TG 40  Past Medical History: Past Medical History:  Diagnosis Date   Allergic rhinitis 07/16/2007   Carbon monoxide exposure    Dizziness    Dysphagia 03/02/2016   Essential hypertension 07/16/2007   First degree hemorrhoids 11/28/2020   Generalized abdominal pain 11/28/2020   GERD (gastroesophageal reflux disease) 10/12/2018   Hemorrhage of rectum and anus 11/28/2020   History of cervical cancer    HTN (hypertension)    Hyperlipidemia 06/26/2007   Hyperthyroidism 04/21/2016   hx of thyroid nodule   Insomnia 06/20/2014   Left anterior knee pain 09/30/2014   Leg pain, bilateral 03/02/2016   Major depressive disorder 07/16/2007   Mild cognitive impairment with memory loss 05/14/2022   Multiple thyroid nodules 12/30/2017   Olecranon bursitis of left elbow 01/30/2016   Primary osteoarthritis of both knees 10/20/2018   RBBB    Second degree hemorrhoids 11/28/2020   Vitamin D deficiency 03/19/2022   Wheezing 11/17/2020    Past Surgical History: Past Surgical History:  Procedure Laterality Date   ABDOMINAL HYSTERECTOMY     WISDOM TOOTH EXTRACTION Bilateral     Current Medications: No outpatient medications have been marked as taking for the 12/03/22 encounter (Appointment) with Geralynn Rile, MD.     Allergies:    Pneumococcal vaccine and Influenza vaccines   Social  History: Social History   Socioeconomic History   Marital status: Divorced    Spouse name: Not on file   Number of children: 3   Years of education: 13   Highest education level: Some college, no degree  Occupational History   Occupation: Retired     Comment: sales/marketing  Tobacco Use   Smoking status: Never   Smokeless tobacco: Never  Vaping Use   Vaping Use: Never used  Substance and Sexual Activity   Alcohol use: No   Drug use: No   Sexual activity: Not Currently    Birth control/protection: Post-menopausal  Other Topics Concern   Not on file  Social History Narrative   Hobbies: enjoys fishing    1 Daughter that lives locally    1 Son lives in [place]    3 [place]    1 Son deceased    Right handed    Lives at home alone   Social Determinants of Health   Financial Resource Strain: Low Risk  (03/25/2022)   Overall Financial Resource Strain (CARDIA)    Difficulty of Paying Living Expenses: Not hard at all  Food Insecurity: No Food Insecurity (10/01/2022)   Hunger Vital Sign    Worried About Running Out of Food in the Last Year: Never true    Ran Out of Food in the Last Year: Never true  Transportation Needs: No Transportation Needs (10/01/2022)   PRAPARE - Hydrologist (Medical): No  Lack of Transportation (Non-Medical): No  Physical Activity: Insufficiently Active (03/25/2022)   Exercise Vital Sign    Days of Exercise per Week: 3 days    Minutes of Exercise per Session: 30 min  Stress: No Stress Concern Present (03/25/2022)   [place]    Feeling of Stress : Not at all  Social Connections: Socially Isolated (03/25/2022)   Social Connection and Isolation Panel [NHANES]    Frequency of Communication with Friends and Family: Three times a week    Frequency of Social Gatherings with Friends and Family: Three times a week    Attends Religious Services: Never    Active  Member of Clubs or Organizations: No    Attends Music therapist: Never    Marital Status: Divorced     Family History: The patient's ***family history includes Autism spectrum disorder in an other family member; Breast cancer in her sister; Cancer in her sister and another family member; Cervical cancer in her mother; Diabetes in her brother and sister; Heart attack in her son; Heart disease in her brother; Hypertension in her father and mother; Stroke in her maternal grandmother and sister. There is no history of Thyroid disease.  ROS:   All other ROS reviewed and negative. Pertinent positives noted in the HPI.     EKGs/Labs/Other Studies Reviewed:   The following studies were personally reviewed by me today:  EKG:  EKG is *** ordered today.  The ekg ordered today demonstrates ***, and was personally reviewed by me.   TTE 10/20/2021  1. Left ventricular ejection fraction, by estimation, is 60 to 65%. The  left ventricle has normal function. The left ventricle has no regional  wall motion abnormalities. Left ventricular diastolic parameters are  consistent with Grade I diastolic  dysfunction (impaired relaxation).   2. Right ventricular systolic function is normal. The right ventricular  size is normal. There is severely elevated pulmonary artery systolic  pressure.   3. Left atrial size was mildly dilated.   4. The mitral valve is normal in structure. Mild mitral valve  regurgitation. No evidence of mitral stenosis.   5. The aortic valve is normal in structure. Aortic valve regurgitation is  not visualized. No aortic stenosis is present.   6. The inferior vena cava is normal in size with greater than 50%  respiratory variability, suggesting right atrial pressure of 3 mmHg.   Recent Labs: 06/03/2022: TSH 1.02 06/23/2022: Magnesium 2.2 09/28/2022: ALT 31; BUN 15; Creatinine, Ser 1.00; Hemoglobin 11.5; Platelets 253; Potassium 3.4; Sodium 140   Recent Lipid Panel     Component Value Date/Time   CHOL 133 09/29/2022 0351   TRIG 40 09/29/2022 0351   HDL 40 (L) 09/29/2022 0351   CHOLHDL 3.3 09/29/2022 0351   VLDL 8 09/29/2022 0351   LDLCALC 85 09/29/2022 0351    Physical Exam:   VS:  LMP 06/30/2013    Wt Readings from Last 3 Encounters:  10/22/22 140 lb 12.8 oz (63.9 kg)  09/28/22 140 lb 6.9 oz (63.7 kg)  09/24/22 146 lb 9.7 oz (66.5 kg)    General: Well nourished, well developed, in no acute distress Head: Atraumatic, normal size  Eyes: PEERLA, EOMI  Neck: Supple, no JVD Endocrine: No thryomegaly Cardiac: Normal S1, S2; RRR; no murmurs, rubs, or gallops Lungs: Clear to auscultation bilaterally, no wheezing, rhonchi or rales  Abd: Soft, nontender, no hepatomegaly  Ext: No edema, pulses 2+ Musculoskeletal: No deformities, BUE  and BLE strength normal and equal Skin: Warm and dry, no rashes   Neuro: Alert and oriented to person, place, time, and situation, CNII-XII grossly intact, no focal deficits  Psych: Normal mood and affect   ASSESSMENT:   Roman Senger is a 77 y.o. female who presents for the following: No diagnosis found.  PLAN:   There are no diagnoses linked to this encounter.  {Are you ordering a CV Procedure (e.g. stress test, cath, DCCV, TEE, etc)?   Press F2        :[id number]  Disposition: No follow-ups on file.  Medication Adjustments/Labs and Tests Ordered: Current medicines are reviewed at length with the patient today.  Concerns regarding medicines are outlined above.  No orders of the defined types were placed in this encounter.  No orders of the defined types were placed in this encounter.   There are no Patient Instructions on file for this visit.   Time Spent with Patient: I have spent a total of *** minutes with patient reviewing hospital notes, telemetry, EKGs, labs and examining the patient as well as establishing an assessment and plan that was discussed with the patient.  > 50% of time was spent in direct  patient care.  Signed, Addison Naegeli. Audie Box, MD, [facility]  [address] [place], [place] [address] [phone]  11/30/2022 12:13 PM

## 2022-12-01 ENCOUNTER — Encounter (HOSPITAL_COMMUNITY): Payer: Self-pay

## 2022-12-01 ENCOUNTER — Ambulatory Visit (HOSPITAL_COMMUNITY): Payer: Medicare HMO

## 2022-12-03 ENCOUNTER — Ambulatory Visit: Payer: Medicare HMO | Admitting: Cardiovascular Disease

## 2022-12-09 ENCOUNTER — Ambulatory Visit: Payer: Medicare HMO | Admitting: Family Medicine

## 2022-12-14 DIAGNOSIS — F439 Reaction to severe stress, unspecified: Secondary | ICD-10-CM | POA: Diagnosis not present

## 2022-12-14 DIAGNOSIS — I10 Essential (primary) hypertension: Secondary | ICD-10-CM | POA: Diagnosis not present

## 2022-12-14 DIAGNOSIS — R634 Abnormal weight loss: Secondary | ICD-10-CM | POA: Diagnosis not present

## 2022-12-14 DIAGNOSIS — I679 Cerebrovascular disease, unspecified: Secondary | ICD-10-CM | POA: Diagnosis not present

## 2023-01-06 ENCOUNTER — Other Ambulatory Visit: Payer: Self-pay

## 2023-01-06 NOTE — Patient Outreach (Signed)
Telephone outreach to patient to obtain mRS was successfully completed. MRS= 1  Kellsey Sansone [facility] Care Management Assistant [phone] 

## 2023-01-13 DIAGNOSIS — E559 Vitamin D deficiency, unspecified: Secondary | ICD-10-CM | POA: Diagnosis not present

## 2023-01-13 DIAGNOSIS — I10 Essential (primary) hypertension: Secondary | ICD-10-CM | POA: Diagnosis not present

## 2023-01-13 DIAGNOSIS — D649 Anemia, unspecified: Secondary | ICD-10-CM | POA: Diagnosis not present

## 2023-01-13 DIAGNOSIS — I679 Cerebrovascular disease, unspecified: Secondary | ICD-10-CM | POA: Diagnosis not present

## 2023-01-13 DIAGNOSIS — L290 Pruritus ani: Secondary | ICD-10-CM | POA: Diagnosis not present

## 2023-01-17 DIAGNOSIS — L290 Pruritus ani: Secondary | ICD-10-CM | POA: Diagnosis not present

## 2023-01-19 DIAGNOSIS — D509 Iron deficiency anemia, unspecified: Secondary | ICD-10-CM | POA: Diagnosis not present

## 2023-02-07 ENCOUNTER — Other Ambulatory Visit: Payer: Self-pay | Admitting: Family Medicine

## 2023-02-07 DIAGNOSIS — H8110 Benign paroxysmal vertigo, unspecified ear: Secondary | ICD-10-CM

## 2023-02-14 DIAGNOSIS — I499 Cardiac arrhythmia, unspecified: Secondary | ICD-10-CM | POA: Diagnosis not present

## 2023-02-14 DIAGNOSIS — R300 Dysuria: Secondary | ICD-10-CM | POA: Diagnosis not present

## 2023-02-14 DIAGNOSIS — R634 Abnormal weight loss: Secondary | ICD-10-CM | POA: Diagnosis not present

## 2023-02-15 DIAGNOSIS — H2513 Age-related nuclear cataract, bilateral: Secondary | ICD-10-CM | POA: Diagnosis not present

## 2023-02-15 DIAGNOSIS — H524 Presbyopia: Secondary | ICD-10-CM | POA: Diagnosis not present

## 2023-02-15 DIAGNOSIS — Z0100 Encounter for examination of eyes and vision without abnormal findings: Secondary | ICD-10-CM | POA: Diagnosis not present

## 2023-03-28 DIAGNOSIS — I10 Essential (primary) hypertension: Secondary | ICD-10-CM | POA: Diagnosis not present

## 2023-03-28 DIAGNOSIS — R4589 Other symptoms and signs involving emotional state: Secondary | ICD-10-CM | POA: Diagnosis not present

## 2023-04-22 ENCOUNTER — Ambulatory Visit: Payer: Medicare HMO | Admitting: Physician Assistant

## 2023-04-22 ENCOUNTER — Encounter: Payer: Self-pay | Admitting: Physician Assistant

## 2023-04-22 DIAGNOSIS — Z029 Encounter for administrative examinations, unspecified: Secondary | ICD-10-CM

## 2023-05-10 DIAGNOSIS — R1013 Epigastric pain: Secondary | ICD-10-CM | POA: Diagnosis not present

## 2023-05-10 DIAGNOSIS — Z8619 Personal history of other infectious and parasitic diseases: Secondary | ICD-10-CM | POA: Diagnosis not present

## 2023-05-12 DIAGNOSIS — R1013 Epigastric pain: Secondary | ICD-10-CM | POA: Diagnosis not present

## 2023-05-12 DIAGNOSIS — Z8619 Personal history of other infectious and parasitic diseases: Secondary | ICD-10-CM | POA: Diagnosis not present

## 2023-06-29 DIAGNOSIS — E7800 Pure hypercholesterolemia, unspecified: Secondary | ICD-10-CM | POA: Diagnosis not present

## 2023-06-29 DIAGNOSIS — Z9181 History of falling: Secondary | ICD-10-CM | POA: Diagnosis not present

## 2023-06-29 DIAGNOSIS — F321 Major depressive disorder, single episode, moderate: Secondary | ICD-10-CM | POA: Diagnosis not present

## 2023-06-29 DIAGNOSIS — L299 Pruritus, unspecified: Secondary | ICD-10-CM | POA: Diagnosis not present

## 2023-06-29 DIAGNOSIS — I10 Essential (primary) hypertension: Secondary | ICD-10-CM | POA: Diagnosis not present

## 2023-06-29 DIAGNOSIS — F411 Generalized anxiety disorder: Secondary | ICD-10-CM | POA: Diagnosis not present

## 2023-06-29 DIAGNOSIS — I679 Cerebrovascular disease, unspecified: Secondary | ICD-10-CM | POA: Diagnosis not present

## 2023-06-29 DIAGNOSIS — Z0000 Encounter for general adult medical examination without abnormal findings: Secondary | ICD-10-CM | POA: Diagnosis not present

## 2023-07-18 DIAGNOSIS — Z09 Encounter for follow-up examination after completed treatment for conditions other than malignant neoplasm: Secondary | ICD-10-CM | POA: Diagnosis not present

## 2023-07-18 DIAGNOSIS — Z8619 Personal history of other infectious and parasitic diseases: Secondary | ICD-10-CM | POA: Diagnosis not present

## 2023-07-20 DIAGNOSIS — E039 Hypothyroidism, unspecified: Secondary | ICD-10-CM | POA: Diagnosis not present

## 2023-07-21 DIAGNOSIS — Z8619 Personal history of other infectious and parasitic diseases: Secondary | ICD-10-CM | POA: Diagnosis not present

## 2023-08-09 ENCOUNTER — Encounter: Payer: Self-pay | Admitting: Emergency Medicine

## 2023-08-09 ENCOUNTER — Other Ambulatory Visit: Payer: Self-pay

## 2023-08-09 ENCOUNTER — Ambulatory Visit
Admission: EM | Admit: 2023-08-09 | Discharge: 2023-08-09 | Disposition: A | Discharge status: Home / Self Care | Payer: Medicare HMO | Attending: Internal Medicine | Admitting: Internal Medicine

## 2023-08-09 DIAGNOSIS — R42 Dizziness and giddiness: Secondary | ICD-10-CM | POA: Diagnosis not present

## 2023-08-09 DIAGNOSIS — R350 Frequency of micturition: Secondary | ICD-10-CM | POA: Insufficient documentation

## 2023-08-09 LAB — POCT URINALYSIS DIP (MANUAL ENTRY)
Bilirubin, UA: NEGATIVE
Glucose, UA: NEGATIVE mg/dL
Ketones, POC UA: NEGATIVE mg/dL
Nitrite, UA: NEGATIVE
Protein Ur, POC: NEGATIVE mg/dL
Spec Grav, UA: 1.01 (ref 1.010–1.025)
Urobilinogen, UA: 0.2 U/dL
pH, UA: 6.5 (ref 5.0–8.0)

## 2023-08-09 LAB — POCT FASTING CBG KUC MANUAL ENTRY: POCT Glucose (KUC): 84 mg/dL (ref 70–99)

## 2023-08-09 NOTE — ED Provider Notes (Signed)
[name]    CSN: [id number] Arrival date & time: 08/09/23  1408      History   Chief Complaint Chief Complaint  Patient presents with   Dizziness    HPI Margaret Torres is a 77 y.o. female.   Patient presents with dizziness that has been intermittent for the past 1 to 2 weeks.  Patient states that she has history of vertigo previously.  Denies any recent falls or significant head injury but does report that she accidentally hit herself in the head with the handle of a rake recently.  Although, dizziness was present prior to this.  Denies headache, blurred vision, nausea, vomiting, chest pain, shortness of breath.  Denies dysuria.  Reports that she has urinary frequency at baseline at night which is unchanged.  She does report previous history of CVA but denies that she takes any blood thinning medications.  Reports the dizziness resolves when she lies flat.   Dizziness   Past Medical History:  Diagnosis Date   Allergic rhinitis 07/16/2007   Carbon monoxide exposure    Dizziness    Dysphagia 03/02/2016   Essential hypertension 07/16/2007   First degree hemorrhoids 11/28/2020   Generalized abdominal pain 11/28/2020   GERD (gastroesophageal reflux disease) 10/12/2018   Hemorrhage of rectum and anus 11/28/2020   History of cervical cancer    HTN (hypertension)    Hyperlipidemia 06/26/2007   Hyperthyroidism 04/21/2016   hx of thyroid nodule   Insomnia 06/20/2014   Left anterior knee pain 09/30/2014   Leg pain, bilateral 03/02/2016   Major depressive disorder 07/16/2007   Mild cognitive impairment with memory loss 05/14/2022   Multiple thyroid nodules 12/30/2017   Olecranon bursitis of left elbow 01/30/2016   Primary osteoarthritis of both knees 10/20/2018   RBBB    Second degree hemorrhoids 11/28/2020   Vitamin D deficiency 03/19/2022   Wheezing 11/17/2020    Patient Active Problem List   Diagnosis Date Noted   Left MCA territory branch infarct  09/28/2022   Formication 09/24/2022   Generalized anxiety disorder 09/24/2022   RBBB 06/23/2022   Abnormal EKG 06/23/2022   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   Irregular heart beat 06/05/2022   Carbon monoxide exposure    Mild cognitive impairment with memory loss 05/14/2022   Vitamin D deficiency 03/19/2022   Abnormal feces 11/28/2020   First degree hemorrhoids 11/28/2020   Generalized abdominal pain 11/28/2020   Second degree hemorrhoids 11/28/2020   Hemorrhage of rectum and anus 11/28/2020   Wheezing 11/17/2020   Primary osteoarthritis of both knees 10/20/2018   GERD (gastroesophageal reflux disease) 10/12/2018   Multiple thyroid nodules 12/30/2017   Diarrhea 09/20/2016   Hyperthyroidism 04/21/2016   Bowel incontinence 04/21/2016   Leg pain, bilateral 03/02/2016   Dysphagia 03/02/2016   Olecranon bursitis of left elbow 01/30/2016   Physical exam 01/27/2015   Bilateral renal cysts 01/27/2015   Left anterior knee pain 09/30/2014   Insomnia 06/20/2014   Hemorrhoids, external 03/27/2014   Major depressive disorder 07/16/2007   Essential hypertension 07/16/2007   Allergic rhinitis 07/16/2007   Hyperlipidemia 06/26/2007   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      Home Medications    Prior to Admission medications   Medication Sig Start Date End  Date Taking? Authorizing Provider  amLODipine (NORVASC) 5 MG tablet Take 1 tablet (5 mg total) by mouth daily as needed (SBP>140). 08/06/22   Sharlene Dory, NP  ammonium lactate (LAC-HYDRIN) 12 % lotion Apply topically 2 (two) times daily. 10/13/22   [provider]  aspirin EC 81 MG tablet Take 1 tablet (81 mg total) by mouth daily. Swallow whole. 10/01/22   Elmer Picker, NP  atorvastatin (LIPITOR) 10 MG tablet Take 1  tablet (10 mg total) by mouth daily. Patient not taking: Reported on 10/22/2022 10/01/22   Elmer Picker, NP  Cholecalciferol (VITAMIN D3) 10 MCG (400 UNIT) CAPS Take by mouth.    [provider]  CLENPIQ 10-3.5-12 MG-GM -GM/175ML SOLN Take by mouth. Patient not taking: Reported on 10/22/2022 06/22/22   [provider]  clonazePAM (KLONOPIN) 0.5 MG tablet Take 1 tablet (0.5 mg total) by mouth 2 (two) times daily as needed. 09/28/22   Sheliah Hatch, MD  clopidogrel (PLAVIX) 75 MG tablet Take 1 tablet (75 mg total) by mouth daily. 10/01/22   Elmer Picker, NP  fluticasone Aleda Grana) 50 MCG/ACT nasal spray Use 2 spray(s) in each nostril once daily 08/13/22   Sheliah Hatch, MD  hydrOXYzine (ATARAX) 25 MG tablet Take 25 mg by mouth at bedtime. 10/13/22   [provider]  loratadine (CLARITIN) 10 MG tablet Take 1 tablet (10 mg total) by mouth daily as needed for allergies. Take 10 mg by mouth daily as needed for allergies. 10/26/21   Sheliah Hatch, MD  meclizine (ANTIVERT) 25 MG tablet TAKE 1 TABLET BY MOUTH THREE TIMES DAILY AS NEEDED FOR DIZZINESS 09/20/22   Sheliah Hatch, MD  olopatadine (PATADAY) 0.1 % ophthalmic solution 1 drop 2 (two) times daily.    [provider]  omeprazole (PRILOSEC) 20 MG capsule Take 20 mg by mouth 2 (two) times daily. Patient not taking: Reported on 10/22/2022 07/10/22   [provider]  permethrin (ELIMITE) 5 % cream Apply to affected area once 09/20/22   Sheliah Hatch, MD  polyethylene glycol powder (GLYCOLAX/MIRALAX) 17 GM/SCOOP powder Take 17 g by mouth 2 (two) times daily as needed. 05/26/22   Gustavus Bryant, FNP  sucralfate (CARAFATE) 1 g tablet Take 1 tablet (1 g total) by mouth 3 (three) times daily with meals. 03/19/22   Sheliah Hatch, MD  triamcinolone cream (KENALOG) 0.1 % Apply 1 Application topically 2 (two) times daily. 08/26/22   Tomi Bamberger, PA-C    Family History Family History   Problem Relation Age of Onset   Cervical cancer Mother    Hypertension Mother    Hypertension Father    Diabetes Sister    Stroke Sister    Cancer Sister        bone cancer   Breast cancer Sister    Heart disease Brother    Diabetes Brother    Stroke Maternal Grandmother    Heart attack Son    Cancer Other    Autism spectrum disorder Other    Thyroid disease Neg Hx     Social History Social History   Tobacco Use   Smoking status: Never   Smokeless tobacco: Never  Vaping Use   Vaping status: Never Used  Substance Use Topics   Alcohol use: No   Drug use: No     Allergies   Pneumococcal vaccine and Influenza vaccines   Review of Systems Review of Systems Per HPI  Physical Exam Triage Vital Signs ED Triage  Vitals  Encounter Vitals Group     BP 08/09/23 1515 (!) 149/84     Systolic BP Percentile --      Diastolic BP Percentile --      Pulse Rate 08/09/23 1515 72     Resp 08/09/23 1515 18     Temp 08/09/23 1515 98.7 F (37.1 C)     Temp Source 08/09/23 1515 Oral     SpO2 08/09/23 1515 96 %     Weight --      Height --      Head Circumference --      Peak Flow --      Pain Score 08/09/23 1516 0     Pain Loc --      Pain Education --      Exclude from Growth Chart --    No data found.  Updated Vital Signs BP (!) 149/84 (BP Location: Left Arm)   Pulse 72   Temp 98.7 F (37.1 C) (Oral)   Resp 18   LMP 06/30/2013   SpO2 96%   Visual Acuity Right Eye Distance:   Left Eye Distance:   Bilateral Distance:    Right Eye Near:   Left Eye Near:    Bilateral Near:     Physical Exam Constitutional:      General: She is not in acute distress.    Appearance: Normal appearance. She is not toxic-appearing or diaphoretic.  HENT:     Head: Normocephalic and atraumatic.     Right Ear: Tympanic membrane and ear canal normal.     Left Ear: Tympanic membrane and ear canal normal.  Eyes:     Extraocular Movements: Extraocular movements intact.      Conjunctiva/sclera: Conjunctivae normal.     Pupils: Pupils are equal, round, and reactive to light.  Cardiovascular:     Rate and Rhythm: Normal rate and regular rhythm.     Pulses: Normal pulses.     Heart sounds: Normal heart sounds.  Pulmonary:     Effort: Pulmonary effort is normal. No respiratory distress.     Breath sounds: Normal breath sounds.  Neurological:     General: No focal deficit present.     Mental Status: She is alert and oriented to person, place, and time. Mental status is at baseline.     Cranial Nerves: Cranial nerves 2-12 are intact.     Sensory: Sensation is intact.     Motor: Motor function is intact.     Coordination: Coordination is intact.     Gait: Gait is intact.  Psychiatric:        Mood and Affect: Mood normal.        Behavior: Behavior normal.        Thought Content: Thought content normal.        Judgment: Judgment normal.      UC Treatments / Results  Labs (all labs ordered are listed, but only abnormal results are displayed) Labs Reviewed  POCT URINALYSIS DIP (MANUAL ENTRY) - Abnormal; Notable for the following components:      Result Value   Blood, UA small (*)    Leukocytes, UA Small (1+) (*)    All other components within normal limits  URINE CULTURE  BASIC METABOLIC PANEL  CBC  POCT FASTING CBG [facility] MANUAL ENTRY    EKG   Radiology No results found.  Procedures Procedures (including critical care time)  Medications Ordered in UC Medications - No data to display  Initial  Impression / Assessment and Plan / UC Course  I have reviewed the triage vital signs and the nursing notes.  Pertinent labs & imaging results that were available during my care of the patient were reviewed by me and considered in my medical decision making (see chart for details).     Advised patient given history of CVA and dizziness that it is recommended that she go to the emergency department today as she may need imaging and more extensive  evaluation which cannot be provided here at urgent care.  Patient was adamant that she was not going to the emergency department.  Risks associated with not going to the ER were discussed with patient.  Patient voiced understanding and accepted risks.  Given patient is declining ER evaluation, will do very limited workup here in urgent care.  Blood glucose unremarkable.  EKG seems unremarkable when compared to previous EKGs.  UA shows small amount of leukocytes but given patient is denying any change in urinary frequency, will send urine culture to confirm.  BMP and CBC pending.  Awaiting results.  Patient was advised to follow-up with her PCP and neurologist as soon as possible and was given strict ER precautions.  Patient verbalized understanding and was agreeable with plan. Final Clinical Impressions(s) / UC Diagnoses   Final diagnoses:  Dizziness and giddiness  Urinary frequency     Discharge Instructions      Urine test and blood work are pending.  We will call if they are abnormal.  Please follow-up with your primary care doctor and neurologist as soon as possible.  Also recommend that you go to the emergency department if symptoms persist or worsen.    ED Prescriptions   None    PDMP not reviewed this encounter.   Gustavus Bryant, [place] 08/09/23 [phone]

## 2023-08-09 NOTE — ED Triage Notes (Signed)
Pt sts dizziness intermittently x 1 week; pt sts notices when she is walking

## 2023-08-09 NOTE — Discharge Instructions (Signed)
Urine test and blood work are pending.  We will call if they are abnormal.  Please follow-up with your primary care doctor and neurologist as soon as possible.  Also recommend that you go to the emergency department if symptoms persist or worsen.

## 2023-08-10 LAB — BASIC METABOLIC PANEL
BUN/Creatinine Ratio: 18 (ref 12–28)
BUN: 22 mg/dL (ref 8–27)
CO2: 22 mmol/L (ref 20–29)
Calcium: 9.4 mg/dL (ref 8.7–10.3)
Chloride: 103 mmol/L (ref 96–106)
Creatinine, Ser: 1.19 mg/dL — ABNORMAL HIGH (ref 0.57–1.00)
Glucose: 85 mg/dL (ref 70–99)
Potassium: 3.9 mmol/L (ref 3.5–5.2)
Sodium: 140 mmol/L (ref 134–144)
eGFR: 47 mL/min/{1.73_m2} — ABNORMAL LOW (ref >59)

## 2023-08-10 LAB — CBC
Hematocrit: 32.7 % — ABNORMAL LOW (ref 34.0–46.6)
Hemoglobin: 10.7 g/dL — ABNORMAL LOW (ref 11.1–15.9)
MCH: 29.9 pg (ref 26.6–33.0)
MCHC: 32.7 g/dL (ref 31.5–35.7)
MCV: 91 fL (ref 79–97)
Platelets: 256 10*3/uL (ref 150–450)
RBC: 3.58 x10E6/uL — ABNORMAL LOW (ref 3.77–5.28)
RDW: 14 % (ref 11.7–15.4)
WBC: 6.4 10*3/uL (ref 3.4–10.8)

## 2023-08-10 LAB — URINE CULTURE: Culture: <10000 — AB

## 2023-08-21 ENCOUNTER — Emergency Department (HOSPITAL_COMMUNITY)
Admission: EM | Admit: 2023-08-21 | Discharge: 2023-08-21 | Disposition: A | Discharge status: Home / Self Care | Payer: Medicare HMO | Attending: Emergency Medicine | Admitting: Emergency Medicine

## 2023-08-21 ENCOUNTER — Encounter (HOSPITAL_COMMUNITY): Payer: Self-pay

## 2023-08-21 ENCOUNTER — Other Ambulatory Visit: Payer: Self-pay

## 2023-08-21 ENCOUNTER — Emergency Department (HOSPITAL_COMMUNITY): Payer: Medicare HMO

## 2023-08-21 DIAGNOSIS — I6782 Cerebral ischemia: Secondary | ICD-10-CM | POA: Diagnosis not present

## 2023-08-21 DIAGNOSIS — Z7982 Long term (current) use of aspirin: Secondary | ICD-10-CM | POA: Insufficient documentation

## 2023-08-21 DIAGNOSIS — G9389 Other specified disorders of brain: Secondary | ICD-10-CM | POA: Diagnosis not present

## 2023-08-21 DIAGNOSIS — I10 Essential (primary) hypertension: Secondary | ICD-10-CM | POA: Diagnosis not present

## 2023-08-21 DIAGNOSIS — R42 Dizziness and giddiness: Secondary | ICD-10-CM | POA: Diagnosis not present

## 2023-08-21 DIAGNOSIS — R29818 Other symptoms and signs involving the nervous system: Secondary | ICD-10-CM | POA: Diagnosis not present

## 2023-08-21 DIAGNOSIS — R9082 White matter disease, unspecified: Secondary | ICD-10-CM | POA: Diagnosis not present

## 2023-08-21 DIAGNOSIS — Z7901 Long term (current) use of anticoagulants: Secondary | ICD-10-CM | POA: Diagnosis not present

## 2023-08-21 DIAGNOSIS — E039 Hypothyroidism, unspecified: Secondary | ICD-10-CM | POA: Insufficient documentation

## 2023-08-21 DIAGNOSIS — Z8673 Personal history of transient ischemic attack (TIA), and cerebral infarction without residual deficits: Secondary | ICD-10-CM | POA: Diagnosis not present

## 2023-08-21 LAB — DIFFERENTIAL
Abs Immature Granulocytes: 0.02 10*3/uL (ref 0.00–0.07)
Basophils Absolute: 0.1 10*3/uL (ref 0.0–0.1)
Basophils Relative: 1 %
Eosinophils Absolute: 0.1 10*3/uL (ref 0.0–0.5)
Eosinophils Relative: 3 %
Immature Granulocytes: 0 %
Lymphocytes Relative: 29 %
Lymphs Abs: 1.5 10*3/uL (ref 0.7–4.0)
Monocytes Absolute: 0.2 10*3/uL (ref 0.1–1.0)
Monocytes Relative: 4 %
Neutro Abs: 3.2 10*3/uL (ref 1.7–7.7)
Neutrophils Relative %: 63 %

## 2023-08-21 LAB — I-STAT CHEM 8, ED
BUN: 20 mg/dL (ref 8–23)
Calcium, Ion: 1.11 mmol/L — ABNORMAL LOW (ref 1.15–1.40)
Chloride: 101 mmol/L (ref 98–111)
Creatinine, Ser: 1.3 mg/dL — ABNORMAL HIGH (ref 0.44–1.00)
Glucose, Bld: 110 mg/dL — ABNORMAL HIGH (ref 70–99)
HCT: 36 % (ref 36.0–46.0)
Hemoglobin: 12.2 g/dL (ref 12.0–15.0)
Potassium: 3.4 mmol/L — ABNORMAL LOW (ref 3.5–5.1)
Sodium: 137 mmol/L (ref 135–145)
TCO2: 26 mmol/L (ref 22–32)

## 2023-08-21 LAB — COMPREHENSIVE METABOLIC PANEL
ALT: 72 U/L — ABNORMAL HIGH (ref 0–44)
AST: 71 U/L — ABNORMAL HIGH (ref 15–41)
Albumin: 4 g/dL (ref 3.5–5.0)
Alkaline Phosphatase: 50 U/L (ref 38–126)
Anion gap: 10 (ref 5–15)
BUN: 18 mg/dL (ref 8–23)
CO2: 24 mmol/L (ref 22–32)
Calcium: 9.2 mg/dL (ref 8.9–10.3)
Chloride: 101 mmol/L (ref 98–111)
Creatinine, Ser: 1.19 mg/dL — ABNORMAL HIGH (ref 0.44–1.00)
GFR, Estimated: 47 mL/min — ABNORMAL LOW (ref >60)
Glucose, Bld: 111 mg/dL — ABNORMAL HIGH (ref 70–99)
Potassium: 3.3 mmol/L — ABNORMAL LOW (ref 3.5–5.1)
Sodium: 135 mmol/L (ref 135–145)
Total Bilirubin: 0.6 mg/dL (ref <1.2)
Total Protein: 7.4 g/dL (ref 6.5–8.1)

## 2023-08-21 LAB — CBC
HCT: 32.7 % — ABNORMAL LOW (ref 36.0–46.0)
Hemoglobin: 11 g/dL — ABNORMAL LOW (ref 12.0–15.0)
MCH: 29.8 pg (ref 26.0–34.0)
MCHC: 33.6 g/dL (ref 30.0–36.0)
MCV: 88.6 fL (ref 80.0–100.0)
Platelets: 288 10*3/uL (ref 150–400)
RBC: 3.69 MIL/uL — ABNORMAL LOW (ref 3.87–5.11)
RDW: 14.7 % (ref 11.5–15.5)
WBC: 5.1 10*3/uL (ref 4.0–10.5)
nRBC: 0 % (ref 0.0–0.2)

## 2023-08-21 LAB — ETHANOL: Alcohol, Ethyl (B): <10 mg/dL (ref <10)

## 2023-08-21 LAB — PROTIME-INR
INR: 1 (ref 0.8–1.2)
Prothrombin Time: 13.5 s (ref 11.4–15.2)

## 2023-08-21 LAB — APTT: aPTT: 36 s (ref 24–36)

## 2023-08-21 LAB — CBG MONITORING, ED: Glucose-Capillary: 116 mg/dL — ABNORMAL HIGH (ref 70–99)

## 2023-08-21 MED ORDER — SODIUM CHLORIDE 0.9% FLUSH
3.0000 mL | Freq: Once | INTRAVENOUS | Status: AC
  Administered 2023-08-21: 3 mL via INTRAVENOUS

## 2023-08-21 MED ORDER — LORAZEPAM 2 MG/ML IJ SOLN
1.0000 mg | INTRAMUSCULAR | Status: AC
  Administered 2023-08-21: 1 mg via INTRAVENOUS
  Filled 2023-08-21: qty 1

## 2023-08-21 MED ORDER — MECLIZINE HCL 25 MG PO TABS
25.0000 mg | ORAL_TABLET | Freq: Once | ORAL | Status: AC
  Administered 2023-08-21: 25 mg via ORAL
  Filled 2023-08-21: qty 1

## 2023-08-21 NOTE — ED Notes (Signed)
MD at bedside. 

## 2023-08-21 NOTE — Discharge Instructions (Addendum)
You were seen for dizziness in the emergency department.   At home, please stay well hydrated.    Check your MyChart online for the results of any tests that had not resulted by the time you left the emergency department.   Follow-up with your primary doctor in 2-3 days regarding your visit.    Return immediately to the emergency department if you experience any of the following: falls, worsening dizziness, or any other concerning symptoms.    Thank you for visiting our Emergency Department. It was a pleasure taking care of you today.

## 2023-08-21 NOTE — ED Provider Notes (Signed)
[place] EMERGENCY DEPARTMENT AT [facility] Provider Note   CSN: [id number] Arrival date & time: 08/21/23  0857     History  Chief Complaint  Patient presents with   Dizziness   Gait Problem    Margaret Torres is a 77 y.o. female.  77 year old female with history of stroke on aspirin and Plavix, hypertension, hyperlipidemia, and hypothyroidism who presents emergency department with dizziness.  Patient reports that since October 31 she has felt like she is drunk when she is walking.  Says that she does not drink alcohol at all.  No hearing loss or tinnitus.  Describes it as a lightheaded sensation worst when she tries to walk.  Also says it gets worse when she stands.  Does have an occipital headache that his been present since then as well.  Says that she has increased urination but no incontinence.        Home Medications Prior to Admission medications   Medication Sig Start Date End Date Taking? Authorizing Provider  amLODipine (NORVASC) 5 MG tablet Take 1 tablet (5 mg total) by mouth daily as needed (SBP>140). 08/06/22   Sharlene Dory, NP  ammonium lactate (LAC-HYDRIN) 12 % lotion Apply topically 2 (two) times daily. 10/13/22   [provider]  aspirin EC 81 MG tablet Take 1 tablet (81 mg total) by mouth daily. Swallow whole. 10/01/22   Elmer Picker, NP  atorvastatin (LIPITOR) 10 MG tablet Take 1 tablet (10 mg total) by mouth daily. Patient not taking: Reported on 10/22/2022 10/01/22   Elmer Picker, NP  Cholecalciferol (VITAMIN D3) 10 MCG (400 UNIT) CAPS Take by mouth.    [provider]  CLENPIQ 10-3.5-12 MG-GM -GM/175ML SOLN Take by mouth. Patient not taking: Reported on 10/22/2022 06/22/22   [provider]  clonazePAM (KLONOPIN) 0.5 MG tablet Take 1 tablet (0.5 mg total) by mouth 2 (two) times daily as needed. 09/28/22   Sheliah Hatch, MD  clopidogrel (PLAVIX) 75 MG tablet Take 1 tablet (75 mg total) by mouth daily. 10/01/22    Elmer Picker, NP  fluticasone Aleda Grana) 50 MCG/ACT nasal spray Use 2 spray(s) in each nostril once daily 08/13/22   Sheliah Hatch, MD  hydrOXYzine (ATARAX) 25 MG tablet Take 25 mg by mouth at bedtime. 10/13/22   [provider]  loratadine (CLARITIN) 10 MG tablet Take 1 tablet (10 mg total) by mouth daily as needed for allergies. Take 10 mg by mouth daily as needed for allergies. 10/26/21   Sheliah Hatch, MD  meclizine (ANTIVERT) 25 MG tablet TAKE 1 TABLET BY MOUTH THREE TIMES DAILY AS NEEDED FOR DIZZINESS 09/20/22   Sheliah Hatch, MD  olopatadine (PATADAY) 0.1 % ophthalmic solution 1 drop 2 (two) times daily.    [provider]  omeprazole (PRILOSEC) 20 MG capsule Take 20 mg by mouth 2 (two) times daily. Patient not taking: Reported on 10/22/2022 07/10/22   [provider]  permethrin (ELIMITE) 5 % cream Apply to affected area once 09/20/22   Sheliah Hatch, MD  polyethylene glycol powder (GLYCOLAX/MIRALAX) 17 GM/SCOOP powder Take 17 g by mouth 2 (two) times daily as needed. 05/26/22   Gustavus Bryant, FNP  sucralfate (CARAFATE) 1 g tablet Take 1 tablet (1 g total) by mouth 3 (three) times daily with meals. 03/19/22   Sheliah Hatch, MD  triamcinolone cream (KENALOG) 0.1 % Apply 1 Application topically 2 (two) times daily. 08/26/22   Tomi Bamberger, PA-C  Allergies    Pneumococcal vaccine and Influenza vaccines    Review of Systems   Review of Systems  Physical Exam Updated Vital Signs BP (!) 126/108   Pulse 63   Temp 97.7 F (36.5 C) (Oral)   Resp 17   Ht 5' 4.5" (1.638 m)   Wt 63.5 kg   LMP 06/30/2013   SpO2 98%   BMI 23.66 kg/m  Physical Exam Vitals and nursing note reviewed.  Constitutional:      General: She is not in acute distress.    Appearance: She is well-developed.  HENT:     Head: Normocephalic and atraumatic.     Right Ear: External ear normal.     Left Ear: External ear normal.     Nose: Nose normal.  Eyes:      Extraocular Movements: Extraocular movements intact.     Conjunctiva/sclera: Conjunctivae normal.     Pupils: Pupils are equal, round, and reactive to light.  Cardiovascular:     Rate and Rhythm: Normal rate and regular rhythm.     Heart sounds: No murmur heard. Pulmonary:     Effort: Pulmonary effort is normal. No respiratory distress.     Breath sounds: Normal breath sounds.  Musculoskeletal:     Cervical back: Normal range of motion and neck supple.     Right lower leg: No edema.     Left lower leg: No edema.  Skin:    General: Skin is warm and dry.  Neurological:     Mental Status: She is alert and oriented to person, place, and time. Mental status is at baseline.     Comments: NIHSS Exam  Level of Consciousness: Alert  LOC Questions: Answers Month and Age Correctly  LOC Commands: Opens and Closes Eyes and Hands on command  Best Gaze: Horizontal ocular movements intact  Visual Fields: No visual field loss  Facial Palsy: None  L Upper Extremity Motor: No drift after 10 seconds  R Upper Extremity Motor: No drift after 10 seconds  L Lower extremity Motor: No drift after 5 seconds  R Lower extremity Motor: No drift after 5 seconds  Ataxia: Absent  Sensory: Intact sensation to light touch on face, arms, trunk, and legs bilaterally  Best Language: No aphasia  Dysarthria: No dysarthria  Neglect: No visual or sensory neglect   Psychiatric:        Mood and Affect: Mood normal.     ED Results / Procedures / Treatments   Labs (all labs ordered are listed, but only abnormal results are displayed) Labs Reviewed  CBC - Abnormal; Notable for the following components:      Result Value   RBC 3.69 (*)    Hemoglobin 11.0 (*)    HCT 32.7 (*)    All other components within normal limits  COMPREHENSIVE METABOLIC PANEL - Abnormal; Notable for the following components:   Potassium 3.3 (*)    Glucose, Bld 111 (*)    Creatinine, Ser 1.19 (*)    AST 71 (*)    ALT 72 (*)    GFR,  Estimated 47 (*)    All other components within normal limits  I-STAT CHEM 8, ED - Abnormal; Notable for the following components:   Potassium 3.4 (*)    Creatinine, Ser 1.30 (*)    Glucose, Bld 110 (*)    Calcium, Ion 1.11 (*)    All other components within normal limits  CBG MONITORING, ED - Abnormal; Notable for the following components:  Glucose-Capillary 116 (*)    All other components within normal limits  PROTIME-INR  APTT  DIFFERENTIAL  ETHANOL    EKG EKG Interpretation Date/Time:  Sunday August 21 2023 09:18:52 EST Ventricular Rate:  75 PR Interval:  185 QRS Duration:  165 QT Interval:  449 QTC Calculation: 502 R Axis:   -33  Text Interpretation: Sinus rhythm Ventricular premature complex Right bundle branch block Confirmed by Vonita Moss [phone]) on 08/21/2023 10:21:26 AM  Radiology MR BRAIN WO CONTRAST  Result Date: 08/21/2023 CLINICAL DATA:  77 year old female with persistent dizziness. Neurologic deficit. EXAM: MRI HEAD WITHOUT CONTRAST TECHNIQUE: Multiplanar, multiecho pulse sequences of the brain and surrounding structures were obtained without intravenous contrast. COMPARISON:  Head CT today.  Brain MRI 09/29/2022. FINDINGS: Brain: No convincing diffusion restriction. Subtle asymmetry of the left pons on series 2, image 15 is not correlated on coronal DWI or T2/FLAIR. Small chronic infarcts in both cerebellar hemispheres, mostly AICA and left SCA territories and stable from last year. Patchy bilateral cerebral white matter T2 and FLAIR hyperintensity is moderate in both cerebral hemispheres, and on the left there is mild associated chronic encephalomalacia at the posterior left sylvian fissure, stable. No other cortical encephalomalacia identified. Minimal chronic microhemorrhage in the right basal ganglia and thalamus on SWI. Chronic microhemorrhage in the right cerebellum is unchanged but more conspicuous on SWI than the previous T2 *. No midline shift, mass  effect, evidence of mass lesion, ventriculomegaly, extra-axial collection or acute intracranial hemorrhage. Cervicomedullary junction and pituitary are within normal limits. Vascular: Major intracranial vascular flow voids are preserved. Skull and upper cervical spine: Visualized bone marrow signal is within normal limits. Mild for age visible cervical spine degeneration. Sinuses/Orbits: Negative, susceptibility artifact affecting the left globe. Other: Mastoids remain well aerated. Visible internal auditory structures appear normal. Normal stylomastoid foramina. Negative visible scalp and face. IMPRESSION: 1. No acute intracranial abnormality. 2. 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 Mild chronic microhemorrhages in the brain. And chronic white matter disease. Electronically Signed   By: Odessa Fleming M.D.   On: 08/21/2023 13:04   CT HEAD WO CONTRAST  Result Date: 08/21/2023 CLINICAL DATA:  Acute neurologic deficit. EXAM: CT HEAD WITHOUT CONTRAST TECHNIQUE: Contiguous axial images were obtained from the base of the skull through the vertex without intravenous contrast. RADIATION DOSE REDUCTION: This exam was performed according to the departmental dose-optimization program which includes automated exposure control, adjustment of the mA and/or kV according to patient size and/or use of iterative reconstruction technique. COMPARISON:  CT head dated 09/30/2022. FINDINGS: Brain: No evidence of acute infarction, hemorrhage, hydrocephalus, extra-axial collection or mass lesion/mass effect. Periventricular white matter hypoattenuation likely represents chronic small vessel ischemic disease. Chronic lacunar infarcts in both cerebellar hemispheres are redemonstrated. Vascular: There are vascular calcifications in the carotid siphons. Skull: Normal. Negative for fracture or focal lesion. Sinuses/Orbits: No acute finding. Other: None. IMPRESSION: No acute intracranial process. Electronically Signed    By: Romona Curls M.D.   On: 08/21/2023 10:38    Procedures Procedures    Medications Ordered in ED Medications  sodium chloride flush (NS) 0.9 % injection 3 mL (3 mLs Intravenous Given 08/21/23 0927)  meclizine (ANTIVERT) tablet 25 mg (25 mg Oral Given 08/21/23 1029)  LORazepam (ATIVAN) injection 1 mg (1 mg Intravenous Given 08/21/23 1141)    ED Course/ Medical Decision Making/ A&P Clinical Course as of 08/21/23 1954  Sun Aug 21, 2023  1017 Creatinine(!): 1.19 Baseline 1.05 [RP]  Clinical Course User Index [RP] Rondel Baton, MD                                 Medical Decision Making Amount and/or Complexity of Data Reviewed Labs: ordered. Decision-making details documented in ED Course. Radiology: ordered.  Risk Prescription drug management.   Leonida Hufnagle is a 77 y.o. female with comorbidities that complicate the patient evaluation including stroke aspirin and Plavix, hypertension, hyperlipidemia, and hypothyroidism who presents emergency department with dizziness.   Initial Ddx:  Vertigo, stroke, orthostasis, NPH, anemia  MDM/Course:  Patient presents emergency department with dizziness.  Reports that is more of a lightheaded sensation but says that she feels drunk when walking.  Has a nonfocal neuroexam.  Orthostatics negative.  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  Lab work was also unremarkable did not show anemia or other concerning findings.  Given her history of stroke did obtain an MRI as well which does not show any evidence of acute stroke.  Will have her follow-up with her primary doctor in several days.  Will have her continue taking the meclizine.  Unclear exactly what is causing her symptoms at this time.    This patient presents to the ED for concern of complaints listed in HPI, this involves an extensive number of treatment options, and is a complaint that carries with it a high risk of complications and morbidity. Disposition  including potential need for admission considered.   Dispo: DC Home. Return precautions discussed including, but not limited to, those listed in the AVS. Allowed pt time to ask questions which were answered fully prior to dc.  Records reviewed Outpatient Clinic Notes The following labs were independently interpreted: Chemistry and show CKD I independently reviewed the following imaging with scope of interpretation limited to determining acute life threatening conditions related to emergency care: CT Head and agree with the radiologist interpretation with the following exceptions: none I personally reviewed and interpreted cardiac monitoring: normal sinus rhythm  I personally reviewed and interpreted the pt's EKG: see above for interpretation  I have reviewed the patients home medications and made adjustments as needed Social Determinants of health:  Elderly  Portions of this note were generated with Scientist, clinical (histocompatibility and immunogenetics). Dictation errors may occur despite best attempts at proofreading.           Final Clinical Impression(s) / ED Diagnoses Final diagnoses:  Dizziness    Rx / DC Orders ED Discharge Orders     None         Rondel Baton, MD 08/21/23 1954

## 2023-08-21 NOTE — ED Triage Notes (Signed)
Patient reports she has been dizzy and staggering when walking for over a week. Patient went to UC on 10/29 and was told to come to ER.  Patient reports she wasn't able to come until now.  Patient having difficulty answering questions.  Complains of a dull ache in the posterior region of head.

## 2023-08-21 NOTE — ED Notes (Signed)
This RN spoke with daughter Rene Kocher about patient's discharge. Daughter to pick up patient from lobby due to patient's inability to drive related to dizziness. This RN educated both patient and daughter about use of walker rollaider or cane for unsteadiness. Both daughter and patient verbalize understanding. Patient transported to lobby for pickup, sort NT aware to assist patient to ambulation.

## 2023-08-21 NOTE — ED Notes (Signed)
Patient transported to MRI 

## 2023-08-25 ENCOUNTER — Other Ambulatory Visit: Payer: Self-pay | Admitting: Nurse Practitioner

## 2023-08-25 DIAGNOSIS — E042 Nontoxic multinodular goiter: Secondary | ICD-10-CM | POA: Diagnosis not present

## 2023-08-25 DIAGNOSIS — E039 Hypothyroidism, unspecified: Secondary | ICD-10-CM

## 2023-08-27 NOTE — Progress Notes (Signed)
Ethanol ordered to assess for alcohol as a cause of her dizziness

## 2023-08-29 DIAGNOSIS — H04123 Dry eye syndrome of bilateral lacrimal glands: Secondary | ICD-10-CM | POA: Diagnosis not present

## 2023-08-30 ENCOUNTER — Ambulatory Visit
Admission: RE | Admit: 2023-08-30 | Discharge: 2023-08-30 | Disposition: A | Discharge status: Home / Self Care | Payer: Medicare HMO | Source: Ambulatory Visit | Attending: Nurse Practitioner | Admitting: Nurse Practitioner

## 2023-08-30 DIAGNOSIS — E042 Nontoxic multinodular goiter: Secondary | ICD-10-CM

## 2023-08-30 DIAGNOSIS — E039 Hypothyroidism, unspecified: Secondary | ICD-10-CM

## 2023-09-07 ENCOUNTER — Telehealth: Payer: Self-pay

## 2023-09-07 NOTE — Telephone Encounter (Signed)
Transition Care Management Follow-up Telephone Call Date of discharge and from where: 08/21/2023 [facility] How have you been since you were released from the hospital? Patient stated she is still experiencing occasional dizziness. Any questions or concerns? No  Items Reviewed: Did the pt receive and understand the discharge instructions provided? Yes  Medications obtained and verified? Yes  Other? No  Any new allergies since your discharge? No  Dietary orders reviewed? Yes Do you have support at home? Yes   Follow up appointments reviewed:  PCP Hospital f/u appt confirmed?  Patient stated she will call and follow up with PCP.  Scheduled to see  on  @ . Specialist Hospital f/u appt confirmed? Yes  Scheduled to see Meagan R. Younts, NP on 08/25/2023 @ [employer]. Are transportation arrangements needed? No  If their condition worsens, is the pt aware to call PCP or go to the Emergency Dept.? Yes Was the patient provided with contact information for the PCP's office or ED? Yes Was to pt encouraged to call back with questions or concerns? Yes   Margaret Torres [facility]  [facility], [facility] Guide Direct Dial: [phone]  Website: [email or web address]

## 2023-09-13 DIAGNOSIS — H02882 Meibomian gland dysfunction right lower eyelid: Secondary | ICD-10-CM | POA: Diagnosis not present

## 2023-09-22 DIAGNOSIS — R200 Anesthesia of skin: Secondary | ICD-10-CM | POA: Diagnosis not present

## 2023-09-22 DIAGNOSIS — E039 Hypothyroidism, unspecified: Secondary | ICD-10-CM | POA: Diagnosis not present

## 2023-10-18 DIAGNOSIS — E039 Hypothyroidism, unspecified: Secondary | ICD-10-CM | POA: Diagnosis not present

## 2023-10-18 DIAGNOSIS — R3589 Other polyuria: Secondary | ICD-10-CM | POA: Diagnosis not present

## 2023-10-27 DIAGNOSIS — R634 Abnormal weight loss: Secondary | ICD-10-CM | POA: Diagnosis not present

## 2023-10-27 DIAGNOSIS — Z8619 Personal history of other infectious and parasitic diseases: Secondary | ICD-10-CM | POA: Diagnosis not present

## 2023-10-27 DIAGNOSIS — D509 Iron deficiency anemia, unspecified: Secondary | ICD-10-CM | POA: Diagnosis not present

## 2023-11-14 DIAGNOSIS — E039 Hypothyroidism, unspecified: Secondary | ICD-10-CM | POA: Diagnosis not present

## 2023-11-29 DIAGNOSIS — H16143 Punctate keratitis, bilateral: Secondary | ICD-10-CM | POA: Diagnosis not present

## 2023-12-06 DIAGNOSIS — H02882 Meibomian gland dysfunction right lower eyelid: Secondary | ICD-10-CM | POA: Diagnosis not present

## 2023-12-26 DIAGNOSIS — E039 Hypothyroidism, unspecified: Secondary | ICD-10-CM | POA: Diagnosis not present

## 2023-12-27 DIAGNOSIS — F411 Generalized anxiety disorder: Secondary | ICD-10-CM | POA: Diagnosis not present

## 2023-12-27 DIAGNOSIS — I10 Essential (primary) hypertension: Secondary | ICD-10-CM | POA: Diagnosis not present

## 2023-12-27 DIAGNOSIS — E039 Hypothyroidism, unspecified: Secondary | ICD-10-CM | POA: Diagnosis not present

## 2023-12-27 DIAGNOSIS — I679 Cerebrovascular disease, unspecified: Secondary | ICD-10-CM | POA: Diagnosis not present

## 2024-01-09 ENCOUNTER — Other Ambulatory Visit: Payer: Self-pay

## 2024-01-09 ENCOUNTER — Emergency Department (HOSPITAL_COMMUNITY)
Admission: EM | Admit: 2024-01-09 | Discharge: 2024-01-09 | Disposition: A | Discharge status: Home / Self Care | Attending: Emergency Medicine | Admitting: Emergency Medicine

## 2024-01-09 ENCOUNTER — Emergency Department (HOSPITAL_COMMUNITY)

## 2024-01-09 ENCOUNTER — Encounter (HOSPITAL_COMMUNITY): Payer: Self-pay | Admitting: *Deleted

## 2024-01-09 DIAGNOSIS — M1711 Unilateral primary osteoarthritis, right knee: Secondary | ICD-10-CM | POA: Diagnosis not present

## 2024-01-09 DIAGNOSIS — Z0389 Encounter for observation for other suspected diseases and conditions ruled out: Secondary | ICD-10-CM | POA: Diagnosis not present

## 2024-01-09 DIAGNOSIS — M25562 Pain in left knee: Secondary | ICD-10-CM | POA: Insufficient documentation

## 2024-01-09 DIAGNOSIS — M7652 Patellar tendinitis, left knee: Secondary | ICD-10-CM | POA: Diagnosis not present

## 2024-01-09 DIAGNOSIS — I10 Essential (primary) hypertension: Secondary | ICD-10-CM | POA: Insufficient documentation

## 2024-01-09 DIAGNOSIS — Z7982 Long term (current) use of aspirin: Secondary | ICD-10-CM | POA: Diagnosis not present

## 2024-01-09 DIAGNOSIS — Z7902 Long term (current) use of antithrombotics/antiplatelets: Secondary | ICD-10-CM | POA: Insufficient documentation

## 2024-01-09 DIAGNOSIS — Z79899 Other long term (current) drug therapy: Secondary | ICD-10-CM | POA: Insufficient documentation

## 2024-01-09 DIAGNOSIS — M25462 Effusion, left knee: Secondary | ICD-10-CM | POA: Diagnosis not present

## 2024-01-09 MED ORDER — ACETAMINOPHEN 500 MG PO TABS: 1000.0000 mg | ORAL_TABLET | Freq: Once | ORAL | Status: DC

## 2024-01-09 MED ORDER — DICLOFENAC SODIUM 1 % EX GEL
2.0000 g | Freq: Once | CUTANEOUS | Status: AC
  Administered 2024-01-09: 2 g via TOPICAL
  Filled 2024-01-09: qty 100

## 2024-01-09 MED ORDER — DICLOFENAC SODIUM 3 % EX GEL: 4.0000 g | Freq: Three times a day (TID) | CUTANEOUS | 1 refills | Status: AC | PRN

## 2024-01-09 NOTE — ED Notes (Signed)
 Pt ambulated with cane to the bathroom with this RN as standby assist. Gait primarily slow and steady with pt pausing twice due to "slipped" sensation of left knee. Pt reports improvement in pain. Able to get in and out of bed unassisted.

## 2024-01-09 NOTE — Progress Notes (Signed)
 Orthopedic Tech Progress Note Patient Details:  Margaret Torres 01/17/1946 [id number]  Ortho Devices Type of Ortho Device: Knee Sleeve Ortho Device/Splint Location: lle Ortho Device/Splint Interventions: Ordered, Application, Adjustment   Post Interventions Patient Tolerated: Well Instructions Provided: Adjustment of device, Care of device  [name] Post 01/09/2024, 4:36 AM

## 2024-01-09 NOTE — ED Notes (Signed)
 ED Provider at bedside.

## 2024-01-09 NOTE — ED Triage Notes (Signed)
 The pt has had pain in her lt knee for 2-3 days and the knee feels like it  dislocates no known injury  she has arthritis

## 2024-01-09 NOTE — ED Provider Notes (Signed)
 [place] [facility]ERGENCY DEPARTMENT AT [facility] Provider Note  CSN: [id number] Arrival date & time: 01/09/24 0247  Chief Complaint(s) Knee Pain  HPI Margaret Torres is a 78 y.o. female with a past medical history listed below who presents to the emergency department with left knee pain for the past several days with worsening swelling, pain with inability to weight-bear.  She denies any falls or trauma causing injury to the knee.  Reports that her pain began as normal arthritis pain and then after sitting at church event for a couple hours, her pain worsened as she attempted to stand up.  She reports that it feels like her joint is moving out of place.  She has not tried taking any pain medicine stating that she cannot tolerate Tylenol and was told not to take NSAIDs.  She has tried using a knee sleeve and walking with a cane but the pain is too severe prompting her visit today.   The history is provided by the patient.    Past Medical History Past Medical History:  Diagnosis Date   Allergic rhinitis 07/16/2007   Carbon monoxide exposure    Dizziness    Dysphagia 03/02/2016   Essential hypertension 07/16/2007   First degree hemorrhoids 11/28/2020   Generalized abdominal pain 11/28/2020   GERD (gastroesophageal reflux disease) 10/12/2018   Hemorrhage of rectum and anus 11/28/2020   History of cervical cancer    HTN (hypertension)    Hyperlipidemia 06/26/2007   Hyperthyroidism 04/21/2016   hx of thyroid nodule   Insomnia 06/20/2014   Left anterior knee pain 09/30/2014   Leg pain, bilateral 03/02/2016   Major depressive disorder 07/16/2007   Mild cognitive impairment with memory loss 05/14/2022   Multiple thyroid nodules 12/30/2017   Olecranon bursitis of left elbow 01/30/2016   Primary osteoarthritis of both knees 10/20/2018   RBBB    Second degree hemorrhoids 11/28/2020   Vitamin D deficiency 03/19/2022   Wheezing 11/17/2020   Patient Active Problem List    Diagnosis Date Noted   Left MCA territory branch infarct 09/28/2022   Formication 09/24/2022   Generalized anxiety disorder 09/24/2022   RBBB 06/23/2022   Abnormal EKG 06/23/2022   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   Irregular heart beat 06/05/2022   Carbon monoxide exposure    Mild cognitive impairment with memory loss 05/14/2022   Vitamin D deficiency 03/19/2022   Abnormal feces 11/28/2020   First degree hemorrhoids 11/28/2020   Generalized abdominal pain 11/28/2020   Second degree hemorrhoids 11/28/2020   Hemorrhage of rectum and anus 11/28/2020   Wheezing 11/17/2020   Primary osteoarthritis of both knees 10/20/2018   GERD (gastroesophageal reflux disease) 10/12/2018   Multiple thyroid nodules 12/30/2017   Diarrhea 09/20/2016   Hyperthyroidism 04/21/2016   Bowel incontinence 04/21/2016   Leg pain, bilateral 03/02/2016   Dysphagia 03/02/2016   Olecranon bursitis of left elbow 01/30/2016   Physical exam 01/27/2015   Bilateral renal cysts 01/27/2015   Left anterior knee pain 09/30/2014   Insomnia 06/20/2014   Hemorrhoids, external 03/27/2014   Major depressive disorder 07/16/2007   Essential hypertension 07/16/2007   Allergic rhinitis 07/16/2007   Hyperlipidemia 06/26/2007   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 Authorizing Provider  Diclofenac Sodium 3 % GEL Apply 4 g topically 3 (three) times daily as needed. 01/09/24  Yes Kasidee Voisin, Amadeo Garnet, MD  amLODipine [facility])  5 MG tablet Take 1 tablet (5 mg total) by mouth daily as needed (SBP>140). 08/06/22   Sharlene Dory, NP  ammonium lactate (LAC-HYDRIN) 12 % lotion Apply topically 2 (two) times daily. 10/13/22   [provider]  aspirin EC 81 MG tablet Take 1 tablet (81 mg total) by mouth daily. Swallow whole. 10/01/22   Elmer Picker, NP  atorvastatin (LIPITOR) 10 MG tablet Take 1  tablet (10 mg total) by mouth daily. Patient not taking: Reported on 10/22/2022 10/01/22   Elmer Picker, NP  Cholecalciferol (VITAMIN D3) 10 MCG (400 UNIT) CAPS Take by mouth.    [provider]  CLENPIQ 10-3.5-12 MG-GM -GM/175ML SOLN Take by mouth. Patient not taking: Reported on 10/22/2022 06/22/22   [provider]  clonazePAM (KLONOPIN) 0.5 MG tablet Take 1 tablet (0.5 mg total) by mouth 2 (two) times daily as needed. 09/28/22   Sheliah Hatch, MD  clopidogrel (PLAVIX) 75 MG tablet Take 1 tablet (75 mg total) by mouth daily. 10/01/22   Elmer Picker, NP  fluticasone Aleda Grana) 50 MCG/ACT nasal spray Use 2 spray(s) in each nostril once daily 08/13/22   Sheliah Hatch, MD  hydrOXYzine (ATARAX) 25 MG tablet Take 25 mg by mouth at bedtime. 10/13/22   [provider]  loratadine (CLARITIN) 10 MG tablet Take 1 tablet (10 mg total) by mouth daily as needed for allergies. Take 10 mg by mouth daily as needed for allergies. 10/26/21   Sheliah Hatch, MD  meclizine (ANTIVERT) 25 MG tablet TAKE 1 TABLET BY MOUTH THREE TIMES DAILY AS NEEDED FOR DIZZINESS 09/20/22   Sheliah Hatch, MD  olopatadine (PATADAY) 0.1 % ophthalmic solution 1 drop 2 (two) times daily.    [provider]  omeprazole (PRILOSEC) 20 MG capsule Take 20 mg by mouth 2 (two) times daily. Patient not taking: Reported on 10/22/2022 07/10/22   [provider]  permethrin (ELIMITE) 5 % cream Apply to affected area once 09/20/22   Sheliah Hatch, MD  polyethylene glycol powder (GLYCOLAX/MIRALAX) 17 GM/SCOOP powder Take 17 g by mouth 2 (two) times daily as needed. 05/26/22   Gustavus Bryant, FNP  sucralfate (CARAFATE) 1 g tablet Take 1 tablet (1 g total) by mouth 3 (three) times daily with meals. 03/19/22   Sheliah Hatch, MD  triamcinolone cream (KENALOG) 0.1 % Apply 1 Application topically 2 (two) times daily. 08/26/22   Tomi Bamberger, PA-C                                                                                                                                     Allergies Pneumococcal vaccine and Influenza vaccines  Review of Systems Review of Systems As noted in HPI  Physical Exam Vital Signs  I have reviewed the triage vital signs BP (!) 165/95 (BP Location: Left Arm)   Pulse 81   Temp 97.8 F (36.6 C) (Oral)  Resp 16   Ht 5\' 4"  (1.626 m)   Wt 63.5 kg   LMP 06/30/2013   SpO2 100%   BMI 24.03 kg/m   Physical Exam Vitals reviewed.  Constitutional:      General: She is not in acute distress.    Appearance: She is well-developed. She is not diaphoretic.  HENT:     Head: Normocephalic and atraumatic.     Right Ear: External ear normal.     Left Ear: External ear normal.     Nose: Nose normal.  Eyes:     General: No scleral icterus.    Conjunctiva/sclera: Conjunctivae normal.  Neck:     Trachea: Phonation normal.  Cardiovascular:     Rate and Rhythm: Normal rate and regular rhythm.  Pulmonary:     Effort: Pulmonary effort is normal. No respiratory distress.     Breath sounds: No stridor.  Abdominal:     General: There is no distension.  Musculoskeletal:     Cervical back: Normal range of motion.     Right knee: Normal pulse.     Left knee: Swelling and effusion present. No deformity, erythema, ecchymosis, lacerations, bony tenderness or crepitus. Decreased range of motion. Tenderness present over the medial joint line. No patellar tendon tenderness. No LCL laxity, MCL laxity, ACL laxity or PCL laxity.Normal pulse.     Left foot: No swelling.  Feet:     Left foot:     Skin integrity: No ulcer, blister, skin breakdown, erythema, warmth or fissure.  Neurological:     Mental Status: She is alert and oriented to person, place, and time.  Psychiatric:        Behavior: Behavior normal.     ED Results and Treatments Labs (all labs ordered are listed, but only abnormal results are displayed) Labs Reviewed - No data to display                                                                                                                        EKG  EKG Interpretation Date/Time:    Ventricular Rate:    PR Interval:    QRS Duration:    QT Interval:    QTC Calculation:   R Axis:      Text Interpretation:         Radiology DG Knee Complete 4 Views Left Result Date: 01/09/2024 CLINICAL DATA:  Left knee feels like it is dislocating whenever standing. Knee pain. EXAM: LEFT KNEE - COMPLETE 4+ VIEW COMPARISON:  10/20/2018 FINDINGS: No acute fracture or dislocation. Hypertrophic degenerative arthritis in the right knee is similar to 10/20/2018. There is mild to moderate medial compartment narrowing. Increased size of the large calcified loose body in the suprapatellar bursa. Superior patellar enthesophytes. IMPRESSION: Hypertrophic degenerative arthritis in the right knee is similar to 10/20/2018. Increased size of the large calcified loose body in the suprapatellar bursa. Electronically Signed   By: Minerva Fester M.D.   On: 01/09/2024 03:40  Medications Ordered in ED Medications  diclofenac Sodium (VOLTAREN) 1 % topical gel 2 g (2 g Topical Given 01/09/24 0412)   Procedures Procedures  (including critical care time) Medical Decision Making / ED Course   Medical Decision Making   Atraumatic left knee pain with swelling X-ray without evidence of fracture or dislocation.  She does have stable degenerative arthritis.  Notable increased calcified loose body in the suprapatellar bursa may be contributing to her presentation.  Given the history of the pain worsening while attempting to stand, also considering meniscal tear.  Exam is not concerning for septic or inflammatory arthritis.  Voltaren cream applied and hinged brace provided. Able to ambulate better. Ortho/PCP follow recommended.     Final Clinical Impression(s) / ED Diagnoses Final diagnoses:  Left medial knee pain  Effusion of left knee   The patient  appears reasonably screened and/or stabilized for discharge and I doubt any other medical condition or other [facility] requiring further screening, evaluation, or treatment in the ED at this time. I have discussed the findings, Dx and Tx plan with the patient/family who expressed understanding and agree(s) with the plan. Discharge instructions discussed at length. The patient/family was given strict return precautions who verbalized understanding of the instructions. No further questions at time of discharge.  Disposition: Discharge  Condition: Good  ED Discharge Orders          Ordered    Diclofenac Sodium 3 % GEL  3 times daily PRN        01/09/24 0526    Walker standard        01/09/24 9629             Follow Up: Merri Brunette, MD [address] [place] [address] [phone]  Call  to schedule an appointment for close follow up  Myrene Galas, MD [address] [place] [address] [phone]  Call  to schedule an appointment for close follow up    This chart was dictated using voice recognition software.  Despite best efforts to proofread,  errors can occur which can change the documentation meaning.    Nira Conn, MD 01/09/24 [phone]

## 2024-01-12 DIAGNOSIS — E039 Hypothyroidism, unspecified: Secondary | ICD-10-CM | POA: Diagnosis not present

## 2024-02-02 DIAGNOSIS — M25562 Pain in left knee: Secondary | ICD-10-CM | POA: Diagnosis not present

## 2024-02-06 DIAGNOSIS — M1712 Unilateral primary osteoarthritis, left knee: Secondary | ICD-10-CM | POA: Diagnosis not present

## 2024-03-19 DIAGNOSIS — E039 Hypothyroidism, unspecified: Secondary | ICD-10-CM | POA: Diagnosis not present

## 2024-03-23 DIAGNOSIS — I10 Essential (primary) hypertension: Secondary | ICD-10-CM | POA: Diagnosis not present

## 2024-03-26 DIAGNOSIS — I10 Essential (primary) hypertension: Secondary | ICD-10-CM | POA: Diagnosis not present

## 2024-03-26 NOTE — Progress Notes (Signed)
 Patient came to mobile screening at [facility]. BP slightly elevated 135/91<139/91. Glucose non fasting 93 with A1C 5.3. Pt indicated a SDOH need for food. Community resources given.

## 2024-04-04 DIAGNOSIS — H2513 Age-related nuclear cataract, bilateral: Secondary | ICD-10-CM | POA: Diagnosis not present

## 2024-04-04 LAB — AMB RESULTS CONSOLE CBG: Glucose: 116

## 2024-04-04 LAB — HEMOGLOBIN A1C: Hemoglobin A1C: 5.3

## 2024-04-04 NOTE — Progress Notes (Signed)
 Utility SDOH resource given.

## 2024-04-09 DIAGNOSIS — E7800 Pure hypercholesterolemia, unspecified: Secondary | ICD-10-CM | POA: Diagnosis not present

## 2024-04-09 DIAGNOSIS — F411 Generalized anxiety disorder: Secondary | ICD-10-CM | POA: Diagnosis not present

## 2024-04-09 DIAGNOSIS — E039 Hypothyroidism, unspecified: Secondary | ICD-10-CM | POA: Diagnosis not present

## 2024-04-09 DIAGNOSIS — I10 Essential (primary) hypertension: Secondary | ICD-10-CM | POA: Diagnosis not present

## 2024-04-09 DIAGNOSIS — F321 Major depressive disorder, single episode, moderate: Secondary | ICD-10-CM | POA: Diagnosis not present

## 2024-04-12 ENCOUNTER — Encounter: Payer: Self-pay | Admitting: Physician Assistant

## 2024-04-12 ENCOUNTER — Ambulatory Visit: Admitting: Physician Assistant

## 2024-04-12 VITALS — BP 118/78 | HR 62 | Resp 18 | Wt 146.0 lb

## 2024-04-12 DIAGNOSIS — R42 Dizziness and giddiness: Secondary | ICD-10-CM

## 2024-04-12 NOTE — Patient Instructions (Addendum)
   MRI brain Recommend eye doctor for cataracts

## 2024-04-12 NOTE — Progress Notes (Signed)
 NEUROLOGY FOLLOW UP OFFICE NOTE  Margaret Torres [id number]  Assessment/Plan:     78 year old woman with a history of stroke, on aspirin  and Plavix , hypertension, hyperlipidemia, hypothyroidism (most recent TSH 14.72 in 03/2024), occipital headaches, right bundle branch block, presenting for evaluation of vague symptoms of staggering without dizziness or vertigo. On November 2024, the patient presented to the ED with similar symptoms, feeling that she was drunk , with a light headed sensation, worse when she was trying to walk.  She had extensive workup, without acute findings.  No vertigo, stroke, orthostatics, NPH or anemia were found.  No neurological findings were present on her exam at the time.  She was discharged on meclizine . Today's visit, the patient reports similar complaints, feeling off balance.  Does not appear to be compliant to her medications (synthroid).  No OH was noted on exam today, her BP was normal. Etiology unknown. Given her prior L MCA, we will proceed with an MRI of the brain.  Pending on the results, may warrant further workup.    Recommendations MRI of the brain for evaluation of any structural abnormalities and vascular load given her prior history of stroke Recommend compliance with her medications, especially with thyroid , antiglycemics BP, continue aspirin  and Plavix  for stroke prevention. Follow-up pending on the results of the MRI of the brain  Subjective:     Chronology Abrupt or gradual onset?  Gradual Vertigo?  I had it  for 20 years,but this is different  Denies nausea,vomiting and abnormal eye motion; No change with head motion.& gait unsteadiness Presyncope? no Disequilibrium (unsteadiness when walking)?  Constantly staggering Non-specific dizziness? Occasionally  Any periods without symptoms?  In the evening around 5-7pm,when I am doing things.   Accompanying symptoms, including: Difficulty with vision ?  No Difficulty with hearing?   No Paresthesia (tingling/numbness) in feet/legs?  Endorsed intermittently , in the afternoon, this is chronic Weakness or incoordination of legs?  No History/frequency of falls? Denies  Provoking symptoms such as change in posture? Lightheadedness am off balance keep on feeling like falling  Rolling over in bed? Denies  Bending over/straightening up?  Denies Cough/sneeze/straining? Denies  Medication use, especially any new medications or recent change in medication dosing?  2 weeks ago her Synthroid dose was changed in view of very elevated thyroid  levels Substance use?  No Stress/emotional issues?  I am always stressed  Alcohol use?  Denies Medication use, adherence?  She admits to missing doses Other supplements/over-the-counter?  Denies Diabetes? NO  Hypertension? Yes Coronary artery disease? NO Peripheral vascular disease? NO History of stroke?  Yes, embolic L MCA Parkinson's disease?  No Multiple sclerosis?  No B12 deficiency?  No Anxiety/panic disorder? Anxiety, takes clonazepam  as needed  History of trauma?  No   Review of systems Constitutional: Denies nausea, vomiting, sweating Ears, Nose, Throat: Denies ear discharge, hearing loss, tinnitus, symptoms of recent viral upper respiratory infection  Cardiovascular: Denies chest pain, palpitations Gastrointestinal: Denies diarrhea Musculoskeletal: Denies muscle weakness, muscle spasm, joint pain, arthritis, frequent falls Neurological: double vision, chronic numbness of the feet, denies focal weakness, difficulty speaking, difficulty swallowing, imbalance, as before she has a history of ocular headaches Psychiatric: depression, anxiety, emotional stress     Labs: A1c 5.3  Last MRI of the brain, personally reviewed, 08/21/2023: Without acute intracranial abnormalities, chronic ischemic disease in the cerebellum and left MCA territory, mild chronic microhemorrhages in the brain, chronic white matter disease   PAST  MEDICAL HISTORY: Past Medical History:  Diagnosis Date  Allergic rhinitis 07/16/2007   Carbon monoxide exposure    Dizziness    Dysphagia 03/02/2016   Essential hypertension 07/16/2007   First degree hemorrhoids 11/28/2020   Generalized abdominal pain 11/28/2020   GERD (gastroesophageal reflux disease) 10/12/2018   Hemorrhage of rectum and anus 11/28/2020   History of cervical cancer    HTN (hypertension)    Hyperlipidemia 06/26/2007   Hyperthyroidism 04/21/2016   hx of thyroid  nodule   Insomnia 06/20/2014   Left anterior knee pain 09/30/2014   Leg pain, bilateral 03/02/2016   Major depressive disorder 07/16/2007   Mild cognitive impairment with memory loss 05/14/2022   Multiple thyroid  nodules 12/30/2017   Olecranon bursitis of left elbow 01/30/2016   Primary osteoarthritis of both knees 10/20/2018   RBBB    Second degree hemorrhoids 11/28/2020   Vitamin D  deficiency 03/19/2022   Wheezing 11/17/2020    MEDICATIONS: Current Outpatient Medications on File Prior to Visit  Medication Sig Dispense Refill   amLODipine  (NORVASC ) 5 MG tablet Take 1 tablet (5 mg total) by mouth daily as needed (SBP>140). 90 tablet 1   ammonium lactate (LAC-HYDRIN) 12 % lotion Apply topically 2 (two) times daily.     aspirin  EC 81 MG tablet Take 1 tablet (81 mg total) by mouth daily. Swallow whole. 30 tablet 12   atorvastatin  (LIPITOR) 10 MG tablet Take 1 tablet (10 mg total) by mouth daily. 30 tablet 1   Cholecalciferol (VITAMIN D3) 10 MCG (400 UNIT) CAPS Take by mouth.     CLENPIQ 10-3.5-12 MG-GM -GM/175ML SOLN Take by mouth.     clonazePAM  (KLONOPIN ) 0.5 MG tablet Take 1 tablet (0.5 mg total) by mouth 2 (two) times daily as needed. 60 tablet 1   clopidogrel  (PLAVIX ) 75 MG tablet Take 1 tablet (75 mg total) by mouth daily. 21 tablet 0   Diclofenac  Sodium 3 % GEL Apply 4 g topically 3 (three) times daily as needed. 100 g 1   fluticasone  (FLONASE ) 50 MCG/ACT nasal spray Use 2 spray(s) in each  nostril once daily 16 g 0   hydrOXYzine  (ATARAX ) 25 MG tablet Take 25 mg by mouth at bedtime.     loratadine  (CLARITIN ) 10 MG tablet Take 1 tablet (10 mg total) by mouth daily as needed for allergies. Take 10 mg by mouth daily as needed for allergies. 30 tablet 2   meclizine  (ANTIVERT ) 25 MG tablet TAKE 1 TABLET BY MOUTH THREE TIMES DAILY AS NEEDED FOR DIZZINESS 30 tablet 0   olopatadine (PATADAY) 0.1 % ophthalmic solution 1 drop 2 (two) times daily.     omeprazole  (PRILOSEC) 20 MG capsule Take 20 mg by mouth 2 (two) times daily.     permethrin  (ELIMITE ) 5 % cream Apply to affected area once 60 g 0   polyethylene glycol powder (GLYCOLAX /MIRALAX ) 17 GM/SCOOP powder Take 17 g by mouth 2 (two) times daily as needed. 3350 g 0   sucralfate  (CARAFATE ) 1 g tablet Take 1 tablet (1 g total) by mouth 3 (three) times daily with meals. 90 tablet 0   triamcinolone  cream (KENALOG ) 0.1 % Apply 1 Application topically 2 (two) times daily. 30 g 0   No current facility-administered medications on file prior to visit.    ALLERGIES: Allergies  Allergen Reactions   Pneumococcal Vaccine Shortness Of Breath   Influenza Vaccines     Rash, tongue swelling    FAMILY HISTORY: Family History  Problem Relation Age of Onset   Cervical cancer Mother    Hypertension Mother  Hypertension Father    Diabetes Sister    Stroke Sister    Cancer Sister        bone cancer   Breast cancer Sister    Heart disease Brother    Diabetes Brother    Stroke Maternal Grandmother    Heart attack Son    Cancer Other    Autism spectrum disorder Other    Thyroid  disease Neg Hx    .   Objective:    General: No acute distress.  Patient appears well groomed.   Head:  Normocephalic/atraumatic Eyes:  Fundi examined but not visualized Neck: supple, no paraspinal tenderness, full range of motion Heart:  Regular rate and rhythm Lungs:  Clear to auscultation bilaterally Back: No paraspinal tenderness Neurological Exam: alert  and oriented to person, place, and time. Attention span and concentration intact, recent and remote memory intact, fund of knowledge intact.  Speech fluent and not dysarthric, language intact.  CN II-XII intact. Bulk and tone normal, muscle strength 5/5 throughout.  Sensation to light touch, temperature and vibration intact.  Deep tendon reflexes 2+ throughout, toes downgoing.  Finger to nose and heel to shin testing intact.  Gait normal, Romberg negative.     CC: Claudene Pellet, MD    Thanks spends 33 minutes

## 2024-04-16 ENCOUNTER — Encounter: Payer: Self-pay | Admitting: Physician Assistant

## 2024-04-16 ENCOUNTER — Other Ambulatory Visit: Payer: Self-pay | Admitting: Physician Assistant

## 2024-04-16 ENCOUNTER — Telehealth: Payer: Self-pay | Admitting: Physician Assistant

## 2024-04-16 DIAGNOSIS — H2513 Age-related nuclear cataract, bilateral: Secondary | ICD-10-CM | POA: Diagnosis not present

## 2024-04-16 MED ORDER — DIAZEPAM 2 MG PO TABS
ORAL_TABLET | ORAL | 0 refills | Status: DC
Start: 2024-04-16 — End: 2024-08-26

## 2024-04-16 NOTE — Telephone Encounter (Signed)
 Pt is sch for 05-02-24 for the MRI she just called back to let us   know it was not today

## 2024-04-16 NOTE — Telephone Encounter (Signed)
 Pt called no answer left a voice mail to call the office back when she calls back that Camie called in valium  Take 1 tab 30 minutes prior to MRI, may add an additional tab if needed

## 2024-04-16 NOTE — Telephone Encounter (Signed)
 Pt is sch for MRI today at 1:00 and needs something to help her relax during the MRI  please call into [facility] on [address]

## 2024-04-21 DIAGNOSIS — I10 Essential (primary) hypertension: Secondary | ICD-10-CM | POA: Diagnosis not present

## 2024-05-01 NOTE — Telephone Encounter (Signed)
 Pt called in wanting to make sure her Valium  for her MRI tomorrow will not interfere with her other medications? I let her know to double check with her pharmacist also since her MRI appointment is so close.

## 2024-05-01 NOTE — Telephone Encounter (Signed)
 Patient advised and will call [facility] and thanked me for calling.

## 2024-05-02 ENCOUNTER — Ambulatory Visit
Admission: RE | Admit: 2024-05-02 | Discharge: 2024-05-02 | Disposition: A | Discharge status: Home / Self Care | Source: Ambulatory Visit | Attending: Physician Assistant | Admitting: Physician Assistant

## 2024-05-02 DIAGNOSIS — Z8673 Personal history of transient ischemic attack (TIA), and cerebral infarction without residual deficits: Secondary | ICD-10-CM | POA: Diagnosis not present

## 2024-05-02 DIAGNOSIS — R42 Dizziness and giddiness: Secondary | ICD-10-CM | POA: Diagnosis not present

## 2024-05-09 ENCOUNTER — Ambulatory Visit: Payer: Self-pay | Admitting: Physician Assistant

## 2024-05-09 ENCOUNTER — Telehealth: Payer: Self-pay | Admitting: Physician Assistant

## 2024-05-09 NOTE — Telephone Encounter (Signed)
 Pt wants to get the results of the MRI that was done on the 23 rd

## 2024-05-09 NOTE — Telephone Encounter (Signed)
 Advised of Mri is pending, will call patient once resulted out by provider.

## 2024-05-10 DIAGNOSIS — E7800 Pure hypercholesterolemia, unspecified: Secondary | ICD-10-CM | POA: Diagnosis not present

## 2024-05-10 DIAGNOSIS — E039 Hypothyroidism, unspecified: Secondary | ICD-10-CM | POA: Diagnosis not present

## 2024-05-10 DIAGNOSIS — F411 Generalized anxiety disorder: Secondary | ICD-10-CM | POA: Diagnosis not present

## 2024-05-10 DIAGNOSIS — F321 Major depressive disorder, single episode, moderate: Secondary | ICD-10-CM | POA: Diagnosis not present

## 2024-05-10 DIAGNOSIS — I10 Essential (primary) hypertension: Secondary | ICD-10-CM | POA: Diagnosis not present

## 2024-05-14 ENCOUNTER — Other Ambulatory Visit: Payer: Self-pay | Admitting: Nurse Practitioner

## 2024-05-14 DIAGNOSIS — E039 Hypothyroidism, unspecified: Secondary | ICD-10-CM | POA: Diagnosis not present

## 2024-05-14 DIAGNOSIS — R1310 Dysphagia, unspecified: Secondary | ICD-10-CM

## 2024-05-14 DIAGNOSIS — R42 Dizziness and giddiness: Secondary | ICD-10-CM | POA: Diagnosis not present

## 2024-05-21 DIAGNOSIS — I10 Essential (primary) hypertension: Secondary | ICD-10-CM | POA: Diagnosis not present

## 2024-05-22 DIAGNOSIS — S40861A Insect bite (nonvenomous) of right upper arm, initial encounter: Secondary | ICD-10-CM | POA: Diagnosis not present

## 2024-05-22 DIAGNOSIS — W57XXXA Bitten or stung by nonvenomous insect and other nonvenomous arthropods, initial encounter: Secondary | ICD-10-CM | POA: Diagnosis not present

## 2024-05-22 DIAGNOSIS — S20469A Insect bite (nonvenomous) of unspecified back wall of thorax, initial encounter: Secondary | ICD-10-CM | POA: Diagnosis not present

## 2024-05-22 DIAGNOSIS — R350 Frequency of micturition: Secondary | ICD-10-CM | POA: Diagnosis not present

## 2024-05-22 DIAGNOSIS — S40862A Insect bite (nonvenomous) of left upper arm, initial encounter: Secondary | ICD-10-CM | POA: Diagnosis not present

## 2024-06-06 LAB — AMB RESULTS CONSOLE CBG: Glucose: 91

## 2024-06-10 DIAGNOSIS — F411 Generalized anxiety disorder: Secondary | ICD-10-CM | POA: Diagnosis not present

## 2024-06-10 DIAGNOSIS — E7800 Pure hypercholesterolemia, unspecified: Secondary | ICD-10-CM | POA: Diagnosis not present

## 2024-06-10 DIAGNOSIS — E039 Hypothyroidism, unspecified: Secondary | ICD-10-CM | POA: Diagnosis not present

## 2024-06-10 DIAGNOSIS — F321 Major depressive disorder, single episode, moderate: Secondary | ICD-10-CM | POA: Diagnosis not present

## 2024-06-10 DIAGNOSIS — I10 Essential (primary) hypertension: Secondary | ICD-10-CM | POA: Diagnosis not present

## 2024-06-12 DIAGNOSIS — H2513 Age-related nuclear cataract, bilateral: Secondary | ICD-10-CM | POA: Diagnosis not present

## 2024-06-13 DIAGNOSIS — Z0100 Encounter for examination of eyes and vision without abnormal findings: Secondary | ICD-10-CM | POA: Diagnosis not present

## 2024-06-14 ENCOUNTER — Ambulatory Visit
Admission: RE | Admit: 2024-06-14 | Discharge: 2024-06-14 | Disposition: A | Discharge status: Home / Self Care | Source: Ambulatory Visit | Attending: Nurse Practitioner | Admitting: Nurse Practitioner

## 2024-06-14 DIAGNOSIS — R1310 Dysphagia, unspecified: Secondary | ICD-10-CM | POA: Diagnosis not present

## 2024-06-20 DIAGNOSIS — I10 Essential (primary) hypertension: Secondary | ICD-10-CM | POA: Diagnosis not present

## 2024-06-20 LAB — AMB RESULTS CONSOLE CBG: Glucose: 91

## 2024-07-03 LAB — AMB RESULTS CONSOLE CBG: Glucose: 122

## 2024-07-04 NOTE — Progress Notes (Signed)
 Pt has PCP. No SDOH needs at this time. Education surrounding blood pressure completed and pt states she has appointment coming up with Endocrinologist about thyroid  levels and wonders if her blood pressure could be related to that.

## 2024-07-10 DIAGNOSIS — F411 Generalized anxiety disorder: Secondary | ICD-10-CM | POA: Diagnosis not present

## 2024-07-10 DIAGNOSIS — E7800 Pure hypercholesterolemia, unspecified: Secondary | ICD-10-CM | POA: Diagnosis not present

## 2024-07-10 DIAGNOSIS — E039 Hypothyroidism, unspecified: Secondary | ICD-10-CM | POA: Diagnosis not present

## 2024-07-10 DIAGNOSIS — F321 Major depressive disorder, single episode, moderate: Secondary | ICD-10-CM | POA: Diagnosis not present

## 2024-07-10 DIAGNOSIS — I10 Essential (primary) hypertension: Secondary | ICD-10-CM | POA: Diagnosis not present

## 2024-07-17 DIAGNOSIS — Z0000 Encounter for general adult medical examination without abnormal findings: Secondary | ICD-10-CM | POA: Diagnosis not present

## 2024-07-17 DIAGNOSIS — E7800 Pure hypercholesterolemia, unspecified: Secondary | ICD-10-CM | POA: Diagnosis not present

## 2024-07-17 DIAGNOSIS — F411 Generalized anxiety disorder: Secondary | ICD-10-CM | POA: Diagnosis not present

## 2024-07-17 DIAGNOSIS — L299 Pruritus, unspecified: Secondary | ICD-10-CM | POA: Diagnosis not present

## 2024-07-17 DIAGNOSIS — I10 Essential (primary) hypertension: Secondary | ICD-10-CM | POA: Diagnosis not present

## 2024-07-17 DIAGNOSIS — E039 Hypothyroidism, unspecified: Secondary | ICD-10-CM | POA: Diagnosis not present

## 2024-07-17 DIAGNOSIS — I779 Disorder of arteries and arterioles, unspecified: Secondary | ICD-10-CM | POA: Diagnosis not present

## 2024-07-17 DIAGNOSIS — I679 Cerebrovascular disease, unspecified: Secondary | ICD-10-CM | POA: Diagnosis not present

## 2024-07-17 DIAGNOSIS — Z1331 Encounter for screening for depression: Secondary | ICD-10-CM | POA: Diagnosis not present

## 2024-07-18 ENCOUNTER — Other Ambulatory Visit: Payer: Self-pay | Admitting: Family Medicine

## 2024-07-18 DIAGNOSIS — I779 Disorder of arteries and arterioles, unspecified: Secondary | ICD-10-CM

## 2024-07-20 DIAGNOSIS — I10 Essential (primary) hypertension: Secondary | ICD-10-CM | POA: Diagnosis not present

## 2024-07-25 LAB — AMB RESULTS CONSOLE CBG: Glucose: 96

## 2024-07-25 NOTE — Progress Notes (Signed)
Pt has PCP. No SDOH needs at this time

## 2024-08-01 ENCOUNTER — Ambulatory Visit: Admitting: Physician Assistant

## 2024-08-01 ENCOUNTER — Encounter: Payer: Self-pay | Admitting: Physician Assistant

## 2024-08-01 VITALS — BP 170/102 | HR 77 | Ht 64.0 in | Wt 148.0 lb

## 2024-08-01 DIAGNOSIS — I10 Essential (primary) hypertension: Secondary | ICD-10-CM

## 2024-08-01 DIAGNOSIS — R42 Dizziness and giddiness: Secondary | ICD-10-CM | POA: Diagnosis not present

## 2024-08-01 DIAGNOSIS — R350 Frequency of micturition: Secondary | ICD-10-CM

## 2024-08-01 DIAGNOSIS — F419 Anxiety disorder, unspecified: Secondary | ICD-10-CM

## 2024-08-01 DIAGNOSIS — R351 Nocturia: Secondary | ICD-10-CM

## 2024-08-01 DIAGNOSIS — E039 Hypothyroidism, unspecified: Secondary | ICD-10-CM

## 2024-08-01 LAB — POCT GLUCOSE FINGERSTICK: Glucose: 102 — AB (ref 70–99)

## 2024-08-01 NOTE — Patient Instructions (Signed)
 VISIT SUMMARY:  Today, we discussed your ongoing issues with dizziness, hypothyroidism, blood pressure variability, anxiety, and frequent urination. We reviewed your symptoms, current medications, and made plans for further evaluation and management.  YOUR PLAN:  -DIZZINESS AND GIDDINESS: You have been experiencing chronic dizziness and vertigo, which could be due to various reasons including medication side effects or inner ear issues. We will refer you to an ENT specialist for further evaluation.  -HYPOTHYROIDISM: Your thyroid  function appears stable on your current medication, but you still have symptoms like fatigue and feeling cold. We will continue with your current dose of levothyroxine.  -ANXIETY DISORDER: Your long-term anxiety is managed with clonazepam , but there are concerns about dependency and side effects. We discussed the possibility of transitioning to other medications.  -ESSENTIAL HYPERTENSION: Your blood pressure readings vary, often higher in medical settings and normal at home. This is known as white coat hypertension. Continue to monitor your blood pressure at home and take amlodipine  if it is elevated. Recheck your blood pressure after relaxing at home before taking the medication.  -NOCTURIA AND URINARY FREQUENCY: You experience frequent urination at night, which disrupts your sleep. This may be due to fluid intake before bed. We recommend reducing your fluid intake earlier in the evening and making sure to urinate before going to bed.  Clonazepam  Tablets What is this medication? CLONAZEPAM  (kloe NA ze pam) treats seizures. It is also used to treat panic disorder. It works by Education administrator system calm down. It belongs to a group of medications called benzodiazepines. This medicine may be used for other purposes; ask your health care provider or pharmacist if you have questions. COMMON BRAND NAME(S): Ceberclon, Klonopin  What should I tell my care team before I take  this medication? They need to know if you have any of these conditions: Glaucoma Kidney disease Liver disease Lung or breathing disease, such as asthma, COPD, sleep apnea Mental health conditions Porphyria Substance use disorder Suicidal thoughts, plans, or attempt by you or a family member Trouble swallowing An unusual or allergic reaction to clonazepam , other medications, foods, dyes, or preservatives Pregnant or trying to get pregnant Breastfeeding How should I use this medication? Take this medication by mouth with water. Take it as directed on the prescription label at the same time every day. You can take it with or without food. If it upsets your stomach, take it with food. Do not take it more often than directed. Keep taking it unless your care team tells you to stop. A special MedGuide will be given to you by the pharmacist with each prescription and refill. Be sure to read this information carefully each time. Talk to your care team about the use of this medication in children. Special care may be needed. People 65 years and older may have a stronger reaction and need a smaller dose. Overdosage: If you think you have taken too much of this medicine contact a poison control center or emergency room at once. NOTE: This medicine is only for you. Do not share this medicine with others. What if I miss a dose? If you miss a dose, take it as soon as you can. If it is almost time for your next dose, take only that dose. Do not take double or extra doses. What may interact with this medication? Do not take this medication with any of the following: Sodium oxybate This medication may also interact with the following: Alcohol Medications that cause drowsiness before a procedure, such as  propofol Medications that help you fall asleep Medications that relax muscles Opioids for pain or cough Other benzodiazepines Phenothiazines, such as chlorpromazine, prochlorperazine,  thioridazine Some antihistamines Some medications for depression, such as amitriptyline or trazodone  Some medications for seizures, such as phenobarbital or primidone Supplements, such as green tea, melatonin, St. John's wort, valerian Other medications may affect the way this medication works. Talk with your care team about all the medications you take. They may suggest changes to your treatment plan to lower the risk of side effects and to make sure your medications work as intended. This list may not describe all possible interactions. Give your health care provider a list of all the medicines, herbs, non-prescription drugs, or dietary supplements you use. Also tell them if you smoke, drink alcohol, or use illegal drugs. Some items may interact with your medicine. What should I watch for while using this medication? Visit your care team for regular checks on your progress. Tell your care team if your symptoms do not start to get better or if they get worse. There is a risk of abuse, misuse, and addiction with this medication. It is important to take this medication as directed by your care team. This medication may affect your coordination, reaction time, or judgment. Do not drive or operate machinery until you know how this medication affects you. Sit up or stand slowly to reduce the risk of dizzy or fainting spells. Drinking alcohol with this medication can increase the risk of these side effects. This medication is a CNS depressant. This is a type of medication or substance that slows down your brain and nervous system. Taking it with other CNS depressants can make you too sleepy. This can make it hard to breathe and stay awake. In some cases, it can cause coma and death. CNS depressants include opioids, benzodiazepines, muscle relaxants, medications for sleep, alcohol, and street drugs. Talk to your care team about all the medications, vitamins, and supplements you take. They can tell you what is  safe to take together. Call emergency services right away if you have slow or shallow breathing, feel dizzy or confused, or have trouble staying awake. Do not stop taking this medication or reduce your dose without first talking to your care team. If you have taken this medication for a long time or take a high dose, your body may rely on it. Stopping it suddenly may cause a severe reaction. Talk to your care team about how long you need to take this medication. When it is time to stop, the dose will be slowly lowered over time to reduce the risk of side effects. This medication may worsen depression and cause thoughts of suicide. This can happen at any time but is more common after first starting treatment and after a change in dose. Talk to your care team right away if you have changes in mood and behavior or thoughts of self-harm or suicide. They can help you. Talk to your care team if you may be pregnant. Serious fetal side effects can occur if you take this medication during pregnancy. Prolonged use of this medication during pregnancy can cause temporary withdrawal in a newborn. Talk to your care team before breastfeeding. Changes to your treatment plan may be needed. If you breastfeed while taking this medication, seek medical care right away if you notice the child has slow or noisy breathing, is unusually sleepy or not able to wake up, or is limp. What side effects may I notice from receiving  this medication? Side effects that you should report to your care team as soon as possible: Allergic reactions--skin rash, itching, hives, swelling of the face, lips, tongue, or throat CNS depression--slow or shallow breathing, shortness of breath, feeling faint, dizziness, confusion, trouble staying awake Thoughts of suicide or self-harm, worsening mood, feelings of depression Side effects that usually do not require medical attention (report these to your care team if they continue or are  bothersome): Dizziness Drowsiness Headache This list may not describe all possible side effects. Call your doctor for medical advice about side effects. You may report side effects to FDA at 1-800-FDA-1088. Where should I keep my medication? Keep out of the reach of children and pets. Store it out of sight in a safe place. Do not share it with others. Misuse of this medication is dangerous and against the law. Store at room temperature between 20 and 25 degrees C (68 and 77 degrees F). Get rid of any unused medication after the expiration date. This medication may cause harm and death if it is taken by other adults, children, or pets. It is important to get rid of the medication as soon as you no longer need it, or it is expired. To get rid of this medication: Take the medication to a take-back program. Check with your pharmacy or law enforcement to find a location. Follow the steps given to you by your pharmacy. You may be given a pre-paid mail-back envelope or disposal product to safely get rid of your medication. If other options are not available, check the package insert or medication guide to see if it should be flushed down the toilet or put in your trash at home. If you are not sure, ask your care team. If it is safe to put it in your trash, empty the medication out of the container. Mix it with cat litter, dirt, used coffee grounds, or another unwanted substance. Seal the mixture in a container, such as a plastic bag. Put it in the trash. NOTE: This sheet is a summary. It may not cover all possible information. If you have questions about this medicine, talk to your doctor, pharmacist, or health care provider.  2025 Elsevier/Gold Standard (2024-01-19 00:00:00)

## 2024-08-01 NOTE — Progress Notes (Unsigned)
 New Patient Office Visit  Subjective    [name], female    DOB: 1945-11-15  Age: 78 y.o. MRN: [id number]  CC:  Chief Complaint  Patient presents with  . Hypertension    She wants her BP checked and blood sugar. She states she has been dizzy for awhile now and reports this being related to her medication   Discussed the use of AI scribe software for clinical note transcription with the patient, who gave verbal consent to proceed.  History of Present Illness     Outpatient Encounter Medications as of 08/01/2024  Medication Sig  . amLODipine  (NORVASC ) 5 MG tablet Take 1 tablet (5 mg total) by mouth daily as needed (SBP>140).  SABRA ammonium lactate (LAC-HYDRIN) 12 % lotion Apply topically 2 (two) times daily.  . aspirin  EC 81 MG tablet Take 1 tablet (81 mg total) by mouth daily. Swallow whole.  . clonazePAM  (KLONOPIN ) 0.5 MG tablet Take 1 tablet (0.5 mg total) by mouth 2 (two) times daily as needed.  . atorvastatin  (LIPITOR) 10 MG tablet Take 1 tablet (10 mg total) by mouth daily. (Patient not taking: Reported on 08/01/2024)  . Cholecalciferol (VITAMIN D3) 10 MCG (400 UNIT) CAPS Take by mouth. (Patient not taking: Reported on 08/01/2024)  . CLENPIQ 10-3.5-12 MG-GM -GM/175ML SOLN Take by mouth. (Patient not taking: Reported on 08/01/2024)  . clopidogrel  (PLAVIX ) 75 MG tablet Take 1 tablet (75 mg total) by mouth daily.  . diazepam  (VALIUM ) 2 MG tablet Take 1 tab 30 minutes prior to MRI, may add an additional tab if needed  . Diclofenac  Sodium 3 % GEL Apply 4 g topically 3 (three) times daily as needed.  . fluticasone  (FLONASE ) 50 MCG/ACT nasal spray Use 2 spray(s) in each nostril once daily  . hydrOXYzine  (ATARAX ) 25 MG tablet Take 25 mg by mouth at bedtime.  . loratadine  (CLARITIN ) 10 MG tablet Take 1 tablet (10 mg total) by mouth daily as needed for allergies. Take 10 mg by mouth daily as needed for allergies.  . meclizine  (ANTIVERT ) 25 MG tablet TAKE 1 TABLET BY MOUTH  THREE TIMES DAILY AS NEEDED FOR DIZZINESS  . olopatadine (PATADAY) 0.1 % ophthalmic solution 1 drop 2 (two) times daily.  . omeprazole  (PRILOSEC) 20 MG capsule Take 20 mg by mouth 2 (two) times daily.  . permethrin  (ELIMITE ) 5 % cream Apply to affected area once  . polyethylene glycol powder (GLYCOLAX /MIRALAX ) 17 GM/SCOOP powder Take 17 g by mouth 2 (two) times daily as needed.  . sucralfate  (CARAFATE ) 1 g tablet Take 1 tablet (1 g total) by mouth 3 (three) times daily with meals.  . triamcinolone  cream (KENALOG ) 0.1 % Apply 1 Application topically 2 (two) times daily.   No facility-administered encounter medications on file as of 08/01/2024.    Past Medical History:  Diagnosis Date  . Allergic rhinitis 07/16/2007  . Carbon monoxide exposure   . Dizziness   . Dysphagia 03/02/2016  . Essential hypertension 07/16/2007  . First degree hemorrhoids 11/28/2020  . Generalized abdominal pain 11/28/2020  . GERD (gastroesophageal reflux disease) 10/12/2018  . Hemorrhage of rectum and anus 11/28/2020  . History of cervical cancer   . HTN (hypertension)   . Hyperlipidemia 06/26/2007  . Hyperthyroidism 04/21/2016   hx of thyroid  nodule  . Insomnia 06/20/2014  . Left anterior knee pain 09/30/2014  . Leg pain, bilateral 03/02/2016  . Major depressive disorder 07/16/2007  . Mild cognitive impairment with memory loss 05/14/2022  . Multiple  thyroid  nodules 12/30/2017  . Olecranon bursitis of left elbow 01/30/2016  . Primary osteoarthritis of both knees 10/20/2018  . RBBB   . Second degree hemorrhoids 11/28/2020  . Vitamin D  deficiency 03/19/2022  . Wheezing 11/17/2020    Past Surgical History:  Procedure Laterality Date  . ABDOMINAL HYSTERECTOMY    . WISDOM TOOTH EXTRACTION Bilateral     Family History  Problem Relation Age of Onset  . Cervical cancer Mother   . Hypertension Mother   . Hypertension Father   . Diabetes Sister   . Stroke Sister   . Cancer Sister        bone cancer   . Breast cancer Sister   . Heart disease Brother   . Diabetes Brother   . Stroke Maternal Grandmother   . Heart attack Son   . Cancer Other   . Autism spectrum disorder Other   . Thyroid  disease Neg Hx     Social History   Socioeconomic History  . Marital status: Divorced    Spouse name: Not on file  . Number of children: 3  . Years of education: 44  . Highest education level: Some college, no degree  Occupational History  . Occupation: Retired     Comment: sales/marketing  Tobacco Use  . Smoking status: Never  . Smokeless tobacco: Never  Vaping Use  . Vaping status: Never Used  Substance and Sexual Activity  . Alcohol use: No  . Drug use: No  . Sexual activity: Not Currently    Birth control/protection: Post-menopausal  Other Topics Concern  . Not on file  Social History Narrative   Hobbies: enjoys fishing    1 Daughter that lives locally    1 Son lives in [place]    3 [place]    1 Son deceased    Right handed    Lives at home alone   Social Drivers of Health   Financial Resource Strain: Low Risk  (03/25/2022)   Overall Financial Resource Strain (CARDIA)   . Difficulty of Paying Living Expenses: Not hard at all  Food Insecurity: No Food Insecurity (07/25/2024)   Hunger Vital Sign   . Worried About Programme researcher, broadcasting/film/video in the Last Year: Never true   . Ran Out of Food in the Last Year: Never true  Transportation Needs: No Transportation Needs (07/25/2024)   PRAPARE - Transportation   . Lack of Transportation (Medical): No   . Lack of Transportation (Non-Medical): No  Physical Activity: Insufficiently Active (03/25/2022)   Exercise Vital Sign   . Days of Exercise per Week: 3 days   . Minutes of Exercise per Session: 30 min  Stress: No Stress Concern Present (03/25/2022)   [employer] of Occupational Health - Occupational Stress Questionnaire   . Feeling of Stress : Not at all  Social Connections: Socially Isolated (03/25/2022)   Social Connection and  Isolation Panel   . Frequency of Communication with Friends and Family: Three times a week   . Frequency of Social Gatherings with Friends and Family: Three times a week   . Attends Religious Services: Never   . Active Member of Clubs or Organizations: No   . Attends Banker Meetings: Never   . Marital Status: Divorced  Catering manager Violence: Not At Risk (07/25/2024)   Humiliation, Afraid, Rape, and Kick questionnaire   . Fear of Current or Ex-Partner: No   . Emotionally Abused: No   . Physically Abused: No   . Sexually  Abused: No    ROS      Objective    LMP 06/30/2013   Physical Exam      Assessment & Plan:   Problem List Items Addressed This Visit   None   No follow-ups on file.   Kirk RAMAN Mayers, PA-C

## 2024-08-02 ENCOUNTER — Encounter: Payer: Self-pay | Admitting: Physician Assistant

## 2024-08-02 NOTE — Progress Notes (Signed)
 Pt attended 07/04/2024 screening event with BP of 140/93 and blood sugar was 101. Pt noted at event that she does have a PCP. At event pt did not indicate any SDOH needs. Pt also noted that she is not a smoker and listed Medicare as her insurance at the event.   Per chart review pt does have a PCP (Candace Smith; [place] [facility]), insurance, and is not a smoker. Pt's last appt with PCP was 07/17/2024. Pt is currently on medication for hypertension. Pt does not indicate any SDOH needs at this time.  No additional pt f/u to be scheduled at this time per health equity protocol.

## 2024-08-06 ENCOUNTER — Ambulatory Visit
Admission: RE | Admit: 2024-08-06 | Discharge: 2024-08-06 | Disposition: A | Discharge status: Home / Self Care | Source: Ambulatory Visit | Attending: Family Medicine | Admitting: Family Medicine

## 2024-08-06 DIAGNOSIS — R0989 Other specified symptoms and signs involving the circulatory and respiratory systems: Secondary | ICD-10-CM | POA: Diagnosis not present

## 2024-08-06 DIAGNOSIS — I779 Disorder of arteries and arterioles, unspecified: Secondary | ICD-10-CM

## 2024-08-10 DIAGNOSIS — E039 Hypothyroidism, unspecified: Secondary | ICD-10-CM | POA: Diagnosis not present

## 2024-08-10 DIAGNOSIS — I10 Essential (primary) hypertension: Secondary | ICD-10-CM | POA: Diagnosis not present

## 2024-08-10 DIAGNOSIS — F321 Major depressive disorder, single episode, moderate: Secondary | ICD-10-CM | POA: Diagnosis not present

## 2024-08-10 DIAGNOSIS — E7800 Pure hypercholesterolemia, unspecified: Secondary | ICD-10-CM | POA: Diagnosis not present

## 2024-08-10 DIAGNOSIS — F411 Generalized anxiety disorder: Secondary | ICD-10-CM | POA: Diagnosis not present

## 2024-08-19 DIAGNOSIS — I10 Essential (primary) hypertension: Secondary | ICD-10-CM | POA: Diagnosis not present

## 2024-08-26 ENCOUNTER — Encounter (HOSPITAL_COMMUNITY): Payer: Self-pay

## 2024-08-26 ENCOUNTER — Emergency Department (HOSPITAL_COMMUNITY)
Admission: EM | Admit: 2024-08-26 | Discharge: 2024-08-26 | Disposition: A | Discharge status: Home / Self Care | Attending: Emergency Medicine | Admitting: Emergency Medicine

## 2024-08-26 ENCOUNTER — Emergency Department (HOSPITAL_COMMUNITY)

## 2024-08-26 ENCOUNTER — Other Ambulatory Visit: Payer: Self-pay

## 2024-08-26 DIAGNOSIS — Z7982 Long term (current) use of aspirin: Secondary | ICD-10-CM | POA: Diagnosis not present

## 2024-08-26 DIAGNOSIS — R42 Dizziness and giddiness: Secondary | ICD-10-CM | POA: Insufficient documentation

## 2024-08-26 DIAGNOSIS — I6782 Cerebral ischemia: Secondary | ICD-10-CM | POA: Insufficient documentation

## 2024-08-26 DIAGNOSIS — Z79899 Other long term (current) drug therapy: Secondary | ICD-10-CM | POA: Diagnosis not present

## 2024-08-26 DIAGNOSIS — I10 Essential (primary) hypertension: Secondary | ICD-10-CM | POA: Diagnosis not present

## 2024-08-26 DIAGNOSIS — Z8541 Personal history of malignant neoplasm of cervix uteri: Secondary | ICD-10-CM | POA: Insufficient documentation

## 2024-08-26 DIAGNOSIS — Z8673 Personal history of transient ischemic attack (TIA), and cerebral infarction without residual deficits: Secondary | ICD-10-CM

## 2024-08-26 DIAGNOSIS — E039 Hypothyroidism, unspecified: Secondary | ICD-10-CM | POA: Diagnosis not present

## 2024-08-26 DIAGNOSIS — I6381 Other cerebral infarction due to occlusion or stenosis of small artery: Secondary | ICD-10-CM | POA: Diagnosis not present

## 2024-08-26 LAB — URINALYSIS, ROUTINE W REFLEX MICROSCOPIC
Bacteria, UA: NONE SEEN
Bilirubin Urine: NEGATIVE
Glucose, UA: NEGATIVE mg/dL
Ketones, ur: NEGATIVE mg/dL
Leukocytes,Ua: NEGATIVE
Nitrite: NEGATIVE
Protein, ur: NEGATIVE mg/dL
Specific Gravity, Urine: 1.009 (ref 1.005–1.030)
pH: 6 (ref 5.0–8.0)

## 2024-08-26 LAB — COMPREHENSIVE METABOLIC PANEL WITH GFR
ALT: 27 U/L (ref 0–44)
AST: 31 U/L (ref 15–41)
Albumin: 3.9 g/dL (ref 3.5–5.0)
Alkaline Phosphatase: 55 U/L (ref 38–126)
Anion gap: 9 (ref 5–15)
BUN: 22 mg/dL (ref 8–23)
CO2: 24 mmol/L (ref 22–32)
Calcium: 8.9 mg/dL (ref 8.9–10.3)
Chloride: 108 mmol/L (ref 98–111)
Creatinine, Ser: 1.13 mg/dL — ABNORMAL HIGH (ref 0.44–1.00)
GFR, Estimated: 49 mL/min — ABNORMAL LOW (ref >60)
Glucose, Bld: 92 mg/dL (ref 70–99)
Potassium: 3.5 mmol/L (ref 3.5–5.1)
Sodium: 141 mmol/L (ref 135–145)
Total Bilirubin: 0.8 mg/dL (ref 0.0–1.2)
Total Protein: 7 g/dL (ref 6.5–8.1)

## 2024-08-26 LAB — CBC
HCT: 33.4 % — ABNORMAL LOW (ref 36.0–46.0)
Hemoglobin: 11.1 g/dL — ABNORMAL LOW (ref 12.0–15.0)
MCH: 29.8 pg (ref 26.0–34.0)
MCHC: 33.2 g/dL (ref 30.0–36.0)
MCV: 89.8 fL (ref 80.0–100.0)
Platelets: 295 K/uL (ref 150–400)
RBC: 3.72 MIL/uL — ABNORMAL LOW (ref 3.87–5.11)
RDW: 13.9 % (ref 11.5–15.5)
WBC: 5.7 K/uL (ref 4.0–10.5)
nRBC: 0 % (ref 0.0–0.2)

## 2024-08-26 LAB — CBG MONITORING, ED: Glucose-Capillary: 103 mg/dL — ABNORMAL HIGH (ref 70–99)

## 2024-08-26 MED ORDER — OXYBUTYNIN CHLORIDE ER 15 MG PO TB24: 15.0000 mg | ORAL_TABLET | Freq: Every day | ORAL | 0 refills | Status: AC

## 2024-08-26 MED ORDER — DOCUSATE SODIUM 100 MG PO CAPS: 100.0000 mg | ORAL_CAPSULE | Freq: Once | ORAL | Status: DC

## 2024-08-26 MED ORDER — DOCUSATE SODIUM 50 MG/5ML PO LIQD
100.0000 mg | Freq: Once | ORAL | Status: AC
  Administered 2024-08-26: 100 mg
  Filled 2024-08-26: qty 10

## 2024-08-26 MED ORDER — ONDANSETRON HCL 4 MG/2ML IJ SOLN
4.0000 mg | Freq: Once | INTRAMUSCULAR | Status: AC
  Administered 2024-08-26: 4 mg via INTRAVENOUS
  Filled 2024-08-26: qty 2

## 2024-08-26 MED ORDER — LORAZEPAM 2 MG/ML IJ SOLN
0.5000 mg | Freq: Once | INTRAMUSCULAR | Status: AC
  Administered 2024-08-26: 0.5 mg via INTRAVENOUS
  Filled 2024-08-26: qty 1

## 2024-08-26 MED ORDER — LORAZEPAM 0.5 MG PO TABS: 0.5000 mg | ORAL_TABLET | Freq: Two times a day (BID) | ORAL | 0 refills | Status: AC | PRN

## 2024-08-26 MED ORDER — SODIUM CHLORIDE 0.9 % IV BOLUS
500.0000 mL | Freq: Once | INTRAVENOUS | Status: AC
  Administered 2024-08-26: 500 mL via INTRAVENOUS

## 2024-08-26 NOTE — ED Provider Notes (Signed)
  EMERGENCY DEPARTMENT AT [facility] Provider Note   CSN: [id number] Arrival date & time: 08/26/24  9153     Patient presents with: Otalgia and Dizziness   Eriona Odwyer is a 78 y.o. female.   Pt is a 78 yo female with pmhx significant for cervical cancer, htn, gerd, hypothyroidism, and anxiety.  Pt presents to the ED today with left ear pain and dizziness.  Pt has a rx for klonopin , but feels the new formulation is making her feel bad.  Pt denies n/v.  She has also felt dizzy since she started taking her synthroid.  She is also worried that she's urinating a lot.       Prior to Admission medications   Medication Sig Start Date End Date Taking? Authorizing Provider  amLODipine  (NORVASC ) 5 MG tablet Take 1 tablet (5 mg total) by mouth daily as needed (SBP>140). 08/06/22  Yes Miriam Norris, NP  ammonium lactate (LAC-HYDRIN) 12 % lotion Apply topically 2 (two) times daily. 10/13/22  Yes [provider]  aspirin  EC 81 MG tablet Take 1 tablet (81 mg total) by mouth daily. Swallow whole. 10/01/22  Yes Shafer, Jorene, NP  clonazePAM  (KLONOPIN ) 0.5 MG tablet Take 1 tablet (0.5 mg total) by mouth 2 (two) times daily as needed. 09/28/22  Yes Tabori, Katherine E, MD  Diclofenac  Sodium 3 % GEL Apply 4 g topically 3 (three) times daily as needed. 01/09/24  Yes Cardama, Raynell Moder, MD  fluticasone  (FLONASE ) 50 MCG/ACT nasal spray Use 2 spray(s) in each nostril once daily Patient taking differently: Place 1 spray into both nostrils as needed for allergies. 08/13/22  Yes Tabori, Katherine E, MD  levothyroxine (SYNTHROID) 75 MCG tablet Take 75 mcg by mouth daily before breakfast.   Yes [provider]  loratadine  (CLARITIN ) 10 MG tablet Take 1 tablet (10 mg total) by mouth daily as needed for allergies. Take 10 mg by mouth daily as needed for allergies. 10/26/21  Yes Tabori, Katherine E, MD  LORazepam  (ATIVAN ) 0.5 MG tablet Take 1 tablet (0.5 mg total) by mouth 2  (two) times daily as needed for anxiety (dizziness). 08/26/24  Yes Dean Clarity, MD  meclizine  (ANTIVERT ) 25 MG tablet TAKE 1 TABLET BY MOUTH THREE TIMES DAILY AS NEEDED FOR DIZZINESS 09/20/22  Yes Tabori, Katherine E, MD  olopatadine (PATADAY) 0.1 % ophthalmic solution Place 1 drop into both eyes daily.   Yes [provider]  oxybutynin (DITROPAN XL) 15 MG 24 hr tablet Take 1 tablet (15 mg total) by mouth at bedtime. 08/26/24  Yes Amazin Pincock, MD  Polyethylene Glycol 400 (BLINK TEARS OP) Place 1 drop into both eyes daily.   Yes [provider]    Allergies: Pneumococcal vaccine, Pneumococcal vaccines, and Influenza vaccines    Review of Systems  Neurological:  Positive for dizziness.  All other systems reviewed and are negative.   Updated Vital Signs BP (!) 160/101   Pulse (!) 57   Temp 98 F (36.7 C) (Oral)   Resp 16   Ht 5' 4.5 (1.638 m)   Wt 67.1 kg   LMP 06/30/2013   SpO2 100%   BMI 25.01 kg/m   Physical Exam Vitals and nursing note reviewed.  Constitutional:      Appearance: Normal appearance.  HENT:     Head: Normocephalic and atraumatic.     Right Ear: External ear normal.     Left Ear: External ear normal.     Nose: Nose normal.  Mouth/Throat:     Mouth: Mucous membranes are moist.     Pharynx: Oropharynx is clear.  Eyes:     Extraocular Movements: Extraocular movements intact.     Conjunctiva/sclera: Conjunctivae normal.     Pupils: Pupils are equal, round, and reactive to light.  Cardiovascular:     Rate and Rhythm: Normal rate and regular rhythm.     Pulses: Normal pulses.     Heart sounds: Normal heart sounds.  Pulmonary:     Effort: Pulmonary effort is normal.     Breath sounds: Normal breath sounds.  Abdominal:     General: Abdomen is flat. Bowel sounds are normal.     Palpations: Abdomen is soft.  Musculoskeletal:        General: Normal range of motion.     Cervical back: Normal range of motion and neck supple.   Skin:    General: Skin is warm.     Capillary Refill: Capillary refill takes less than 2 seconds.  Neurological:     General: No focal deficit present.     Mental Status: She is alert and oriented to person, place, and time.  Psychiatric:        Mood and Affect: Mood normal.        Behavior: Behavior normal.     (all labs ordered are listed, but only abnormal results are displayed) Labs Reviewed  COMPREHENSIVE METABOLIC PANEL WITH GFR - Abnormal; Notable for the following components:      Result Value   Creatinine, Ser 1.13 (*)    GFR, Estimated 49 (*)    All other components within normal limits  CBC - Abnormal; Notable for the following components:   RBC 3.72 (*)    Hemoglobin 11.1 (*)    HCT 33.4 (*)    All other components within normal limits  URINALYSIS, ROUTINE W REFLEX MICROSCOPIC - Abnormal; Notable for the following components:   Color, Urine STRAW (*)    Hgb urine dipstick MODERATE (*)    All other components within normal limits  CBG MONITORING, ED - Abnormal; Notable for the following components:   Glucose-Capillary 103 (*)    All other components within normal limits  TSH    EKG: None  Radiology: CT Head Wo Contrast Result Date: 08/26/2024 EXAM: CT HEAD WITHOUT CONTRAST 08/26/2024 01:14:41 PM TECHNIQUE: CT of the head was performed without the administration of intravenous contrast. Automated exposure control, iterative reconstruction, and/or weight based adjustment of the mA/kV was utilized to reduce the radiation dose to as low as reasonably achievable. COMPARISON: Comparison is made to a prior study dated 08/21/2023, performed for dizziness and left ear pain. CLINICAL HISTORY: Neuro deficit, acute, stroke suspected. FINDINGS: BRAIN AND VENTRICLES: No acute hemorrhage. No evidence of acute infarct. No hydrocephalus. No extra-axial collection. No mass effect or midline shift. Scattered subcortical white matter hypoattenuation is mildly advanced for age, stable  from the prior study. Mild lacunar infarcts are present in the inferior posterior cerebellum bilaterally. ORBITS: No acute abnormality. SINUSES: No acute abnormality. SOFT TISSUES AND SKULL: No acute soft tissue abnormality. No skull fracture. IMPRESSION: 1. No acute intracranial abnormality. 2. Mild lacunar infarcts in the inferior posterior cerebellum bilaterally. 3. Mildly advanced scattered subcortical white matter hypoattenuation, stable from the prior study. This most likely reflects the sequelae of chronic microvascular ischemia. Electronically signed by: Lonni Necessary MD 08/26/2024 01:28 PM EST RP Workstation: [id number]     Procedures   Medications Ordered in the ED  docusate (COLACE)  50 MG/5ML liquid 100 mg (100 mg Per Tube Given 08/26/24 1157)  sodium chloride  0.9 % bolus 500 mL (0 mLs Intravenous Stopped 08/26/24 1415)  ondansetron (ZOFRAN) injection 4 mg (4 mg Intravenous Given 08/26/24 1157)  LORazepam  (ATIVAN ) injection 0.5 mg (0.5 mg Intravenous Given 08/26/24 1157)                                    Medical Decision Making Amount and/or Complexity of Data Reviewed Labs: ordered. Radiology: ordered.  Risk OTC drugs. Prescription drug management.   This patient presents to the ED for concern of dizziness, this involves an extensive number of treatment options, and is a complaint that carries with it a high risk of complications and morbidity.  The differential diagnosis includes vertigo, electrolyte abn, anemia   Co morbidities that complicate the patient evaluation  cervical cancer, htn, gerd, hypothyroidism, and anxiety   Additional history obtained:  Additional history obtained from epic chart review  Lab Tests:  I Ordered, and personally interpreted labs.  The pertinent results include:  cbc with hgb sl low at 11.1 (stable); cmp nl; UA nl other than mod hgb   Imaging Studies ordered:  I ordered imaging studies including ct head  I independently  visualized and interpreted imaging which showed  . No acute intracranial abnormality.  2. Mild lacunar infarcts in the inferior posterior cerebellum bilaterally.  3. Mildly advanced scattered subcortical white matter hypoattenuation, stable  from the prior study. This most likely reflects the sequelae of chronic  microvascular ischemia.   I agree with the radiologist interpretation   Cardiac Monitoring:  The patient was maintained on a cardiac monitor.  I personally viewed and interpreted the cardiac monitored which showed an underlying rhythm of: nsr   Medicines ordered and prescription drug management:  I ordered medication including ivfs/ativan /zofran  for sx  Reevaluation of the patient after these medicines showed that the patient improved I have reviewed the patients home medicines and have made adjustments as needed   Test Considered:  mri   Critical Interventions:  ct   Consultations Obtained:  I requested consultation with the neurologist (Dr. Michaela),  and discussed lab and imaging findings as well as pertinent plan - he recommended getting a MRI if there are any sx that are new; otherwise, she can f/u with neuro as an outpatient    Problem List / ED Course:  Dizziness:  pt feels like sx are old.  She does not feel that anything is new.  She did not want to get a MRI.  She is referred to neuro.  She is told to start ASA daily.  I will switch her from Klonopin  to Ativan .   Urinary frequency:  likely OAB   Reevaluation:  After the interventions noted above, I reevaluated the patient and found that they have :improved   Social Determinants of Health:  Lives at home   Dispostion:  After consideration of the diagnostic results and the patients response to treatment, I feel that the patent would benefit from discharge with outpatient f/u.       Final diagnoses:  Dizziness  Chronic cerebrovascular accident (CVA)    ED Discharge Orders           Ordered    oxybutynin (DITROPAN XL) 15 MG 24 hr tablet  Daily at bedtime        08/26/24 1452    LORazepam  (ATIVAN )  0.5 MG tablet  2 times daily PRN        08/26/24 1453               Terrianna Holsclaw, MD 08/26/24 1454

## 2024-08-26 NOTE — ED Triage Notes (Signed)
 Pt c.o left ear pain x3 days. Denies fever/chills.  Pt also c.o dizziness x 5 months. Thinks it could be related to her synthroid.  Pt also c.o frequent urination x 1 month. Denies burning with urination.

## 2024-08-26 NOTE — Discharge Instructions (Signed)
 Take ASA 325 mg daily

## 2024-09-03 DIAGNOSIS — F411 Generalized anxiety disorder: Secondary | ICD-10-CM | POA: Diagnosis not present

## 2024-09-03 DIAGNOSIS — E039 Hypothyroidism, unspecified: Secondary | ICD-10-CM | POA: Diagnosis not present

## 2024-09-03 DIAGNOSIS — I10 Essential (primary) hypertension: Secondary | ICD-10-CM | POA: Diagnosis not present

## 2024-09-03 DIAGNOSIS — R42 Dizziness and giddiness: Secondary | ICD-10-CM | POA: Diagnosis not present

## 2024-09-03 DIAGNOSIS — I679 Cerebrovascular disease, unspecified: Secondary | ICD-10-CM | POA: Diagnosis not present

## 2024-09-03 NOTE — Progress Notes (Signed)
 Pt attended 07/25/2024 screening event with BP of 158/102 and fasting blood sugar was 96. Pt noted at event that she does have a PCP which is Dr. Alberta Sharps, MD. At event pt did not indicate any SDOH needs. Pt also noted that she is not a smoker and listed Medicare as her insurance at the event.   Per chart review pt does have a PCP (Candace Smith; [facility]), insurance [facility] Medicare), and is not a smoker. Pt's last appt with PCP was 07/17/2024. Pt is currently on medication for hypertension. Chart review also indicates a future appt with pcp on 09/03/2024. No additional Health equity team support indicated at this time.

## 2024-09-09 DIAGNOSIS — E7800 Pure hypercholesterolemia, unspecified: Secondary | ICD-10-CM | POA: Diagnosis not present

## 2024-09-09 DIAGNOSIS — F411 Generalized anxiety disorder: Secondary | ICD-10-CM | POA: Diagnosis not present

## 2024-09-09 DIAGNOSIS — E039 Hypothyroidism, unspecified: Secondary | ICD-10-CM | POA: Diagnosis not present

## 2024-09-09 DIAGNOSIS — F321 Major depressive disorder, single episode, moderate: Secondary | ICD-10-CM | POA: Diagnosis not present

## 2024-09-19 LAB — AMB RESULTS CONSOLE CBG: Glucose: 112

## 2024-09-19 NOTE — Progress Notes (Signed)
 Patient informed me that did not take bp med, I encouraged her to take them when she gets home

## 2024-10-05 NOTE — Progress Notes (Signed)
 The patient attended a screening event on 09/19/2024 where her screening results were a BP of 154/90 and a blood glucose of 112. Per chart review the patient has Alberta Sharps listed as her PCP, has [employer] for insurance, and is not a smoker. Chart review also indicates the patient does not have any SDOH needs.   [facility] was able to reach patient for post initial follow up. Patient stated she still sees Dr. Alberta Sharps as her PCP and just had a physical this year. Patient also states she still has [facility] and does not have any SDOH needs at this time. No additional Health equity team support indicated at this time.
